# Patient Record
Sex: Female | Born: 1955 | Race: Black or African American | Hispanic: No | Marital: Single | Smoking: Current every day smoker
Health system: Southern US, Community
[De-identification: ages and names within clinical notes are randomized; demographics above are authoritative.]

## PROBLEM LIST (undated history)

## (undated) DIAGNOSIS — Q860 Fetal alcohol syndrome (dysmorphic): Secondary | ICD-10-CM

## (undated) DIAGNOSIS — K859 Acute pancreatitis without necrosis or infection, unspecified: Secondary | ICD-10-CM

## (undated) DIAGNOSIS — I10 Essential (primary) hypertension: Secondary | ICD-10-CM

## (undated) DIAGNOSIS — T7840XA Allergy, unspecified, initial encounter: Secondary | ICD-10-CM

## (undated) DIAGNOSIS — F319 Bipolar disorder, unspecified: Secondary | ICD-10-CM

## (undated) DIAGNOSIS — IMO0002 Reserved for concepts with insufficient information to code with codable children: Secondary | ICD-10-CM

## (undated) DIAGNOSIS — K219 Gastro-esophageal reflux disease without esophagitis: Secondary | ICD-10-CM

## (undated) DIAGNOSIS — F1910 Other psychoactive substance abuse, uncomplicated: Secondary | ICD-10-CM

## (undated) DIAGNOSIS — F419 Anxiety disorder, unspecified: Secondary | ICD-10-CM

## (undated) HISTORY — DX: Gastro-esophageal reflux disease without esophagitis: K21.9

## (undated) HISTORY — DX: Reserved for concepts with insufficient information to code with codable children: IMO0002

## (undated) HISTORY — DX: Other psychoactive substance abuse, uncomplicated: F19.10

## (undated) HISTORY — DX: Bipolar disorder, unspecified: F31.9

## (undated) HISTORY — DX: Allergy, unspecified, initial encounter: T78.40XA

## (undated) HISTORY — PX: NO PAST SURGERIES: SHX2092

## (undated) HISTORY — DX: Anxiety disorder, unspecified: F41.9

---
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
## 2003-02-25 ENCOUNTER — Encounter: Payer: Self-pay | Admitting: Emergency Medicine

## 2003-02-25 ENCOUNTER — Inpatient Hospital Stay (HOSPITAL_COMMUNITY): Admission: AD | Admit: 2003-02-25 | Discharge: 2003-02-25 | Payer: Self-pay | Admitting: *Deleted

## 2003-03-01 ENCOUNTER — Emergency Department (HOSPITAL_COMMUNITY): Admission: EM | Admit: 2003-03-01 | Discharge: 2003-03-01 | Payer: Self-pay | Admitting: Emergency Medicine

## 2005-06-20 ENCOUNTER — Emergency Department (HOSPITAL_COMMUNITY): Admission: EM | Admit: 2005-06-20 | Discharge: 2005-06-20 | Payer: Self-pay | Admitting: Emergency Medicine

## 2006-09-07 ENCOUNTER — Ambulatory Visit (HOSPITAL_COMMUNITY): Admission: RE | Admit: 2006-09-07 | Discharge: 2006-09-07 | Payer: Self-pay | Admitting: Internal Medicine

## 2006-09-07 ENCOUNTER — Ambulatory Visit: Payer: Self-pay | Admitting: Internal Medicine

## 2006-09-14 ENCOUNTER — Ambulatory Visit: Payer: Self-pay | Admitting: Internal Medicine

## 2006-12-21 ENCOUNTER — Ambulatory Visit: Payer: Self-pay | Admitting: Internal Medicine

## 2007-02-22 ENCOUNTER — Ambulatory Visit: Payer: Self-pay | Admitting: Internal Medicine

## 2007-08-02 DIAGNOSIS — G44209 Tension-type headache, unspecified, not intractable: Secondary | ICD-10-CM | POA: Insufficient documentation

## 2007-08-02 DIAGNOSIS — I10 Essential (primary) hypertension: Secondary | ICD-10-CM | POA: Insufficient documentation

## 2007-08-02 HISTORY — DX: Tension-type headache, unspecified, not intractable: G44.209

## 2007-12-11 ENCOUNTER — Ambulatory Visit: Payer: Self-pay | Admitting: Family Medicine

## 2007-12-13 ENCOUNTER — Ambulatory Visit: Payer: Self-pay | Admitting: Internal Medicine

## 2008-07-13 ENCOUNTER — Emergency Department (HOSPITAL_COMMUNITY): Admission: EM | Admit: 2008-07-13 | Discharge: 2008-07-13 | Payer: Self-pay | Admitting: Emergency Medicine

## 2008-08-19 ENCOUNTER — Telehealth (INDEPENDENT_AMBULATORY_CARE_PROVIDER_SITE_OTHER): Payer: Self-pay | Admitting: Internal Medicine

## 2008-08-19 ENCOUNTER — Encounter (INDEPENDENT_AMBULATORY_CARE_PROVIDER_SITE_OTHER): Payer: Self-pay | Admitting: Internal Medicine

## 2008-09-09 ENCOUNTER — Encounter (INDEPENDENT_AMBULATORY_CARE_PROVIDER_SITE_OTHER): Payer: Self-pay | Admitting: *Deleted

## 2009-08-02 ENCOUNTER — Emergency Department (HOSPITAL_COMMUNITY): Admission: EM | Admit: 2009-08-02 | Discharge: 2009-08-03 | Payer: Self-pay | Admitting: Emergency Medicine

## 2009-08-25 ENCOUNTER — Ambulatory Visit: Payer: Self-pay | Admitting: Internal Medicine

## 2009-08-25 DIAGNOSIS — K859 Acute pancreatitis without necrosis or infection, unspecified: Secondary | ICD-10-CM | POA: Insufficient documentation

## 2009-08-25 DIAGNOSIS — N390 Urinary tract infection, site not specified: Secondary | ICD-10-CM

## 2009-08-26 ENCOUNTER — Telehealth (INDEPENDENT_AMBULATORY_CARE_PROVIDER_SITE_OTHER): Payer: Self-pay | Admitting: Internal Medicine

## 2009-09-25 ENCOUNTER — Ambulatory Visit: Payer: Self-pay | Admitting: Internal Medicine

## 2009-10-26 ENCOUNTER — Ambulatory Visit: Payer: Self-pay | Admitting: Internal Medicine

## 2009-10-26 LAB — CONVERTED CEMR LAB
BUN: 13 mg/dL (ref 6–23)
CO2: 25 meq/L (ref 19–32)
Glucose, Bld: 70 mg/dL (ref 70–99)
Potassium: 4.2 meq/L (ref 3.5–5.3)
Sodium: 142 meq/L (ref 135–145)

## 2009-11-24 ENCOUNTER — Ambulatory Visit: Payer: Self-pay | Admitting: Internal Medicine

## 2009-11-24 DIAGNOSIS — M25519 Pain in unspecified shoulder: Secondary | ICD-10-CM

## 2009-11-24 LAB — CONVERTED CEMR LAB
ALT: 8 units/L (ref 0–35)
Albumin: 4.6 g/dL (ref 3.5–5.2)
Alkaline Phosphatase: 80 units/L (ref 39–117)
BUN: 10 mg/dL (ref 6–23)
CO2: 29 meq/L (ref 19–32)
Creatinine, Ser: 0.87 mg/dL (ref 0.40–1.20)
Glucose, Bld: 87 mg/dL (ref 70–99)
HCT: 43 % (ref 36.0–46.0)
Hemoglobin: 14.2 g/dL (ref 12.0–15.0)
Ketones, urine, test strip: NEGATIVE
LDL Cholesterol: 101 mg/dL — ABNORMAL HIGH (ref 0–99)
Lymphs Abs: 3 10*3/uL (ref 0.7–4.0)
Monocytes Absolute: 1 10*3/uL (ref 0.1–1.0)
Monocytes Relative: 8 % (ref 3–12)
Neutro Abs: 8.3 10*3/uL — ABNORMAL HIGH (ref 1.7–7.7)
Neutrophils Relative %: 67 % (ref 43–77)
Nitrite: NEGATIVE
Platelets: 180 10*3/uL (ref 150–400)
Potassium: 3.7 meq/L (ref 3.5–5.3)
Protein, U semiquant: 30
RBC: 4.67 M/uL (ref 3.87–5.11)
RDW: 14 % (ref 11.5–15.5)
Specific Gravity, Urine: <1.005
Total Bilirubin: 0.7 mg/dL (ref 0.3–1.2)
Total CHOL/HDL Ratio: 3.2
Total Protein: 7.6 g/dL (ref 6.0–8.3)
Urobilinogen, UA: 0.2
VLDL: 14 mg/dL (ref 0–40)
WBC Urine, dipstick: NEGATIVE
WBC: 12.4 10*3/uL — ABNORMAL HIGH (ref 4.0–10.5)
pH: 6.5

## 2009-11-30 ENCOUNTER — Ambulatory Visit (HOSPITAL_COMMUNITY): Admission: RE | Admit: 2009-11-30 | Discharge: 2009-11-30 | Payer: Self-pay | Admitting: Internal Medicine

## 2009-12-08 ENCOUNTER — Encounter (INDEPENDENT_AMBULATORY_CARE_PROVIDER_SITE_OTHER): Payer: Self-pay | Admitting: Internal Medicine

## 2009-12-23 ENCOUNTER — Encounter (INDEPENDENT_AMBULATORY_CARE_PROVIDER_SITE_OTHER): Payer: Self-pay | Admitting: Internal Medicine

## 2009-12-24 ENCOUNTER — Other Ambulatory Visit: Admission: RE | Admit: 2009-12-24 | Discharge: 2009-12-24 | Payer: Self-pay | Admitting: Internal Medicine

## 2009-12-24 ENCOUNTER — Ambulatory Visit: Payer: Self-pay | Admitting: Internal Medicine

## 2009-12-24 DIAGNOSIS — F329 Major depressive disorder, single episode, unspecified: Secondary | ICD-10-CM

## 2009-12-24 DIAGNOSIS — D72829 Elevated white blood cell count, unspecified: Secondary | ICD-10-CM | POA: Insufficient documentation

## 2009-12-24 DIAGNOSIS — R3129 Other microscopic hematuria: Secondary | ICD-10-CM

## 2009-12-24 DIAGNOSIS — H547 Unspecified visual loss: Secondary | ICD-10-CM

## 2009-12-24 DIAGNOSIS — IMO0002 Reserved for concepts with insufficient information to code with codable children: Secondary | ICD-10-CM

## 2009-12-24 LAB — CONVERTED CEMR LAB
Bilirubin Urine: NEGATIVE
Glucose, Urine, Semiquant: NEGATIVE
Nitrite: NEGATIVE
OCCULT 2: NEGATIVE
Protein, U semiquant: >=300
Rapid HIV Screen: NEGATIVE
Urobilinogen, UA: 0.2
pH: 5

## 2009-12-25 ENCOUNTER — Ambulatory Visit (HOSPITAL_COMMUNITY): Admission: RE | Admit: 2009-12-25 | Discharge: 2009-12-25 | Payer: Self-pay | Admitting: Internal Medicine

## 2010-01-09 LAB — CONVERTED CEMR LAB
Eosinophils Relative: 2 % (ref 0–5)
GC Probe Amp, Genital: NEGATIVE
MCV: 91.2 fL (ref 78.0–100.0)
Monocytes Absolute: 0.6 10*3/uL (ref 0.1–1.0)
Monocytes Relative: 6 % (ref 3–12)
Neutro Abs: 6.8 10*3/uL (ref 1.7–7.7)
Neutrophils Relative %: 63 % (ref 43–77)
Pap Smear: NEGATIVE
Platelets: 186 10*3/uL (ref 150–400)
RDW: 14.1 % (ref 11.5–15.5)
RPR Ser Ql: REACTIVE — AB
RPR Titer: 1:16 {titer} — AB
T pallidum Antibodies (TP-PA): 35.2 — ABNORMAL HIGH (ref <1.0)
WBC: 10.8 10*3/uL — ABNORMAL HIGH (ref 4.0–10.5)

## 2010-01-11 ENCOUNTER — Ambulatory Visit: Payer: Self-pay | Admitting: Internal Medicine

## 2010-01-31 ENCOUNTER — Telehealth (INDEPENDENT_AMBULATORY_CARE_PROVIDER_SITE_OTHER): Payer: Self-pay | Admitting: Internal Medicine

## 2010-02-02 ENCOUNTER — Encounter (INDEPENDENT_AMBULATORY_CARE_PROVIDER_SITE_OTHER): Payer: Self-pay | Admitting: Internal Medicine

## 2010-02-08 ENCOUNTER — Ambulatory Visit: Payer: Self-pay | Admitting: Internal Medicine

## 2010-02-14 DIAGNOSIS — A539 Syphilis, unspecified: Secondary | ICD-10-CM | POA: Insufficient documentation

## 2010-02-14 LAB — CONVERTED CEMR LAB
Bilirubin Urine: NEGATIVE
Glucose, Urine, Semiquant: NEGATIVE
Ketones, urine, test strip: NEGATIVE
Nitrite: NEGATIVE
Specific Gravity, Urine: >=1.03
Urobilinogen, UA: 0.2

## 2010-02-15 ENCOUNTER — Encounter (INDEPENDENT_AMBULATORY_CARE_PROVIDER_SITE_OTHER): Payer: Self-pay | Admitting: Internal Medicine

## 2010-02-18 ENCOUNTER — Encounter (INDEPENDENT_AMBULATORY_CARE_PROVIDER_SITE_OTHER): Payer: Self-pay | Admitting: Internal Medicine

## 2010-02-24 ENCOUNTER — Ambulatory Visit (HOSPITAL_COMMUNITY): Admission: RE | Admit: 2010-02-24 | Discharge: 2010-02-24 | Payer: Self-pay | Admitting: Internal Medicine

## 2010-03-03 ENCOUNTER — Ambulatory Visit: Payer: Self-pay | Admitting: Internal Medicine

## 2010-03-04 ENCOUNTER — Encounter (INDEPENDENT_AMBULATORY_CARE_PROVIDER_SITE_OTHER): Payer: Self-pay | Admitting: Internal Medicine

## 2010-03-04 DIAGNOSIS — D4120 Neoplasm of uncertain behavior of unspecified ureter: Secondary | ICD-10-CM

## 2010-03-04 DIAGNOSIS — D4100 Neoplasm of uncertain behavior of unspecified kidney: Secondary | ICD-10-CM

## 2010-03-05 ENCOUNTER — Encounter (INDEPENDENT_AMBULATORY_CARE_PROVIDER_SITE_OTHER): Payer: Self-pay | Admitting: Internal Medicine

## 2010-03-23 ENCOUNTER — Ambulatory Visit: Payer: Self-pay | Admitting: Internal Medicine

## 2010-03-24 ENCOUNTER — Ambulatory Visit (HOSPITAL_COMMUNITY): Admission: RE | Admit: 2010-03-24 | Discharge: 2010-03-24 | Payer: Self-pay | Admitting: Internal Medicine

## 2010-03-24 ENCOUNTER — Encounter (INDEPENDENT_AMBULATORY_CARE_PROVIDER_SITE_OTHER): Payer: Self-pay | Admitting: Internal Medicine

## 2010-03-24 LAB — CONVERTED CEMR LAB: Creatinine, Ser: 0.95 mg/dL (ref 0.40–1.20)

## 2010-04-03 ENCOUNTER — Telehealth (INDEPENDENT_AMBULATORY_CARE_PROVIDER_SITE_OTHER): Payer: Self-pay | Admitting: Internal Medicine

## 2010-04-03 ENCOUNTER — Encounter (INDEPENDENT_AMBULATORY_CARE_PROVIDER_SITE_OTHER): Payer: Self-pay | Admitting: Internal Medicine

## 2010-06-03 ENCOUNTER — Telehealth (INDEPENDENT_AMBULATORY_CARE_PROVIDER_SITE_OTHER): Payer: Self-pay | Admitting: *Deleted

## 2010-07-01 ENCOUNTER — Ambulatory Visit: Payer: Self-pay | Admitting: Internal Medicine

## 2010-07-01 DIAGNOSIS — R1013 Epigastric pain: Secondary | ICD-10-CM

## 2010-07-01 LAB — CONVERTED CEMR LAB: Amylase: 63 units/L (ref 0–105)

## 2010-12-09 NOTE — Assessment & Plan Note (Signed)
Summary: STOMACH HURTS///KT   Vital Signs:  Patient profile:   55 year old female Height:      64 inches Weight:      103.6 pounds BMI:     17.85 O2 Sat:      98 % on Room air Temp:     98.4 degrees F oral Pulse rate:   59 / minute Pulse rhythm:   regular Resp:     18 per minute BP sitting:   142 / 100  (right arm) Cuff size:   regular  Vitals Entered By: Michelle Nasuti (July 01, 2010 9:58 AM)  O2 Flow:  Room air CC: pt presents to clinic for follow up on upper abd. pain. Pt states she was seen in ER in [facility] last week, Transvag pelvic ultrasound was done. pt unsure of results or discharging dx ?hematuria. Given Rx for abx which pt did complete Pain Assessment Patient in pain? no       Does patient need assistance? Functional Status Self care Ambulation Normal    CC:  pt presents to clinic for follow up on upper abd. pain. Pt states she was seen in ER in [facility] last week and Transvag pelvic ultrasound was done. pt unsure of results or discharging dx ?hematuria. Given Rx for abx which pt did complete.  History of Present Illness: 1.  Epigastric Pain:  pt.  states she was having pain starting 2 weeks ago--constant, pinching pain.  Pt. states she was seen by Urology at [facility] soon after.  Sounds like she had cystoscopy and was told everything was fine.  Was ultimately given an antibiotic and soon after starting antibiotic (pt. states just 3 hours after first dose)  her pain went away.  Pt states she is not aware of being diagnosed as having a UTI.  Pt. had similar pain previously with elevated lipase.  Has not had any pain since I believe 06/25/10--sounds like this is when she had the procedure and given one dose of antibiotic.  Pt. states pain was similar to that suffered in 08/2009--had mild elevation in Lipase at the time, but abdominal ultrasound did not show pancreatic abnormality, though suboptimal study of pancreas.  Has since had an abdominal MRI that showed  no pancreatic abnormality.  NIece also states pt. had nausea and vomiting with the abdominal pain.  Pt. states has heartburn every 2 weeks or so--not frequent.  Does smoke rarely.  No alcohol.   Does use aspirin--not recent.  Not much in way of onions or tomatoes.  Little caffeine.  Eats a lot of chocolate at times.  2.  Microscopic hematuria:  has repeat CT scan with [facility] on 12/29/10--most likely to follow up on right renal lesion.  Reportedly Urologic work up was fine per pt. and family.  3.  Hypertension:  not taking meds regularly.  Niece trying to get pt. to take--has marked bottles for her.  Would be willing to set up a pill box.  Allergies: 1)  * Tomatoes 2)  * Chocolate  Physical Exam  General:  NAD Lungs:  Normal respiratory effort, chest expands symmetrically. Lungs are clear to auscultation, no crackles or wheezes. Heart:  Normal rate and regular rhythm. S1 and S2 normal without gallop, murmur, click, rub or other extra sounds.  Radial pulses normal and equal Abdomen:  soft, normal bowel sounds, no masses, no hepatomegaly, and no splenomegaly.  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 to take meds Her updated medication list for this problem includes:    Metoprolol Tartrate 25 Mg Tabs (Metoprolol tartrate) .Marland Kitchen... 1 tab by mouth two times a day    Hydrochlorothiazide 25 Mg Tabs (Hydrochlorothiazide) .Marland Kitchen... 1 by mouth once daily  Complete Medication List: 1)  Metoprolol Tartrate 25 Mg Tabs (Metoprolol tartrate) .Marland Kitchen.. 1 tab by mouth  two times a day 2)  Hydrochlorothiazide 25 Mg Tabs (Hydrochlorothiazide) .Marland Kitchen.. 1 by mouth once daily 3)  Famotidine 40 Mg Tabs (Famotidine) .Marland Kitchen.. 1 tab by mouth at bedtime Prescriptions: HYDROCHLOROTHIAZIDE 25 MG TABS (HYDROCHLOROTHIAZIDE) 1 by mouth once daily  #30 x 11   Entered and Authorized by:   Julieanne Manson MD   Signed by:   Julieanne Manson MD on 07/01/2010   Method used:   [facility]ron[facility] to        [facility] [address] [id number]* (retail)       [address]       [place], [place]  [address]       Ph: [phone] or [phone]       Fax: [phone]   RxID:   [id number] METOPROLOL TARTRATE 25 MG  TABS (METOPROLOL TARTRATE) 1 tab by mouth two times a day  #60 x 11   Entered and Authorized by:   Julieanne Manson MD   Signed by:   Julieanne Manson MD on 07/01/2010   Method used:   [facility]        [facility] [address] [id number][facility] (retail)       [address]       [place], [place]  [address]       Ph: [phone] or [phone]       Fax: [phone]   RxID:   [id number] FAMOTIDINE 40 MG TABS (FAMOTIDINE) 1 tab by mouth at bedtime  #30 x 11   Entered and Authorized by:   Julieanne Manson MD   Signed by:   Julieanne Manson MD on 07/01/2010   Method used:   [facility]ronically to        [facility] [address] [id number]* (retail)       [address]       [place], [place]  [address]       Ph: [phone] or [phone]       Fax: [phone]   RxID:   [id number]   Prevention & Chronic Care Immunizations   Influenza vaccine: Not documented    Tetanus booster: 09/07/2006: Historical    Pneumococcal vaccine: Not documented  Colorectal Screening   Hemoccult: Not documented    Colonoscopy: Not documented  Other Screening   Pap smear: NEGATIVE FOR INTRAEPITHELIAL LESIONS OR MALIGNANCY.  (12/24/2009)    Mammogram: ASSESSMENT: Negative - BI-RADS 1^MM DIGITAL SCREENING   (12/25/2009)   Smoking status: current  (12/24/2009)  Lipids   Total Cholesterol: 168  (11/24/2009)   LDL: 101  (11/24/2009)   LDL Direct: Not documented   HDL: 53  (11/24/2009)   Triglycerides: 68  (11/24/2009)  Hypertension   Last Blood Pressure: 142 / 100  (07/01/2010)   Serum creatinine: 0.95  (03/24/2010)   Serum potassium 4.1  (03/24/2010)  Self-Management Support :    Hypertension self-management support: Not documented

## 2010-12-09 NOTE — Progress Notes (Signed)
Summary: Surgical Depatment  Phone Note From Other Clinic   Summary of Call: Cristine, from [facility]  just called because they need the actual film (MRI)  for abdomal and pelvis to be mail to Dpt or Urology [address]  [facility] [address] [place] [address] attention to  [place] or Zella Ball.  If you have any further question plaease do not hesitat to call back to christine at the [phone].  Mulberry MD  Initial call taken by: Manon Hilding,  June 03, 2010 9:16 AM  Follow-up for Phone Call        Sheila/Tiffany--just came across this before seeing pt--was this taken care of?  If so, please document  Follow-up by: Julieanne Manson MD,  July 01, 2010 10:01 AM  Additional Follow-up for Phone Call Additional follow up Details #1::        I do not know, first time I am seeing, will check with [facility] Additional Follow-up by: Vesta Mixer CMA,  July 01, 2010 10:16 AM    Additional Follow-up for Phone Call Additional follow up Details #2::    WE DON'T HAVE ABLE FILM SHE WILL NEED TO CALL WERE IMAGE WAS DONE Follow-up by: Arta Bruce,  July 19, 2010 11:20 AM

## 2010-12-09 NOTE — Letter (Signed)
Summary: [facility]//NO RECORDS FOUND  [facility]//NO RECORDS FOUND   Imported By: Arta Bruce 10/04/2010 16:28:52  _____________________________________________________________________  External Attachment:    Type:   Image     Comment:   External Document

## 2010-12-09 NOTE — Letter (Signed)
Summary: TEST ORDER FORM//ULTRASOUND//APPT DATE & TIME  TEST ORDER FORM//ULTRASOUND//APPT DATE & TIME   Imported By: Arta Bruce 01/06/2010 12:02:52  _____________________________________________________________________  External Attachment:    Type:   Image     Comment:   External Document

## 2010-12-09 NOTE — Assessment & Plan Note (Signed)
Summary: CPP/////KT/hemmocult card results   Vital Signs:  Patient profile:   55 year old female Weight:      104 pounds Temp:     97.5 degrees F Pulse rate:   76 / minute Pulse rhythm:   regular Resp:     20 per minute BP sitting:   139 / 86  (left arm) Cuff size:   regular  Vitals Entered By: Vesta Mixer CMA (December 24, 2009 10:49 AM)  History of Present Illness: 55 yo female here for CPP:  No definite concerns.  Habits & Providers  Alcohol-Tobacco-Diet     Alcohol drinks/day: <1--rare     Tobacco Status: current     Cigarette Packs/Day: 1/3 ppd     Year Started: age 22  Exercise-Depression-Behavior     Drug Use: crack cocaine--last used ealier this week.  States using ever now and then.  Allergies (verified): 1)  * Tomatoes 2)  * Chocolate  Past History:  Past Medical History: FETAL ALCOHOL SYNDROME (ICD-760.71) MICROSCOPIC HEMATURIA (ICD-599.72) SHOULDER PAIN, RIGHT (ICD-719.41) HEALTH SCREENING (ICD-V70.0) ENCOUNTER FOR LONG-TERM USE OF OTHER MEDICATIONS (ICD-V58.69) PANCREATITIS (ICD-577.0) URINARY TRACT INFECTION (ICD-599.0) HEADACHE, TENSION (ICD-307.81) HYPERTENSION (ICD-401.9)  Past Surgical History: None  Family History: Mother, died 77:  Alcoholism, died of breast cancer Father, late 2s, does not know him well. 2 Sisters, 34:  Hypertension and 38:  healthy as far as known 2 Brothers, 16:  incarcerated--unknown health,  70:  hypertension, DM Daughter, 64:  Healthy  Social History: Lives alone.   Never married Niece is power of attorney--takes care of finances Gets SSI --disabled secondary to MRPacks/Day:  1/3 ppd Drug Use:  crack cocaine--last used ealier this week.  States using ever now and then.  Review of Systems General:  Energy okay. Eyes:  Complains of blurring; Has not tried reading glasses.  Has not been to an eye doctor. ENT:  Denies decreased hearing. CV:  Denies chest pain or discomfort and palpitations. Resp:  Denies  shortness of breath; Occasionally gets SOB going up steps. GI:  Denies abdominal pain, bloody stools, constipation, dark tarry stools, and diarrhea. GU:  Complains of nocturia; denies dysuria; 3 times nightly. MS:  Denies joint pain, joint redness, and joint swelling. Derm:  Denies lesion(s) and rash. Neuro:  Denies numbness, tingling, and weakness. Psych:  irritable and angry at times--Niece worried she might be bipolar.  She is not aware of that running in the family.Marland Kitchen  Physical Exam  General:  Well-developed,well-nourished,in no acute distress; alert,appropriate and cooperative throughout examination Head:  Normocephalic and atraumatic without obvious abnormalities. No apparent alopecia or balding. Eyes:  No corneal or conjunctival inflammation noted. EOMI. Perrla. Funduscopic exam benign, without hemorrhages, exudates or papilledema. Vision grossly normal. Ears:  External ear exam shows no significant lesions or deformities.  Otoscopic examination reveals clear canals, tympanic membranes are intact bilaterally without bulging, retraction, inflammation or discharge. Hearing is grossly normal bilaterally. Nose:  External nasal examination shows no deformity or inflammation. Nasal mucosa are pink and moist without lesions or exudates. Mouth:  Oral mucosa and oropharynx without lesions or exudates. poor dentition--teeth loose and missing.   Neck:  No deformities, masses, or tenderness noted. Breasts:  No mass, nodules, thickening, tenderness, bulging, retraction, inflamation, nipple discharge or skin changes noted.   Lungs:  Normal respiratory effort, chest expands symmetrically. Lungs are clear to auscultation, no crackles or wheezes. Heart:  Normal rate and regular rhythm. S1 and S2 normal without gallop, murmur, click, rub or other  extra sounds. Abdomen:  Bowel sounds positive,abdomen soft and non-tender without masses, organomegaly or hernias noted. Rectal:  No external abnormalities noted.  Normal sphincter tone. No rectal masses or tenderness.  Heme negative light brown stool. Genitalia:  Pelvic Exam:        External: normal female genitalia without lesions or masses        Vagina: normal without lesions or masses        Cervix: normal without lesions or masses        Adnexa: normal bimanual exam without masses or fullness        Uterus: normal by palpation        Pap smear: performed Msk:  No deformity or scoliosis noted of thoracic or lumbar spine.   Pulses:  R and L carotid,radial,femoral,dorsalis pedis and posterior tibial pulses are full and equal bilaterally Extremities:  No clubbing, cyanosis, edema, or deformity noted with normal full range of motion of all joints.   Neurologic:  No cranial nerve deficits noted. Station and gait are normal. Plantar reflexes are down-going bilaterally. DTRs are symmetrical throughout. Sensory, motor and coordinative functions appear intact. Skin:  Intact without suspicious lesions or rashes Cervical Nodes:  No lymphadenopathy noted Axillary Nodes:  No palpable lymphadenopathy Inguinal Nodes:  No significant adenopathy Psych:  Cognition and judgment appear fairly intact. Alert and cooperative with normal attention span and concentration. No apparent delusions, illusions, hallucinations   Impression & Recommendations:  Problem # 1:  ROUTINE GYNECOLOGICAL EXAMINATION (ICD-V72.31) Mammogram scheduled Orders: KOH/ WET Mount [phone]) Pap Smear, Thin Prep ( Collection of) [phone]) UA Dipstick w/o Micro (automated)  (81003) T- GC Chlamydia (13086) T-Pap Smear, Thin Prep (57846) T-HIV Antibody  (Reflex) ([id number]) T-Syphilis Test (RPR) (24401-02725)  Problem # 2:  VISUAL ACUITY, DECREASED (ICD-369.9)  Orders: Ophthalmology Referral (Ophthalmology)  Problem # 3:  DEPRESSION (ICD-311) With cocaine abuse at times as well as anger issues Orders: Psychology Referral (Psychology)  [facility]  Problem # 4:  MICROSCOPIC HEMATURIA  (ICD-599.72)  The following medications were removed from the medication list:    Nitrofurantoin Macrocrystal 100 Mg Caps (Nitrofurantoin macrocrystal) .Marland Kitchen... 1 by mouth two times a day x 7 days  Orders: T-Culture, Urine ([id number])  Problem # 5:  HYPERTENSION (ICD-401.9) Fair control. Her updated medication list for this problem includes:    Metoprolol Tartrate 25 Mg Tabs (Metoprolol tartrate) .Marland Kitchen... 1 tab by mouth two times a day    Hydrochlorothiazide 25 Mg Tabs (Hydrochlorothiazide) .Marland Kitchen... 1 by mouth once daily  Complete Medication List: 1)  Metoprolol Tartrate 25 Mg Tabs (Metoprolol tartrate) .Marland Kitchen.. 1 tab by mouth two times a day 2)  Hydrochlorothiazide 25 Mg Tabs (Hydrochlorothiazide) .Marland Kitchen.. 1 by mouth once daily  Other Orders: T-CBC w/Diff ([id number])  Patient Instructions: 1)  Follow up with Dr. Delrae Alfred in 6 months--hypertension 2)  Referral to [facility] for depressiona and Crack cocaine use   Preventive Care Screening  Prior Values:    Last Tetanus Booster:  Historical (09/07/2006)     Pap:  Long time ago--cannot recall if ever abnormal. Mammogram:  scheduled for tomorrow. SBE:  Daily in shower Guaiac Cards:  Never. Colonscopy:  never. Osteoprevention:  States have 6 servings of cheese and milk daily.  Walks about 1 mile 2 times weekly.   Laboratory Results   Urine Tests    Routine Urinalysis   Glucose: negative   (Normal Range: Negative) Bilirubin: negative   (Normal Range: Negative) Ketone: trace (5)   (Normal Range: Negative)  Spec. Gravity: >=1.030   (Normal Range: 1.003-1.035) Blood: moderate   (Normal Range: Negative) pH: 5.0   (Normal Range: 5.0-8.0) Protein: >=300   (Normal Range: Negative) Urobilinogen: 0.2   (Normal Range: 0-1) Nitrite: negative   (Normal Range: Negative) Leukocyte Esterace: negative   (Normal Range: Negative)    Date/Time Received: December 29, 2009 5:30 PM   [employer] Source: vaginal WBC/hpf: 1-5 Bacteria/hpf:  2+ Clue cells/hpf: few  Positive whiff Yeast/hpf: none Wet Mount KOH: Negative Trichomonas/hpf: none  Other Tests  Rapid HIV: negative  Stool - Occult Blood Hemmoccult #1: negative Date: 12/29/2009 Hemoccult #2: negative Date: 12/29/2009 Hemoccult #3: negative Date: 12/29/2009 Comments: Whiff very faint    Laboratory Results   Urine Tests    Routine Urinalysis   Glucose: negative   (Normal Range: Negative) Bilirubin: negative   (Normal Range: Negative) Ketone: trace (5)   (Normal Range: Negative) Spec. Gravity: >=1.030   (Normal Range: 1.003-1.035) Blood: moderate   (Normal Range: Negative) pH: 5.0   (Normal Range: 5.0-8.0) Protein: >=300   (Normal Range: Negative) Urobilinogen: 0.2   (Normal Range: 0-1) Nitrite: negative   (Normal Range: Negative) Leukocyte Esterace: negative   (Normal Range: Negative)      Wet Mount/KOH  Positive whiff  Other Tests  Rapid HIV: negative Comments: Whiff very faint

## 2010-12-09 NOTE — Letter (Signed)
Summary: med/solutions/approved  med/solutions/approved   Imported By: Arta Bruce 04/21/2010 11:25:47  _____________________________________________________________________  External Attachment:    Type:   Image     Comment:   External Document

## 2010-12-09 NOTE — Letter (Signed)
Summary: *HSN Results Follow up  [facility]  [address] [place], [place] [address]   Phone: [phone]  Fax: [phone]      12/08/2009   [facility] [address] [place], [place]  [address]   Dear  [name],                            ____S.Drinkard,FNP   ____D. Gore,FNP       ____B. McPherson,MD   ____V. Rankins,MD    __X__E. Danyla Wattley,MD    ____N. Daphine Deutscher, FNP  ____D. Reche Dixon, MD    ____K. Philipp Deputy, MD    ____Other     This letter is to inform you that your recent test(s):  _______Pap Smear    _______Lab Test     ___X____X-ray    ____X___ is within acceptable limits  _______ requires a medication change  _______ requires a follow-up lab visit  _______ requires a follow-up visit with your provider   Comments:  Ultrasound was okay of your pancreas       _________________________________________________________ If you have any questions, please contact our office                     Sincerely,  Roberta Manson MD [facility]

## 2010-12-09 NOTE — Letter (Signed)
Summary: MED/SOLUTIONS//APPROVED  MED/SOLUTIONS//APPROVED   Imported By: Arta Bruce 02/08/2010 16:47:18  _____________________________________________________________________  External Attachment:    Type:   Image     Comment:   External Document

## 2010-12-09 NOTE — Assessment & Plan Note (Signed)
Summary: 3 MONTH FU////KT   Vital Signs:  Patient profile:   55 year old female Weight:      107.1 pounds Temp:     97.1 degrees F Pulse rate:   76 / minute Pulse rhythm:   regular Resp:     18 per minute BP sitting:   106 / 78  (left arm) Cuff size:   regular  Vitals Entered By: Vesta Mixer CMA (November 24, 2009 10:35 AM) CC: 3 month f/u, her right arm aches sometimes and wants to know if she should take some vitamins to help her gain weight.  She has been taking her metoprolol 2 qd instead of 1 bid  Is Patient Diabetic? No Pain Assessment Patient in pain? no       Does patient need assistance? Ambulation Normal   CC:  3 month f/u and her right arm aches sometimes and wants to know if she should take some vitamins to help her gain weight.  She has been taking her metoprolol 2 qd instead of 1 bid .  History of Present Illness: 1.  Hypertension:  Tolerating meds fine--no light headedness.  No fatigue.  Good energy.  Recent BMET was normal.  2.  Elevated Lipase in ED with epigastric pain--see previous note.  This info gleaned from ED report after pt. here last.  Pain has not recurred.  Pt. states this was the first episode of the pain.  Sharp pain in epigastrium--worse with eating--vomited.  No radiation, but worse when lay back.  Symptoms lasted for 1 week.  No change in BMs--color or consistency.  Pt. drinks alcohol only 3-4 times yearly--only with holidays.  Pt. states she was an "alcoholic baby"  -- mother was an alcoholic--including during pregnancy with pt--so she does not drink much.  Denies drinking at all before GI symptoms started.  3.  Right arm pain:  started last week.  Hurts mostly when awakens in morning--pt. sleeps on that side.  If does not sleep on that shoulder, does not hurt.  Cannot recall an injury.  May have overused, but pt. not clear as to how she may have.  Good grip with right hand.  No weakness, numbness or tingling in that arm.    Allergies: No Known  Drug Allergies  Physical Exam  General:  NAD, thin female Mouth:  poor dentition and teeth missing.   Lungs:  Normal respiratory effort, chest expands symmetrically. Lungs are clear to auscultation, no crackles or wheezes. Heart:  Normal rate and regular rhythm. S1 and S2 normal without gallop, murmur, click, rub or other extra sounds.  Radial pulses normal and equal Abdomen:  Bowel sounds positive,abdomen soft and non-tender without masses, organomegaly or hernias noted.   Impression & Recommendations:  Problem # 1:  HYPERTENSION (ICD-401.9) Controlled Her updated medication list for this problem includes:    Metoprolol Tartrate 25 Mg Tabs (Metoprolol tartrate) .Marland Kitchen... 1 tab by mouth two times a day    Hydrochlorothiazide 25 Mg Tabs (Hydrochlorothiazide) .Marland Kitchen... 1 by mouth once daily  Orders: T-CBC w/Diff ([id number]) UA Dipstick w/o Micro (manual) (09811)  Problem # 2:  PANCREATITIS (ICD-577.0) Need to check for etiology. No signs or symptoms of continued problem Orders: T-CBC w/Diff ([id number]) T-Comprehensive Metabolic Panel [phone]) T-Lipid Profile ([id number]) Ultrasound (Ultrasound)  Problem # 3:  SHOULDER PAIN, RIGHT (ICD-719.41) Appears to be related to CC/AC joint--probably tendinitis. To avoid sleeping on the shoulder and see if resolves--if not, to call.  Complete Medication List: 1)  Metoprolol  Tartrate 25 Mg Tabs (Metoprolol tartrate) .Marland Kitchen.. 1 tab by mouth two times a day 2)  Metoclopramide Hcl 10 Mg Tabs (Metoclopramide hcl) .Marland Kitchen.. 1 by mouth q 6 hours as needed for nausea 3)  Nitrofurantoin Macrocrystal 100 Mg Caps (Nitrofurantoin macrocrystal) .Marland Kitchen.. 1 by mouth two times a day x 7 days 4)  Hydrochlorothiazide 25 Mg Tabs (Hydrochlorothiazide) .Marland Kitchen.. 1 by mouth once daily  Patient Instructions: 1)  CPP with Dr. Delrae Alfred next available. 2)  Call if shoulder pain does not go away--try not to sleep on it for 2 weeks.  Laboratory Results   Urine  Tests  Date/Time Received: November 24, 2009 11:15 AM  Date/Time Reported: November 24, 2009 11:15 AM   Routine Urinalysis   Color: lt. yellow Glucose: negative   (Normal Range: Negative) Bilirubin: negative   (Normal Range: Negative) Ketone: negative   (Normal Range: Negative) Spec. Gravity: <1.005   (Normal Range: 1.003-1.035) Blood: small   (Normal Range: Negative) pH: 6.5   (Normal Range: 5.0-8.0) Protein: 30   (Normal Range: Negative) Urobilinogen: 0.2   (Normal Range: 0-1) Nitrite: negative   (Normal Range: Negative) Leukocyte Esterace: negative   (Normal Range: Negative)

## 2010-12-09 NOTE — Letter (Signed)
Summary: REQUESTING RECORDS FROM [facility]  HD  REQUESTING RECORDS FROM [facility]  HD   Imported By: Arta Bruce 02/16/2010 15:42:33  _____________________________________________________________________  External Attachment:    Type:   Image     Comment:   External Document

## 2010-12-09 NOTE — Letter (Signed)
Summary: *Referral Letter  [facility]  [address] [place], [place] [address]   Phone: [phone]  Fax: [phone]    04/03/2010  Thank you in advance for agreeing to see my patient:  Roberta Miller [address] [place], [place]  [address]  Phone: [phone]  Reason for Referral: Microscopic hematuria on multiple samples over time.  Renal ultrasound showed a possible new lesion in right kidney (as compared to no abnormality in 11/2009). Follow up MRI did not support a right kidney lesion.  Urine cytology negative for malignancy. Ms. Wachtel is asymptomatic.  Procedures Requested: Urologic evaluation and recommendations.  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) 9)  MICROSCOPIC HEMATURIA (ICD-599.72) 10)  SHOULDER PAIN, RIGHT (ICD-719.41) 11)  HEALTH SCREENING (ICD-V70.0) 12)  ENCOUNTER FOR LONG-TERM USE OF OTHER MEDICATIONS (ICD-V58.69) 13)  PANCREATITIS (ICD-577.0) 14)  URINARY TRACT INFECTION (ICD-599.0) 15)  HEADACHE, TENSION (ICD-307.81) 16)  HYPERTENSION (ICD-401.9)   Current Medications: 1)  METOPROLOL TARTRATE 25 MG  TABS (METOPROLOL TARTRATE) 1 tab by mouth two times a day 2)  HYDROCHLOROTHIAZIDE 25 MG TABS (HYDROCHLOROTHIAZIDE) 1 by mouth once daily   Past Medical History: 1)  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  Sincerely,  Julieanne Manson MD

## 2010-12-09 NOTE — Progress Notes (Signed)
  Phone Note Outgoing Call   Summary of Call: Please have pt. come in for repeat UA--make sure she knows how to get a clean catch--make sure no period going on at the time--this is for microscopic hematuria. Initial call taken by: Julieanne Manson MD,  January 31, 2010 3:49 PM  Follow-up for Phone Call        Pt aware and will come in on Monday morning for UA and she no longer has a  period. Follow-up by: Vesta Mixer CMA,  February 03, 2010 12:44 PM

## 2010-12-09 NOTE — Letter (Signed)
Summary: TEST ORDER FORM//MRI//APPT DATE & TIME  TEST ORDER FORM//MRI//APPT DATE & TIME   Imported By: Arta Bruce 03/23/2010 14:35:12  _____________________________________________________________________  External Attachment:    Type:   Image     Comment:   External Document

## 2010-12-09 NOTE — Progress Notes (Signed)
Summary: urology referral and discussion with pt.  Phone Note Outgoing Call   Summary of Call: Called pt. and let her know results of urine cytology and MRI.  discussed I would like her to see a urologist at [facility].   She states we need to check with her niece, Meriam Sprague as to whether she can drive her over there:  phone: [phone]. Debra:  can you arrange for [facility] Urology referral after checking with Meriam Sprague as to whether she can get her there?  thanks.  Will need letter, order, UAs (do not get them out of flow sheet as those are incomplete UAs--need to get out of OV each time.  Ultrasound x2, MRI and urine cytology Initial call taken by: Julieanne Manson MD,  Apr 03, 2010 5:01 PM  Follow-up for Phone Call        Called and lt message for niece to contact regarding urology referral to [facility] Follow-up by: Candi Leash,  Apr 06, 2010 10:35 AM  Additional Follow-up for Phone Call Additional follow up Details #1::        BEVERLY STOPPED BY AND SAYS THAT HER DAUGHTER (CRYSTAL STAPLES) CAN TAKE Mirah NEXT WEEK ON TUES, WED, OR THURSDAY. BUT BEVERLY SAYS THAT IF ITS IN THE MORNIG TIME, SHE WILL BE ABLE TO GO/W HER DAUGHTER TO TAKE HER. Additional Follow-up by: Leodis Rains,  Apr 06, 2010 2:50 PM    Additional Follow-up for Phone Call Additional follow up Details #2::    Thanks Follow-up by: Julieanne Manson MD,  Apr 06, 2010 6:31 PM

## 2010-12-09 NOTE — Progress Notes (Signed)
Summary: Office Visit//DEPRESSION SCREENING  Office Visit//DEPRESSION SCREENING   Imported By: Arta Bruce 02/18/2010 12:21:28  _____________________________________________________________________  External Attachment:    Type:   Image     Comment:   External Document

## 2011-01-24 LAB — BASIC METABOLIC PANEL
BUN: 7 mg/dL (ref 6–23)
CO2: 29 mEq/L (ref 19–32)
Calcium: 9.6 mg/dL (ref 8.4–10.5)
Chloride: 104 mEq/L (ref 96–112)
Creatinine, Ser: 0.97 mg/dL (ref 0.4–1.2)
GFR calc non Af Amer: >60 mL/min (ref >60)
Glucose, Bld: 90 mg/dL (ref 70–99)
Potassium: 3.4 mEq/L — ABNORMAL LOW (ref 3.5–5.1)

## 2011-02-11 LAB — COMPREHENSIVE METABOLIC PANEL
Albumin: 4.3 g/dL (ref 3.5–5.2)
BUN: 13 mg/dL (ref 6–23)
CO2: 28 mEq/L (ref 19–32)
Calcium: 10 mg/dL (ref 8.4–10.5)
Creatinine, Ser: 0.84 mg/dL (ref 0.4–1.2)
GFR calc Af Amer: >60 mL/min (ref >60)
GFR calc non Af Amer: >60 mL/min (ref >60)
Glucose, Bld: 113 mg/dL — ABNORMAL HIGH (ref 70–99)

## 2011-02-11 LAB — CBC
Hemoglobin: 14.6 g/dL (ref 12.0–15.0)
MCHC: 33.2 g/dL (ref 30.0–36.0)
MCV: 91.9 fL (ref 78.0–100.0)
RBC: 4.76 MIL/uL (ref 3.87–5.11)
WBC: 13.1 10*3/uL — ABNORMAL HIGH (ref 4.0–10.5)

## 2011-02-11 LAB — URINALYSIS, ROUTINE W REFLEX MICROSCOPIC
Nitrite: POSITIVE — AB
Protein, ur: 100 mg/dL — AB

## 2011-02-11 LAB — URINE MICROSCOPIC-ADD ON

## 2011-02-11 LAB — URINE CULTURE

## 2011-02-11 LAB — DIFFERENTIAL
Basophils Relative: 0 % (ref 0–1)
Lymphs Abs: 2.4 10*3/uL (ref 0.7–4.0)
Monocytes Absolute: 0.5 10*3/uL (ref 0.1–1.0)

## 2011-06-26 IMAGING — US US ABDOMEN COMPLETE
1 series · 13 of 25 positions shown · non-contrast
Comparison: No prior ultrasound.

CLINICAL DATA: Epigastric abdominal pain.  Elevated lipase.
Pancreatitis.

COMPLETE ABDOMINAL ULTRASOUND 11/30/2009:

[Series 1: us abdomen complete · 0.22mm/px · 13 of 65 slices shown]
[im 1/65]
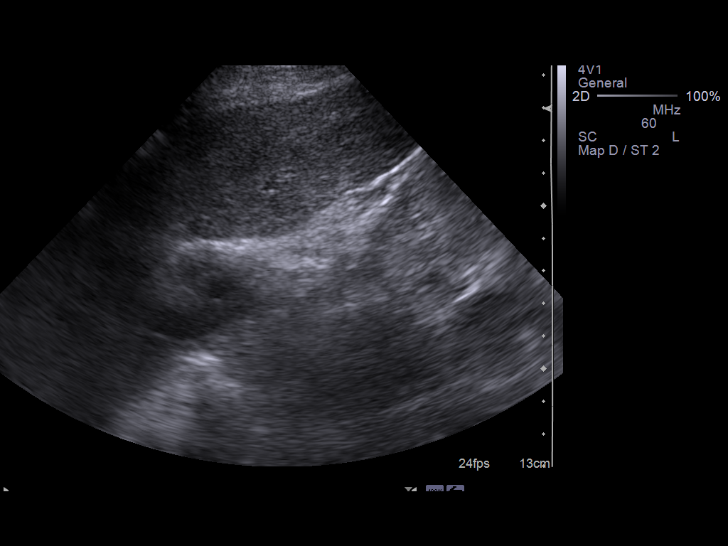
[im 6/65]
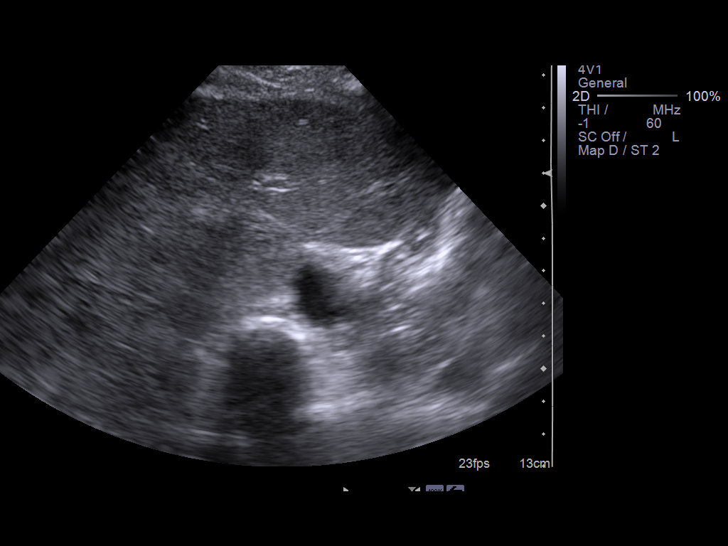
[im 11/65]
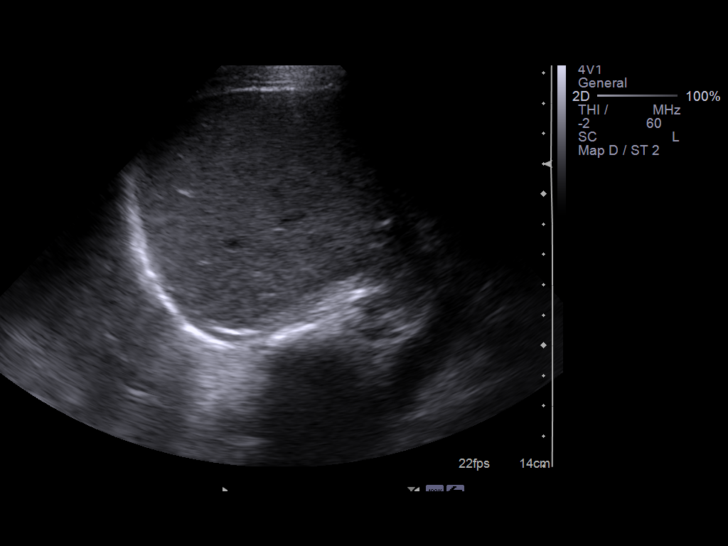
[im 17/65]
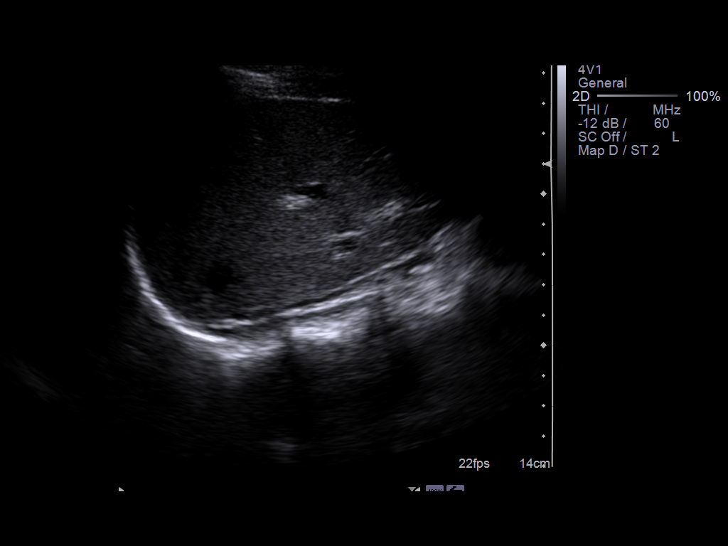
[im 22/65]
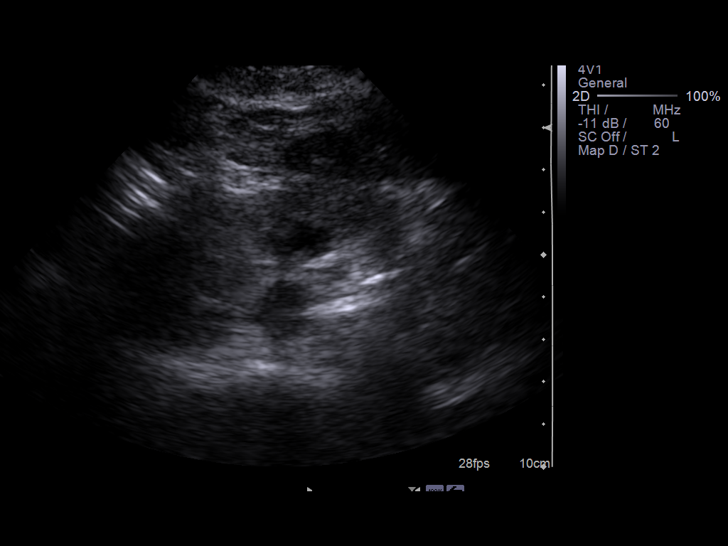
[im 27/65]
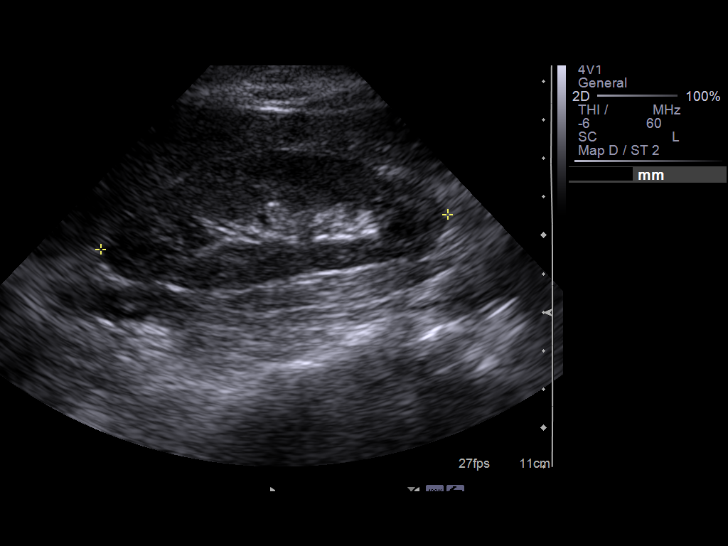
[im 33/65]
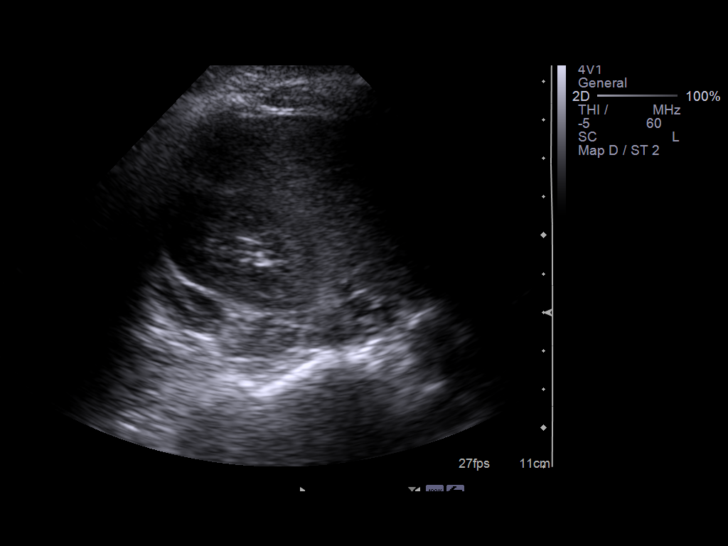
[im 38/65]
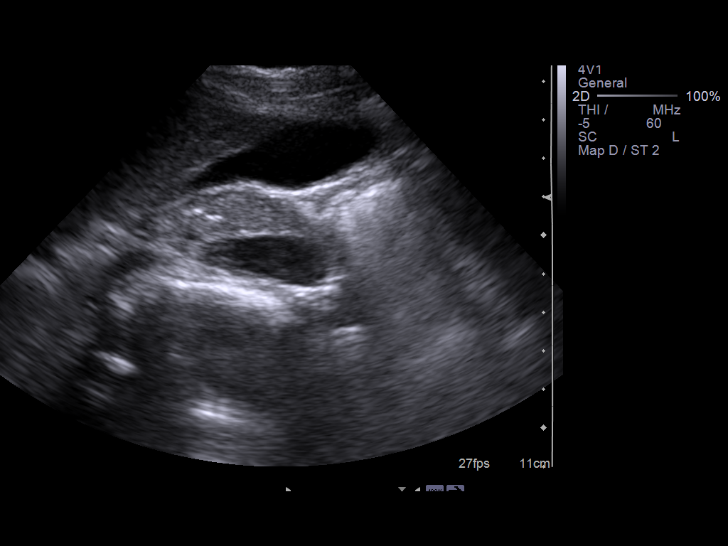
[im 43/65]
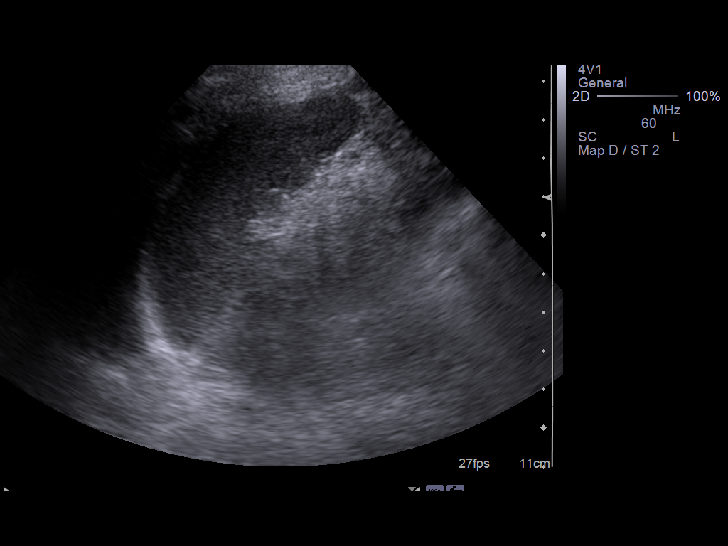
[im 49/65]
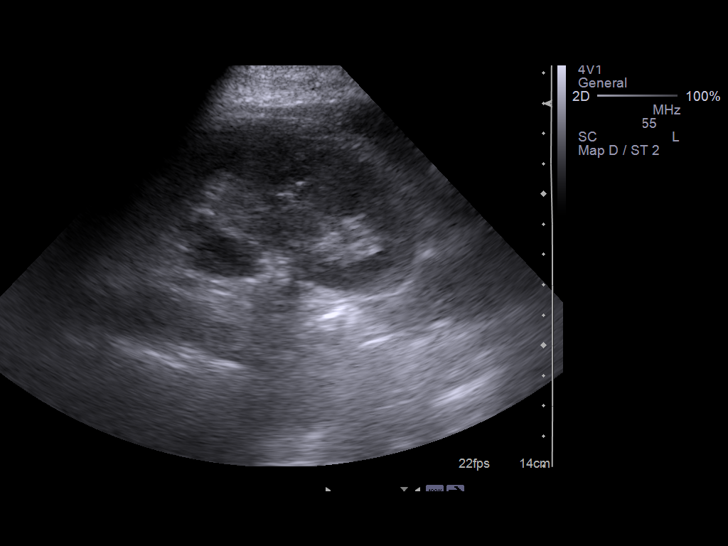
[im 54/65]
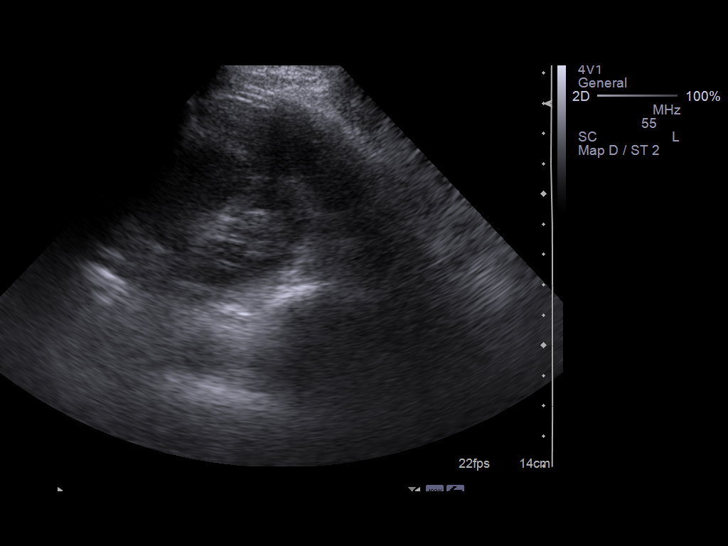
[im 59/65]
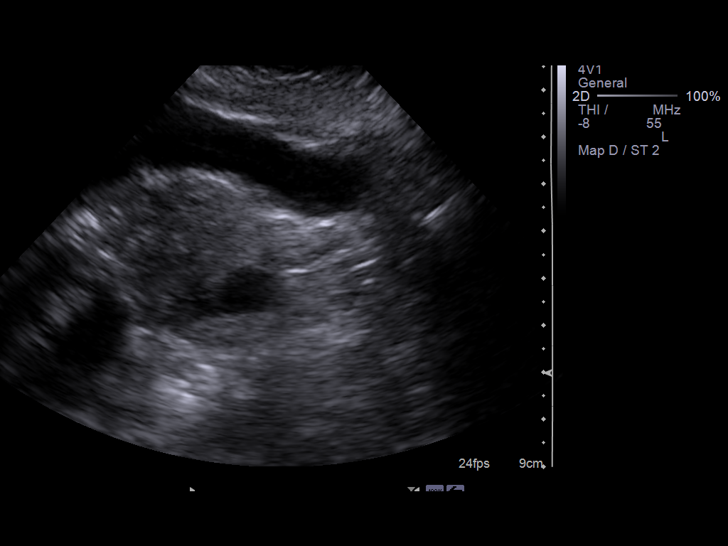
[im 65/65]
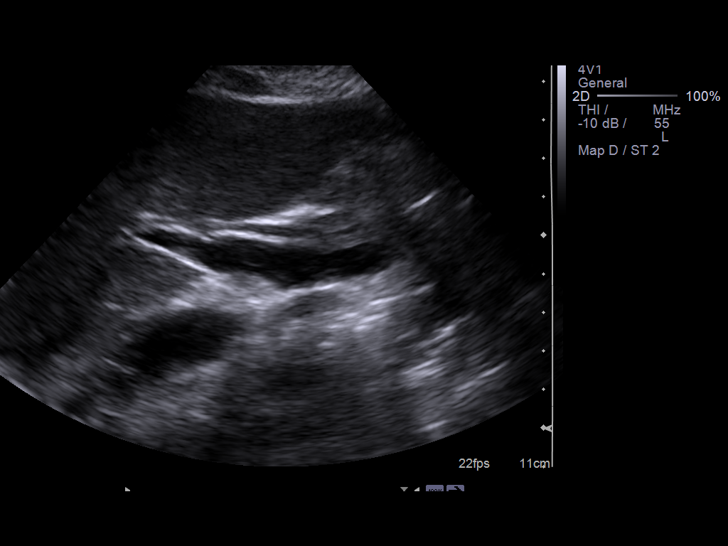

[13 of 25 positions shown; findings below may reference images not displayed]

None.  Report of CT abdomen
pelvis 03/02/2003 which describe dilation of the pancreatic duct.
FINDINGS: Gallbladder:  No shadowing gallstones or echogenic sludge.  No
gallbladder wall thickening or pericholecystic fluid.  Negative
sonographic Murphy's sign according to the ultrasound technologist.

Common bile duct:  Normal in caliber with maximum diameter
approximating 3 mm.

Liver:  Normal size and echotexture without focal parenchymal
abnormality.  Patent portal vein with hepatopetal flow.

IVC:  Patent.

Pancreas:  Although the pancreas is difficult to visualize in its
entirety, no focal pancreatic abnormality is identified. No
pancreatic ductal dilation as described on the prior CT report.

Spleen:  Normal size and echotexture without focal parenchymal
abnormality.

Right Kidney:  No hydronephrosis.  Well-preserved cortex.  Normal
size and parenchymal echotexture without focal abnormalities.
Approximately 9.1 cm length.

Left Kidney:  No hydronephrosis.  Well-preserved cortex.  Normal
size and parenchymal echotexture without focal abnormalities.
Approximately 9.2 cm length.

Abdominal aorta:  Normal in caliber throughout its visualized
course in the abdomen.
IMPRESSION: Normal abdominal ultrasound.

If there remains strong clinical concern for pancreatitis, CT of
the abdomen with contrast would be suggested in further evaluation
as this is much more sensitive in the detection of acute
pancreatitis.

## 2011-11-09 ENCOUNTER — Emergency Department (HOSPITAL_COMMUNITY): Payer: Medicaid Other

## 2011-11-09 ENCOUNTER — Encounter: Payer: Self-pay | Admitting: Emergency Medicine

## 2011-11-09 ENCOUNTER — Inpatient Hospital Stay (HOSPITAL_COMMUNITY)
Admission: EM | Admit: 2011-11-09 | Discharge: 2011-11-11 | DRG: 439 | Disposition: A | Discharge status: Home / Self Care | Payer: Medicaid Other | Attending: Internal Medicine | Admitting: Internal Medicine

## 2011-11-09 DIAGNOSIS — N200 Calculus of kidney: Secondary | ICD-10-CM

## 2011-11-09 DIAGNOSIS — R112 Nausea with vomiting, unspecified: Secondary | ICD-10-CM | POA: Diagnosis present

## 2011-11-09 DIAGNOSIS — K859 Acute pancreatitis without necrosis or infection, unspecified: Principal | ICD-10-CM | POA: Diagnosis present

## 2011-11-09 DIAGNOSIS — I10 Essential (primary) hypertension: Secondary | ICD-10-CM | POA: Diagnosis present

## 2011-11-09 DIAGNOSIS — R1012 Left upper quadrant pain: Secondary | ICD-10-CM | POA: Diagnosis present

## 2011-11-09 DIAGNOSIS — K861 Other chronic pancreatitis: Secondary | ICD-10-CM | POA: Diagnosis present

## 2011-11-09 DIAGNOSIS — D72829 Elevated white blood cell count, unspecified: Secondary | ICD-10-CM | POA: Diagnosis present

## 2011-11-09 DIAGNOSIS — N390 Urinary tract infection, site not specified: Secondary | ICD-10-CM | POA: Diagnosis present

## 2011-11-09 DIAGNOSIS — F17200 Nicotine dependence, unspecified, uncomplicated: Secondary | ICD-10-CM | POA: Diagnosis present

## 2011-11-09 HISTORY — DX: Essential (primary) hypertension: I10

## 2011-11-09 HISTORY — DX: Calculus of kidney: N20.0

## 2011-11-09 HISTORY — DX: Acute pancreatitis without necrosis or infection, unspecified: K85.90

## 2011-11-09 LAB — COMPREHENSIVE METABOLIC PANEL
AST: 26 U/L (ref 0–37)
BUN: 22 mg/dL (ref 6–23)
CO2: 28 mEq/L (ref 19–32)
Calcium: 10.2 mg/dL (ref 8.4–10.5)
Creatinine, Ser: 0.73 mg/dL (ref 0.50–1.10)
GFR calc Af Amer: >90 mL/min (ref >90)
Glucose, Bld: 105 mg/dL — ABNORMAL HIGH (ref 70–99)
Potassium: 4.9 mEq/L (ref 3.5–5.1)
Total Protein: 8.4 g/dL — ABNORMAL HIGH (ref 6.0–8.3)

## 2011-11-09 LAB — HEPATIC FUNCTION PANEL
ALT: 11 U/L (ref 0–35)
AST: 16 U/L (ref 0–37)
Albumin: 4.4 g/dL (ref 3.5–5.2)
Alkaline Phosphatase: 97 U/L (ref 39–117)
Total Bilirubin: 0.5 mg/dL (ref 0.3–1.2)
Total Protein: 7.6 g/dL (ref 6.0–8.3)

## 2011-11-09 LAB — URINALYSIS, ROUTINE W REFLEX MICROSCOPIC
Nitrite: NEGATIVE
Specific Gravity, Urine: 1.024 (ref 1.005–1.030)
Urobilinogen, UA: 1 mg/dL (ref 0.0–1.0)

## 2011-11-09 LAB — DIFFERENTIAL
Eosinophils Absolute: 0 10*3/uL (ref 0.0–0.7)
Eosinophils Relative: 0 % (ref 0–5)
Lymphs Abs: 2.2 10*3/uL (ref 0.7–4.0)
Monocytes Absolute: 0.6 10*3/uL (ref 0.1–1.0)
Monocytes Relative: 4 % (ref 3–12)
Neutrophils Relative %: 82 % — ABNORMAL HIGH (ref 43–77)

## 2011-11-09 LAB — CBC
HCT: 42.1 % (ref 36.0–46.0)
HCT: 40.1 % (ref 36.0–46.0)
Hemoglobin: 13.4 g/dL (ref 12.0–15.0)
MCH: 29.7 pg (ref 26.0–34.0)
MCHC: 33.4 g/dL (ref 30.0–36.0)
MCV: 88.9 fL (ref 78.0–100.0)
Platelets: 214 10*3/uL (ref 150–400)

## 2011-11-09 LAB — URINE MICROSCOPIC-ADD ON

## 2011-11-09 MED ORDER — FOLIC ACID 1 MG PO TABS
1.0000 mg | ORAL_TABLET | Freq: Every day | ORAL | Status: DC
  Administered 2011-11-09 – 2011-11-11 (×3): 1 mg via ORAL
  Filled 2011-11-09 (×4): qty 1

## 2011-11-09 MED ORDER — METOCLOPRAMIDE HCL 5 MG/ML IJ SOLN
10.0000 mg | Freq: Once | INTRAMUSCULAR | Status: AC
  Administered 2011-11-09: 10 mg via INTRAVENOUS
  Filled 2011-11-09: qty 2

## 2011-11-09 MED ORDER — ONDANSETRON HCL 4 MG/2ML IJ SOLN
4.0000 mg | Freq: Once | INTRAMUSCULAR | Status: DC
  Filled 2011-11-09: qty 2

## 2011-11-09 MED ORDER — ONDANSETRON HCL 4 MG/2ML IJ SOLN
4.0000 mg | Freq: Three times a day (TID) | INTRAMUSCULAR | Status: DC | PRN
  Administered 2011-11-09: 4 mg via INTRAVENOUS

## 2011-11-09 MED ORDER — PANTOPRAZOLE SODIUM 40 MG PO TBEC
40.0000 mg | DELAYED_RELEASE_TABLET | Freq: Every day | ORAL | Status: DC
  Administered 2011-11-09 – 2011-11-11 (×3): 40 mg via ORAL
  Filled 2011-11-09 (×4): qty 1

## 2011-11-09 MED ORDER — ZOLPIDEM TARTRATE 5 MG PO TABS: 5.0000 mg | ORAL_TABLET | Freq: Every evening | ORAL | Status: DC | PRN

## 2011-11-09 MED ORDER — DOCUSATE SODIUM 100 MG PO CAPS
100.0000 mg | ORAL_CAPSULE | Freq: Two times a day (BID) | ORAL | Status: DC
  Administered 2011-11-09 – 2011-11-11 (×5): 100 mg via ORAL
  Filled 2011-11-09 (×8): qty 1

## 2011-11-09 MED ORDER — ONDANSETRON HCL 4 MG/2ML IJ SOLN: 4.0000 mg | Freq: Four times a day (QID) | INTRAMUSCULAR | Status: DC | PRN

## 2011-11-09 MED ORDER — MORPHINE SULFATE 2 MG/ML IJ SOLN: 2.0000 mg | INTRAMUSCULAR | Status: DC | PRN

## 2011-11-09 MED ORDER — SODIUM CHLORIDE 0.9 % IV SOLN
Freq: Once | INTRAVENOUS | Status: AC
  Administered 2011-11-09: 08:00:00 via INTRAVENOUS

## 2011-11-09 MED ORDER — ACETAMINOPHEN 650 MG RE SUPP: 650.0000 mg | Freq: Four times a day (QID) | RECTAL | Status: DC | PRN

## 2011-11-09 MED ORDER — SODIUM CHLORIDE 0.9 % IV SOLN
INTRAVENOUS | Status: AC
  Administered 2011-11-09: 11:00:00 via INTRAVENOUS

## 2011-11-09 MED ORDER — HYDROMORPHONE HCL PF 1 MG/ML IJ SOLN
1.0000 mg | Freq: Once | INTRAMUSCULAR | Status: AC
  Administered 2011-11-09: 1 mg via INTRAVENOUS
  Filled 2011-11-09: qty 1

## 2011-11-09 MED ORDER — MORPHINE SULFATE 4 MG/ML IJ SOLN
4.0000 mg | Freq: Once | INTRAMUSCULAR | Status: AC
  Administered 2011-11-09: 4 mg via INTRAVENOUS
  Filled 2011-11-09: qty 1

## 2011-11-09 MED ORDER — KCL IN DEXTROSE-NACL 20-5-0.9 MEQ/L-%-% IV SOLN
INTRAVENOUS | Status: AC
  Administered 2011-11-09: 16:00:00 via INTRAVENOUS
  Administered 2011-11-10: 125 mL/h via INTRAVENOUS
  Administered 2011-11-10 – 2011-11-11 (×3): via INTRAVENOUS
  Filled 2011-11-09 (×9): qty 1000

## 2011-11-09 MED ORDER — LEVOFLOXACIN IN D5W 750 MG/150ML IV SOLN
750.0000 mg | INTRAVENOUS | Status: DC
  Administered 2011-11-09 – 2011-11-11 (×3): 750 mg via INTRAVENOUS
  Filled 2011-11-09 (×3): qty 150

## 2011-11-09 MED ORDER — HYDROMORPHONE HCL PF 1 MG/ML IJ SOLN: 1.0000 mg | INTRAMUSCULAR | Status: DC | PRN

## 2011-11-09 MED ORDER — AMLODIPINE BESYLATE 5 MG PO TABS
5.0000 mg | ORAL_TABLET | Freq: Every day | ORAL | Status: DC
  Administered 2011-11-09 – 2011-11-11 (×3): 5 mg via ORAL
  Filled 2011-11-09 (×4): qty 1

## 2011-11-09 MED ORDER — NICOTINE 14 MG/24HR TD PT24
14.0000 mg | MEDICATED_PATCH | Freq: Every day | TRANSDERMAL | Status: DC
  Filled 2011-11-09 (×3): qty 1

## 2011-11-09 MED ORDER — ENOXAPARIN SODIUM 40 MG/0.4ML ~~LOC~~ SOLN
40.0000 mg | SUBCUTANEOUS | Status: DC
  Administered 2011-11-09: 40 mg via SUBCUTANEOUS
  Filled 2011-11-09 (×4): qty 0.4

## 2011-11-09 MED ORDER — ONDANSETRON HCL 4 MG PO TABS: 4.0000 mg | ORAL_TABLET | Freq: Four times a day (QID) | ORAL | Status: DC | PRN

## 2011-11-09 MED ORDER — ACETAMINOPHEN 325 MG PO TABS: 650.0000 mg | ORAL_TABLET | Freq: Four times a day (QID) | ORAL | Status: DC | PRN

## 2011-11-09 MED ORDER — ONDANSETRON HCL 4 MG/2ML IJ SOLN
4.0000 mg | Freq: Once | INTRAMUSCULAR | Status: AC
  Administered 2011-11-09: 4 mg via INTRAVENOUS
  Filled 2011-11-09: qty 2

## 2011-11-09 MED ORDER — VITAMIN B-1 100 MG PO TABS
100.0000 mg | ORAL_TABLET | Freq: Every day | ORAL | Status: DC
  Administered 2011-11-10 – 2011-11-11 (×2): 100 mg via ORAL
  Filled 2011-11-09 (×3): qty 1

## 2011-11-09 NOTE — ED Provider Notes (Signed)
History     CSN: [id number]  Arrival date & time 11/09/11  0702   First MD Initiated Contact with Patient 11/09/11 [phone]      Chief Complaint  Patient presents with  . Abdominal Pain    (Consider location/radiation/quality/duration/timing/severity/associated sxs/prior treatment) HPI Comments: Patient somewhat a poor historian.  Says that she has gallstones and was to have them removed, however she did not have this done.  Patient is a 56 y.o. female presenting with abdominal pain. The history is provided by the patient.  Abdominal Pain The primary symptoms of the illness include abdominal pain, fatigue, nausea and vomiting. The primary symptoms of the illness do not include fever, shortness of breath or diarrhea. The current episode started yesterday. The problem has been rapidly worsening.  The patient has not had a change in bowel habit. Additional symptoms associated with the illness include chills.    Past Medical History  Diagnosis Date  . Hypertension     History reviewed. No pertinent past surgical history.  No family history on file.  History  Substance Use Topics  . Smoking status: Current Everyday Smoker -- 1.0 packs/day    Types: Cigarettes  . Smokeless tobacco: Not on file  . Alcohol Use:     OB History    Grav Para Term Preterm Abortions TAB SAB Ect Mult Living                  Review of Systems  Constitutional: Positive for chills and fatigue. Negative for fever.  Respiratory: Negative for shortness of breath.   Gastrointestinal: Positive for nausea, vomiting and abdominal pain. Negative for diarrhea.  All other systems reviewed and are negative.    Allergies  Review of patient's allergies indicates no known allergies.  Home Medications  No current outpatient prescriptions on file.  BP 163/103  Pulse 89  Temp(Src) 97 F (36.1 C) (Oral)  Resp 16  Wt 100 lb (45.36 kg)  SpO2 100%  Physical Exam  Nursing note and vitals  reviewed. Constitutional: She is oriented to person, place, and time.       Thin, cachectic.  HENT:  Head: Normocephalic and atraumatic.  Neck: Normal range of motion. Neck supple.  Cardiovascular: Normal rate and regular rhythm.   No murmur heard. Pulmonary/Chest: Effort normal and breath sounds normal. No respiratory distress.  Abdominal: Soft. There is no tenderness.       She is ttp in the llq,  There is no rebound or guarding.  Musculoskeletal: Normal range of motion. She exhibits no edema.  Neurological: She is alert and oriented to person, place, and time.  Skin: Skin is warm and dry.    ED Course  Procedures (including critical care time)   Labs Reviewed  CBC  DIFFERENTIAL  COMPREHENSIVE METABOLIC PANEL  LIPASE, BLOOD  URINALYSIS, ROUTINE W REFLEX MICROSCOPIC   No results found.   No diagnosis found.    MDM  Patient not improving with meds and fluids.  The lipase is elevated and ct okay.  Looks like pancreatitis as lipase is 160.  Will consult medicine for admission.        Geoffery Lyons, MD 11/09/11 1104

## 2011-11-09 NOTE — H&P (Signed)
PCP:   MULBERRY,ELIZABETH, MD   Chief Complaint:  Nausea with vomiting for 3 days.  HPI: Ms Roberta Miller presented to the ED with intractable nausea and vomiting of feeds associated with some chills, LUQ pain radiating to the back. No diarrhea or dysuria. No hematemesis. No sick close contacts or recent travel. Last drank alcohol new year's eve. Denies cough or SOB. On admission her lipase was noted to be 160. 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 The ct showed none obstructing nephrolithiasis. Her wbc was 40981 with neutrophilia. She was given analgesics and antiemetics and referred to hospitalist service for further management. She says the pain is better.  Review of Systems:  Negative except as highlighted above.  Past Medical History: Past Medical History  Diagnosis Date  . Hypertension    Past Surgical History  Procedure Date  . No past surgeries     Medications: Prior to Admission medications   Not on File    Allergies:  No Known Allergies  Social History:  reports that she has been smoking Cigarettes.  She has been smoking about 1 pack per day. She has never used smokeless tobacco. She reports that she drinks about .5 ounces of alcohol per week. She reports that she does not use illicit drugs.   Family History: History reviewed. No pertinent family history.  Physical Exam: Filed Vitals:   11/09/11 0704 11/09/11 1117 11/09/11 1138  BP: 163/103 149/99 151/92  Pulse: 89 75   Temp: 97 F (36.1 C) 97.9 F (36.6 C) 97.9 F (36.6 C)  TempSrc: Oral Oral Oral  Resp: 16  18  Weight: 45.36 kg (100 lb)    SpO2: 100% 97% 98%   Skinny, not in distress. HEENT- dry mucus membranes. No jvd. No carotid bruits. RS- lungs clear. CVS- S1S2. RRR. No murmurs. Abdomen- scaphoid, some tenderness right costophrenic angle. No rebound or guarding. +BS. CNS- grossly intact. Extremities- no pedal edema.   Labs on  Admission:   [facility] 11/09/11 0800  NA 136  K 4.9  CL 96  CO2 28  GLUCOSE 105*  BUN 22  CREATININE 0.73  CALCIUM 10.2  MG --  PHOS --    Basename 11/09/11 0800  AST 26  ALT 13  ALKPHOS 96  BILITOT 0.5  PROT 8.4*  ALBUMIN 4.5    Basename 11/09/11 0800  LIPASE 158*  AMYLASE --    Basename 11/09/11 0800  WBC 15.6*  NEUTROABS 12.9*  HGB 14.3  HCT 42.1  MCV 88.3  PLT 214   No results found for this basename: CKTOTAL:3,CKMB:3,CKMBINDEX:3,TROPONINI:3 in the last 72 hours No results found for this basename: TSH,T4TOTAL,FREET3,T3FREE,THYROIDAB in the last 72 hours No results found for this basename: VITAMINB12:2,FOLATE:2,FERRITIN:2,TIBC:2,IRON:2,RETICCTPCT:2 in the last 72 hours  Radiological Exams on Admission: Ct Abdomen Pelvis Wo Contrast  11/09/2011  *RADIOLOGY REPORT*  Clinical Data: Mid abdominal pain.  Nausea.  CT ABDOMEN AND PELVIS WITHOUT CONTRAST 11/09/2011:  Technique:  Multidetector CT imaging of the abdomen and pelvis was performed following the standard protocol without intravenous contrast.  Comparison: MRI abdomen (kidneys) 03/24/2010 and urinary tract ultrasound 02/24/2010 [facility].  Findings: Tiny (1-2 mm) non-obstructing calculi in the upper pole calyces of the left kidney.  No urinary tract calculi elsewhere on either side.  No evidence of hydronephrosis on either side.  Within the limits of the low dose unenhanced technique, no focal parenchymal abnormality involving either kidney.  Normal low dose unenhanced appearance of  the liver, spleen, pancreas, and adrenal glands.  Gallbladder unremarkable by CT.  No biliary ductal dilation.  Mild bilateral iliac atherosclerosis.  No significant lymphadenopathy.  Uterus unremarkable by CT.  Bilobed right ovarian cyst versus two adjacent cysts, measuring in total approximately 3.8 x 4.7 x 4.7 cm.  Small left ovarian cysts.  No free pelvic fluid to confirm cyst rupture.  Numerous pelvic phleboliths.  Urinary  bladder decompressed and unremarkable.  Visualized lung bases clear.  Bone window images unremarkable.  IMPRESSION:  1.  Tiny (1-2 mm) non-obstructing calculi in the upper pole calyces of the left kidney.  No urinary tract calculi elsewhere on either side. 2.  Bilobed right ovarian cyst versus two adjacent cysts, approximating 5 cm in total.  No free pelvic fluid to confirm cyst rupture.  This is almost certainly benign, but follow up ultrasound is recommended in 1 year according to the Society of Radiologists in Ultrasound 2010 Consensus Conference Statement (D Lenis Noon et al. Management of Asymptomatic Ovarian and Other Adnexal Cysts Imaged at [place]:  Society of Radiologists in Ultrasound Consensus Conference Statement 2010.   Radiology 256 (Sept 2010):  [phone].).  Original Report Authenticated By: Arnell Sieving, M.D.    Assessment Intractable nausea and vomiting in lady with prior history of pancreatitis, apparently alcoholic. She has elevated lipase although CT abdomen/pelvis does not suggest acute pancreatitis. She has some nephrolithiasis with suggestion of uti. Either of these etiologies could be responsible for current presentation. Plan .Nausea & vomiting- better. Rehydrate, antiemetic/ppi. .Leukocytosis- ? Infectious etiology. Monitor on quinolone. .Pancreatitis- apparently acute on chronic. Check amylase. Supportive care. Folate/thiamine. Check magnesium. .Nephrolithiasis- small. Likely to pass with fluids. Rehydrate. Marland KitchenUTI (lower urinary tract infection)- on levaquin. Check UC. Marland KitchenHTN (hypertension)- not on meds. Start norvasc. Condition guarded. I anticipate her length of stay to be 1-2 based on clinical progress.    Roberta Miller [phone]. 11/09/2011, 12:20 PM

## 2011-11-09 NOTE — ED Notes (Signed)
CBC and Creatinine duplicate orders-collected with first blood draw. m

## 2011-11-09 NOTE — ED Notes (Signed)
Pt alert, nad, c/o n/v, abd /flank pain, onset a few days ago, resp even unlabored, skin pwd. Ambulates to room, pt believes pain r/t kidney stone

## 2011-11-09 NOTE — ED Notes (Signed)
No blood cultures ordered per admitting physician, verified,

## 2011-11-09 NOTE — ED Notes (Signed)
ZOX:[id number]<AV> Expected date:11/09/11<BR> Expected time: 6:43 AM<BR> Means of arrival:Ambulance<BR> Comments:<BR> Flu like sx, back pain, hx of kidney stones

## 2011-11-10 LAB — CBC
HCT: 34.2 % — ABNORMAL LOW (ref 36.0–46.0)
Hemoglobin: 11.4 g/dL — ABNORMAL LOW (ref 12.0–15.0)
MCHC: 33.3 g/dL (ref 30.0–36.0)
RDW: 15 % (ref 11.5–15.5)
WBC: 9.7 10*3/uL (ref 4.0–10.5)

## 2011-11-10 LAB — URINE CULTURE
Culture  Setup Time: 201301030110
Culture: NO GROWTH

## 2011-11-10 LAB — COMPREHENSIVE METABOLIC PANEL
Alkaline Phosphatase: 74 U/L (ref 39–117)
BUN: 10 mg/dL (ref 6–23)
CO2: 25 mEq/L (ref 19–32)
Calcium: 9.3 mg/dL (ref 8.4–10.5)
GFR calc Af Amer: >90 mL/min (ref >90)
GFR calc non Af Amer: 78 mL/min — ABNORMAL LOW (ref >90)
Glucose, Bld: 92 mg/dL (ref 70–99)
Total Protein: 5.7 g/dL — ABNORMAL LOW (ref 6.0–8.3)

## 2011-11-10 LAB — APTT: aPTT: 30 seconds (ref 24–37)

## 2011-11-10 LAB — PROTIME-INR
INR: 1.17 (ref 0.00–1.49)
Prothrombin Time: 15.1 seconds (ref 11.6–15.2)

## 2011-11-10 NOTE — Progress Notes (Signed)
Feels better. Hungry.no pain.   1. Acute pancreatitis     Past Medical History  Diagnosis Date  . Hypertension    Current Facility-Administered Medications  Medication Dose Route Frequency Provider Last Rate Last Dose  . 0.9 %  sodium chloride infusion   Intravenous STAT Geoffery Lyons, MD 125 mL/hr at 11/09/11 1111    . acetaminophen (TYLENOL) tablet 650 mg  650 mg Oral Q6H PRN Kimba Lottes       Or  . acetaminophen (TYLENOL) suppository 650 mg  650 mg Rectal Q6H PRN Keyairra Kolinski      . amLODipine (NORVASC) tablet 5 mg  5 mg Oral Daily Sheryle Vice   5 mg at 11/10/11 0931  . dextrose 5 % and 0.9 % NaCl with KCl 20 mEq/L infusion   Intravenous Continuous Gawain Crombie 125 mL/hr at 11/10/11 1134 125 mL/hr at 11/10/11 1134  . docusate sodium (COLACE) capsule 100 mg  100 mg Oral BID Akeria Hedstrom   100 mg at 11/10/11 0931  . enoxaparin (LOVENOX) injection 40 mg  40 mg Subcutaneous Q24H Osvaldo Lamping   40 mg at 11/09/11 1359  . folic acid (FOLVITE) tablet 1 mg  1 mg Oral Daily Aalyssa Elderkin   1 mg at 11/10/11 0931  . Levofloxacin (LEVAQUIN) IVPB 750 mg  750 mg Intravenous Q24H Yennifer Segovia   750 mg at 11/10/11 1203  . morphine 2 MG/ML injection 2 mg  2 mg Intravenous Q3H PRN Myleigh Amara      . nicotine (NICODERM CQ - dosed in mg/24 hours) patch 14 mg  14 mg Transdermal Daily Laurieann Friddle      . ondansetron (ZOFRAN) tablet 4 mg  4 mg Oral Q6H PRN Airyonna Franklyn       Or  . ondansetron (ZOFRAN) injection 4 mg  4 mg Intravenous Q6H PRN Ziquan Fidel      . pantoprazole (PROTONIX) EC tablet 40 mg  40 mg Oral Q1200 Ijeoma Loor   40 mg at 11/10/11 1203  . thiamine (VITAMIN B-1) tablet 100 mg  100 mg Oral Daily Dawsen Krieger   100 mg at 11/10/11 0931  . zolpidem (AMBIEN) tablet 5 mg  5 mg Oral QHS PRN Roger Fasnacht       No Known Allergies Active Problems:  Nausea & vomiting  Leukocytosis  Pancreatitis  Nephrolithiasis  UTI (lower urinary tract infection)  HTN  (hypertension)   Vital signs in last 24 hours: Temp:  [98.2 F (36.8 C)-98.6 F (37 C)] 98.6 F (37 C) (01/03 1420) Pulse Rate:  [60-70] 70  (01/03 1420) Resp:  [18] 18  (01/03 1420) BP: (120-125)/(82-89) 123/85 mmHg (01/03 1420) SpO2:  [98 %-99 %] 98 % (01/03 1420) Weight change:  Last BM Date: 11/09/11  Intake/Output from previous day: 01/02 0701 - 01/03 0700 In: 2500 [I.V.:2500] Out: -  Intake/Output this shift: Total I/O In: 2875 [P.O.:1500; I.V.:1375] Out: -   Lab Results:  Basename 11/10/11 0415 11/09/11 1235  WBC 9.7 15.5*  HGB 11.4* 13.4  HCT 34.2* 40.1  PLT 159 213   BMET  Basename 11/10/11 0415 11/09/11 1235 11/09/11 0800  NA 140 -- 136  K 4.2 -- 4.9  CL 110 -- 96  CO2 25 -- 28  GLUCOSE 92 -- 105*  BUN 10 -- 22  CREATININE 0.84 0.73 --  CALCIUM 9.3 -- 10.2    Studies/Results: Ct Abdomen Pelvis Wo Contrast  11/09/2011  *RADIOLOGY REPORT*  Clinical Data: Mid abdominal pain.  Nausea.  CT ABDOMEN AND PELVIS WITHOUT CONTRAST 11/09/2011:  Technique:  Multidetector CT imaging of the abdomen and pelvis was performed following the standard protocol without intravenous contrast.  Comparison: MRI abdomen (kidneys) 03/24/2010 and urinary tract ultrasound 02/24/2010 [facility].  Findings: Tiny (1-2 mm) non-obstructing calculi in the upper pole calyces of the left kidney.  No urinary tract calculi elsewhere on either side.  No evidence of hydronephrosis on either side.  Within the limits of the low dose unenhanced technique, no focal parenchymal abnormality involving either kidney.  Normal low dose unenhanced appearance of the liver, spleen, pancreas, and adrenal glands.  Gallbladder unremarkable by CT.  No biliary ductal dilation.  Mild bilateral iliac atherosclerosis.  No significant lymphadenopathy.  Uterus unremarkable by CT.  Bilobed right ovarian cyst versus two adjacent cysts, measuring in total approximately 3.8 x 4.7 x 4.7 cm.  Small left ovarian cysts.  No  free pelvic fluid to confirm cyst rupture.  Numerous pelvic phleboliths.  Urinary bladder decompressed and unremarkable.  Visualized lung bases clear.  Bone window images unremarkable.  IMPRESSION:  1.  Tiny (1-2 mm) non-obstructing calculi in the upper pole calyces of the left kidney.  No urinary tract calculi elsewhere on either side. 2.  Bilobed right ovarian cyst versus two adjacent cysts, approximating 5 cm in total.  No free pelvic fluid to confirm cyst rupture.  This is almost certainly benign, but follow up ultrasound is recommended in 1 year according to the Society of Radiologists in Ultrasound 2010 Consensus Conference Statement (D Lenis Noon et al. Management of Asymptomatic Ovarian and Other Adnexal Cysts Imaged at [place]:  Society of Radiologists in Ultrasound Consensus Conference Statement 2010.   Radiology 256 (Sept 2010):  [phone].).  Original Report Authenticated By: Arnell Sieving, M.D.   Dg Chest 2 View  11/09/2011  *RADIOLOGY REPORT*  Clinical Data: Weakness and shortness of breath.  CHEST - 2 VIEW  Comparison: 08/02/09  Findings: Mild cardiomegaly and tortuosity of the thoracic aorta is stable.  Both lungs are clear.  No evidence of pleural effusion. No mass or lymphadenopathy identified.  IMPRESSION: Stable mild cardiomegaly.  No active lung disease.  Original Report Authenticated By: Danae Orleans, M.D.    Medications: I have reviewed the patient's current medications.   Physical exam GENERAL- alert HEAD- normal atraumatic, no neck masses, normal thyroid, no jvd RESPIRATORY- appears well, vitals normal, no respiratory distress, acyanotic, normal RR, ear and throat exam is normal, neck free of mass or lymphadenopathy, chest clear, no wheezing, crepitations, rhonchi, normal symmetric air entry CVS- regular rate and rhythm, S1, S2 normal, no murmur, click, rub or gallop ABDOMEN- abdomen is soft without significant tenderness, masses, organomegaly or guarding NEURO- Grossly  normal EXTREMITIES- extremities normal, atraumatic, no cyanosis or edema  Plan .Pancreatitis apparently acute on chronic. hungry will feed. If doesn't tolerate, consider mrcp. Cause seems to be alcoholic. .Nephrolithiasis- small. Likely to pass with fluids. Rehydrate. Marland KitchenUTI (lower urinary tract infection)- on levaquin. follow UC. Marland KitchenHTN (hypertension)- better on norvasc.  Condition guarded.     Tayja Manzer 11/10/2011 6:59 PM Pager: [phone].

## 2011-11-11 ENCOUNTER — Encounter (HOSPITAL_COMMUNITY): Payer: Self-pay | Admitting: Internal Medicine

## 2011-11-11 LAB — CBC
Hemoglobin: 11.5 g/dL — ABNORMAL LOW (ref 12.0–15.0)
MCH: 29.9 pg (ref 26.0–34.0)
MCHC: 33 g/dL (ref 30.0–36.0)
Platelets: 155 10*3/uL (ref 150–400)

## 2011-11-11 LAB — COMPREHENSIVE METABOLIC PANEL
ALT: 8 U/L (ref 0–35)
AST: 13 U/L (ref 0–37)
Albumin: 2.8 g/dL — ABNORMAL LOW (ref 3.5–5.2)
Alkaline Phosphatase: 73 U/L (ref 39–117)
Calcium: 9.2 mg/dL (ref 8.4–10.5)
Glucose, Bld: 91 mg/dL (ref 70–99)
Potassium: 4.3 mEq/L (ref 3.5–5.1)
Sodium: 141 mEq/L (ref 135–145)
Total Protein: 5.6 g/dL — ABNORMAL LOW (ref 6.0–8.3)

## 2011-11-11 MED ORDER — AMLODIPINE BESYLATE 5 MG PO TABS: 5.0000 mg | ORAL_TABLET | Freq: Every day | ORAL | Status: DC

## 2011-11-11 MED ORDER — FOLIC ACID 1 MG PO TABS: 1.0000 mg | ORAL_TABLET | Freq: Every day | ORAL | Status: AC

## 2011-11-11 MED ORDER — THIAMINE HCL 100 MG PO TABS: 100.0000 mg | ORAL_TABLET | Freq: Every day | ORAL | Status: AC

## 2011-11-11 MED ORDER — PANTOPRAZOLE SODIUM 40 MG PO TBEC: 40.0000 mg | DELAYED_RELEASE_TABLET | Freq: Every day | ORAL | Status: DC

## 2011-11-11 MED ORDER — LEVOFLOXACIN 750 MG PO TABS
750.0000 mg | ORAL_TABLET | ORAL | Status: DC
  Filled 2011-11-11 (×2): qty 1

## 2011-11-11 MED ORDER — LEVOFLOXACIN 750 MG PO TABS: 750.0000 mg | ORAL_TABLET | ORAL | Status: AC

## 2011-11-11 NOTE — Discharge Summary (Signed)
Physician Discharge Summary  Patient ID: Roberta Miller MRN: [id number] DOB/AGE: 04/19/1956 56 y.o.  Admit date: 11/09/2011 Discharge date: 11/11/2011  Primary Care Physician:  Julieanne Manson, MD   Discharge Diagnoses:    Present on Admission:  .Nausea & vomiting .Leukocytosis .Pancreatitis .Nephrolithiasis .UTI (lower urinary tract infection) .HTN (hypertension)  Current Discharge Medication List    START taking these medications   Details  amLODipine (NORVASC) 5 MG tablet Take 1 tablet (5 mg total) by mouth daily. Qty: 30 tablet, Refills: 0    folic acid (FOLVITE) 1 MG tablet Take 1 tablet (1 mg total) by mouth daily. Qty: 30 tablet, Refills: 0    levofloxacin (LEVAQUIN) 750 MG tablet Take 1 tablet (750 mg total) by mouth daily. Qty: 4 tablet, Refills: 0    pantoprazole (PROTONIX) 40 MG tablet Take 1 tablet (40 mg total) by mouth daily at 12 noon. Qty: 30 tablet, Refills: 0    thiamine 100 MG tablet Take 1 tablet (100 mg total) by mouth daily. Qty: 30 tablet, Refills: 0      CONTINUE these medications which have NOT CHANGED   Details  hydrochlorothiazide (HYDRODIURIL) 25 MG tablet Take 25 mg by mouth daily.        STOP taking these medications     metoprolol tartrate (LOPRESSOR) 25 MG tablet          Disposition and Follow-up: Pt medically stable and ready for discharge to home. Will follow up with PCP 10 days  Consults:  none   Physical exam: GENERAL- alert  HEAD- normal atraumatic, no neck masses, normal thyroid, no jvd  RESPIRATORY- appears well, vitals normal, no respiratory distress, acyanotic, normal RR, ear and throat exam is normal, neck free of mass or lymphadenopathy, chest clear, no wheezing, crepitations, rhonchi, normal symmetric air entry  CVS- regular rate and rhythm, S1, S2 normal, no murmur, click, rub or gallop  ABDOMEN- abdomen is soft without significant tenderness, masses, organomegaly or guarding  NEURO- Grossly normal    EXTREMITIES- extremities normal, atraumatic, no cyanosis or edema   Significant Diagnostic Studies:  Ct Abdomen Pelvis Wo Contrast  11/09/2011  *RADIOLOGY REPORT*  Clinical Data: Mid abdominal pain.  Nausea.  CT ABDOMEN AND PELVIS WITHOUT CONTRAST 11/09/2011:  Technique:  Multidetector CT imaging of the abdomen and pelvis was performed following the standard protocol without intravenous contrast.  Comparison: MRI abdomen (kidneys) 03/24/2010 and urinary tract ultrasound 02/24/2010 [facility].  Findings: Tiny (1-2 mm) non-obstructing calculi in the upper pole calyces of the left kidney.  No urinary tract calculi elsewhere on either side.  No evidence of hydronephrosis on either side.  Within the limits of the low dose unenhanced technique, no focal parenchymal abnormality involving either kidney.  Normal low dose unenhanced appearance of the liver, spleen, pancreas, and adrenal glands.  Gallbladder unremarkable by CT.  No biliary ductal dilation.  Mild bilateral iliac atherosclerosis.  No significant lymphadenopathy.  Uterus unremarkable by CT.  Bilobed right ovarian cyst versus two adjacent cysts, measuring in total approximately 3.8 x 4.7 x 4.7 cm.  Small left ovarian cysts.  No free pelvic fluid to confirm cyst rupture.  Numerous pelvic phleboliths.  Urinary bladder decompressed and unremarkable.  Visualized lung bases clear.  Bone window images unremarkable.  IMPRESSION:  1.  Tiny (1-2 mm) non-obstructing calculi in the upper pole calyces of the left kidney.  No urinary tract calculi elsewhere on either side. 2.  Bilobed right ovarian cyst versus two adjacent cysts, approximating 5 cm  in total.  No free pelvic fluid to confirm cyst rupture.  This is almost certainly benign, but follow up ultrasound is recommended in 1 year according to the Society of Radiologists in Ultrasound 2010 Consensus Conference Statement (D Lenis Noon et al. Management of Asymptomatic Ovarian and Other Adnexal Cysts Imaged at  [place]:  Society of Radiologists in Ultrasound Consensus Conference Statement 2010.   Radiology 256 (Sept 2010):  943-954.).  Original Report Authenticated By: Arnell Sieving, M.D.   Dg Chest 2 View  11/09/2011  *RADIOLOGY REPORT*  Clinical Data: Weakness and shortness of breath.  CHEST - 2 VIEW  Comparison: 08/02/09  Findings: Mild cardiomegaly and tortuosity of the thoracic aorta is stable.  Both lungs are clear.  No evidence of pleural effusion. No mass or lymphadenopathy identified.  IMPRESSION: Stable mild cardiomegaly.  No active lung disease.  Original Report Authenticated By: Danae Orleans, M.D.    Labs Reviewed  CBC - Abnormal; Notable for the following:    WBC 15.6 (*)    All other components within normal limits  DIFFERENTIAL - Abnormal; Notable for the following:    Neutrophils Relative 82 (*)    Neutro Abs 12.9 (*)    All other components within normal limits  COMPREHENSIVE METABOLIC PANEL - Abnormal; Notable for the following:    Glucose, Bld 105 (*)    Total Protein 8.4 (*)    All other components within normal limits  LIPASE, BLOOD - Abnormal; Notable for the following:    Lipase 158 (*)    All other components within normal limits  URINALYSIS, ROUTINE W REFLEX MICROSCOPIC - Abnormal; Notable for the following:    APPearance TURBID (*)    Hgb urine dipstick MODERATE (*)    Ketones, ur 15 (*)    Protein, ur 100 (*)    All other components within normal limits  URINE MICROSCOPIC-ADD ON - Abnormal; Notable for the following:    Squamous Epithelial / LPF FEW (*)    Bacteria, UA FEW (*)    All other components within normal limits  CBC - Abnormal; Notable for the following:    WBC 15.5 (*)    All other components within normal limits  AMYLASE - Abnormal; Notable for the following:    Amylase 332 (*)    All other components within normal limits  COMPREHENSIVE METABOLIC PANEL - Abnormal; Notable for the following:    Total Protein 5.7 (*)    Albumin 3.0 (*)    GFR calc  non Af Amer 78 (*)    All other components within normal limits  CBC - Abnormal; Notable for the following:    RBC 3.80 (*)    Hemoglobin 11.4 (*)    HCT 34.2 (*)    All other components within normal limits  CBC - Abnormal; Notable for the following:    RBC 3.84 (*)    Hemoglobin 11.5 (*)    HCT 34.8 (*)    All other components within normal limits  COMPREHENSIVE METABOLIC PANEL - Abnormal; Notable for the following:    Total Protein 5.6 (*)    Albumin 2.8 (*)    GFR calc non Af Amer 74 (*)    GFR calc Af Amer 85 (*)    All other components within normal limits  CREATININE, SERUM  HEPATIC FUNCTION PANEL  MAGNESIUM  PHOSPHORUS  TSH  URINE CULTURE  HIV ANTIBODY (ROUTINE TESTING)  PROTIME-INR  APTT  AMYLASE  LIPASE, BLOOD  Ct Abdomen Pelvis Wo Contrast  11/09/2011  *RADIOLOGY REPORT*  Clinical Data: Mid abdominal pain.  Nausea.  CT ABDOMEN AND PELVIS WITHOUT CONTRAST 11/09/2011:  Technique:  Multidetector CT imaging of the abdomen and pelvis was performed following the standard protocol without intravenous contrast.  Comparison: MRI abdomen (kidneys) 03/24/2010 and urinary tract ultrasound 02/24/2010 [facility].  Findings: Tiny (1-2 mm) non-obstructing calculi in the upper pole calyces of the left kidney.  No urinary tract calculi elsewhere on either side.  No evidence of hydronephrosis on either side.  Within the limits of the low dose unenhanced technique, no focal parenchymal abnormality involving either kidney.  Normal low dose unenhanced appearance of the liver, spleen, pancreas, and adrenal glands.  Gallbladder unremarkable by CT.  No biliary ductal dilation.  Mild bilateral iliac atherosclerosis.  No significant lymphadenopathy.  Uterus unremarkable by CT.  Bilobed right ovarian cyst versus two adjacent cysts, measuring in total approximately 3.8 x 4.7 x 4.7 cm.  Small left ovarian cysts.  No free pelvic fluid to confirm cyst rupture.  Numerous pelvic phleboliths.   Urinary bladder decompressed and unremarkable.  Visualized lung bases clear.  Bone window images unremarkable.  IMPRESSION:  1.  Tiny (1-2 mm) non-obstructing calculi in the upper pole calyces of the left kidney.  No urinary tract calculi elsewhere on either side. 2.  Bilobed right ovarian cyst versus two adjacent cysts, approximating 5 cm in total.  No free pelvic fluid to confirm cyst rupture.  This is almost certainly benign, but follow up ultrasound is recommended in 1 year according to the Society of Radiologists in Ultrasound 2010 Consensus Conference Statement (D Lenis Noon et al. Management of Asymptomatic Ovarian and Other Adnexal Cysts Imaged at [place]:  Society of Radiologists in Ultrasound Consensus Conference Statement 2010.   Radiology 256 (Sept 2010):  [phone].).  Original Report Authenticated By: Arnell Sieving, M.D.   Dg Chest 2 View  11/09/2011  *RADIOLOGY REPORT*  Clinical Data: Weakness and shortness of breath.  CHEST - 2 VIEW  Comparison: 08/02/09  Findings: Mild cardiomegaly and tortuosity of the thoracic aorta is stable.  Both lungs are clear.  No evidence of pleural effusion. No mass or lymphadenopathy identified.  IMPRESSION: Stable mild cardiomegaly.  No active lung disease.  Original Report Authenticated By: Danae Orleans, M.D.       Brief H and P: For complete details please refer to admission H and P, but in brief   Ms Yarborough presented to the ED with intractable nausea and vomiting of feeds associated with some chills, LUQ pain radiating to the back. No diarrhea or dysuria. No hematemesis. No sick close contacts or recent travel. Last drank alcohol new year's eve. Denies cough or SOB. On admission her lipase was noted to be 160. 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 The ct showed none obstructing nephrolithiasis. Her wbc was 16109 with neutrophilia. She was given analgesics and antiemetics and referred to  hospitalist service for further management. She says the pain is better.    Hospital Course:  No resolved problems to display.  Active Hospital Problems  Diagnoses Date Noted   . Nausea & vomiting 11/09/2011   . Leukocytosis 11/09/2011   . Pancreatitis 11/09/2011   . Nephrolithiasis 11/09/2011   . UTI (lower urinary tract infection) 11/09/2011   . HTN (hypertension) 11/09/2011     Resolved Hospital Problems  Diagnoses Date Noted Date Resolved    Active Problems:  .  Pancreatitis apparently acute on chronic. Pt admitted to medical floor. Provided with antiemetics, pain med and antibiotic. Supported with IV fluids. Diagnostics as above. Diet advanced on day 2 and at time of discharge pt tolerating regular diet. No pain/no nausea. Cause seems to be alcoholic.  .Nephrolithiasis- small. Likely to pass with fluids. Pt hydrated with IV fluids during hospitalization. No pain. Monitor on OP basis.  Marland KitchenUTI (lower urinary tract infection)- Urine culture with no growth to date. Levaquin IV for 3 days. Will discharge with 4 more days levaquin. Marland KitchenHTN (hypertension)- better on norvasc. Pt on BB and hctz pre admission. Only on amlodipine during hospitalization.  BP with fair control will continue hctz and and amlodipine and discontine BB for now. Will need OP follow up. Concern over compliance    Time spent on Discharge: 40 minutes  Signed: Gwenyth Bender 11/11/2011, 4:02 PM I have sen and examined Ms Holz at bedside and discussed discharge plan with Clydie Braun as above. She should follow with [facility].

## 2012-03-02 ENCOUNTER — Emergency Department (HOSPITAL_COMMUNITY): Payer: Medicaid Other

## 2012-03-02 ENCOUNTER — Emergency Department (HOSPITAL_COMMUNITY)
Admission: EM | Admit: 2012-03-02 | Discharge: 2012-03-03 | Disposition: A | Discharge status: Home / Self Care | Payer: Medicaid Other | Attending: Emergency Medicine | Admitting: Emergency Medicine

## 2012-03-02 ENCOUNTER — Encounter (HOSPITAL_COMMUNITY): Payer: Self-pay | Admitting: *Deleted

## 2012-03-02 DIAGNOSIS — S83519A Sprain of anterior cruciate ligament of unspecified knee, initial encounter: Secondary | ICD-10-CM

## 2012-03-02 DIAGNOSIS — S82143A Displaced bicondylar fracture of unspecified tibia, initial encounter for closed fracture: Secondary | ICD-10-CM

## 2012-03-02 DIAGNOSIS — I10 Essential (primary) hypertension: Secondary | ICD-10-CM | POA: Insufficient documentation

## 2012-03-02 DIAGNOSIS — F17200 Nicotine dependence, unspecified, uncomplicated: Secondary | ICD-10-CM | POA: Insufficient documentation

## 2012-03-02 DIAGNOSIS — S83509A Sprain of unspecified cruciate ligament of unspecified knee, initial encounter: Secondary | ICD-10-CM | POA: Insufficient documentation

## 2012-03-02 DIAGNOSIS — M25569 Pain in unspecified knee: Secondary | ICD-10-CM | POA: Insufficient documentation

## 2012-03-02 DIAGNOSIS — W010XXA Fall on same level from slipping, tripping and stumbling without subsequent striking against object, initial encounter: Secondary | ICD-10-CM | POA: Insufficient documentation

## 2012-03-02 DIAGNOSIS — S82109A Unspecified fracture of upper end of unspecified tibia, initial encounter for closed fracture: Secondary | ICD-10-CM | POA: Insufficient documentation

## 2012-03-02 DIAGNOSIS — M7989 Other specified soft tissue disorders: Secondary | ICD-10-CM | POA: Insufficient documentation

## 2012-03-02 MED ORDER — OXYCODONE-ACETAMINOPHEN 5-325 MG PO TABS: 1.0000 | ORAL_TABLET | ORAL | Status: AC | PRN

## 2012-03-02 MED ORDER — OXYCODONE-ACETAMINOPHEN 5-325 MG PO TABS
1.0000 | ORAL_TABLET | Freq: Once | ORAL | Status: AC
  Administered 2012-03-02: 1 via ORAL
  Filled 2012-03-02: qty 1

## 2012-03-02 MED ORDER — IBUPROFEN 800 MG PO TABS
800.0000 mg | ORAL_TABLET | Freq: Once | ORAL | Status: AC
  Administered 2012-03-02: 800 mg via ORAL
  Filled 2012-03-02: qty 1

## 2012-03-02 NOTE — ED Notes (Signed)
Pt here via EMS.  States injured knee while chasing children tonight. Screaming in pain, would not let EMS touch or manipulate knee.  BP 120/80, HR 82

## 2012-03-02 NOTE — ED Provider Notes (Signed)
History     CSN: [id number]  Arrival date & time 03/02/12  2049   First MD Initiated Contact with Patient 03/02/12 2328      Chief Complaint  Patient presents with  . Knee Pain    HPI  History provided by the patient. Patient is a 56 year old female with history of hypertension who presents with complaints of left knee pain after fall earlier this evening. Patient reports that she was chasing around her grandson in the yard and stumbled and fell onto her left knee. Since that time patient has had increasing pain and swelling to the knee. Patient reports difficulty ambulating and has been able to move on knee. Patient did not take anything for the symptoms. SHe denies any associated numbness or tingling to the foot. Symptoms are described as severe. She denies any other aggravating or alleviating factors.    Past Medical History  Diagnosis Date  . Hypertension   . UTI (lower urinary tract infection)   . Pancreatitis     Past Surgical History  Procedure Date  . No past surgeries     No family history on file.  History  Substance Use Topics  . Smoking status: Current Everyday Smoker -- 1.0 packs/day    Types: Cigarettes  . Smokeless tobacco: Never Used  . Alcohol Use: 0.5 oz/week    1 drink(s) per week     DRINKS SOCIALLY    OB History    Grav Para Term Preterm Abortions TAB SAB Ect Mult Living                  Review of Systems  HENT: Negative for neck pain.   Cardiovascular: Negative for chest pain.  Gastrointestinal: Negative for abdominal pain.  Musculoskeletal: Positive for joint swelling.  Neurological: Negative for weakness, numbness and headaches.    Allergies  Review of patient's allergies indicates no known allergies.  Home Medications   Current Outpatient Rx  Name Route Sig Dispense Refill  . AMLODIPINE BESYLATE 5 MG PO TABS Oral Take 1 tablet (5 mg total) by mouth daily. 30 tablet 0  . FOLIC ACID 1 MG PO TABS Oral Take 1 tablet (1 mg total) by  mouth daily. 30 tablet 0  . HYDROCHLOROTHIAZIDE 25 MG PO TABS Oral Take 25 mg by mouth daily.      Marland Kitchen PANTOPRAZOLE SODIUM 40 MG PO TBEC Oral Take 1 tablet (40 mg total) by mouth daily at 12 noon. 30 tablet 0  . THIAMINE HCL 100 MG PO TABS Oral Take 1 tablet (100 mg total) by mouth daily. 30 tablet 0    BP 151/114  Pulse 79  Temp(Src) 98.2 F (36.8 C) (Oral)  Resp 18  SpO2 98%  Physical Exam  Nursing note and vitals reviewed. Constitutional: She is oriented to person, place, and time. She appears well-developed and well-nourished. No distress.  HENT:  Head: Normocephalic and atraumatic.  Neck: Normal range of motion. Neck supple.  Cardiovascular: Normal rate and regular rhythm.   Pulmonary/Chest: Effort normal and breath sounds normal. No respiratory distress. She has no wheezes.  Musculoskeletal: She exhibits edema and tenderness.       Exam a left knee difficult given pain and patient cooperative. There is gross swelling to the knee and tenderness to palpation. Reduced range of motion secondary to pain. No gross deformity. Normal distal sensations and dorsal pedal pulse.  Neurological: She is alert and oriented to person, place, and time.  Skin: Skin is warm and dry.  No rash noted.  Psychiatric: She has a normal mood and affect. Her behavior is normal.    ED Course  Procedures    Dg Knee Complete 4 Views Left  03/02/2012  *RADIOLOGY REPORT*  Clinical Data: Fall  LEFT KNEE - COMPLETE 4+ VIEW  Comparison: None.  Findings: There is a fracture of the intercondylar notch with minimal displacement.  Moderate joint effusion.  Femur and patella are intact.  Mild osteopenia.  Minimal calcification in the medial collateral ligament.  IMPRESSION: Acute fracture involving the intercondylar notch of the tibial plateau.  This can be associated with ACL ligament avulsion.  Original Report Authenticated By: Donavan Burnet, M.D.     1. ACL tear   2. Tibial plateau fracture       MDM    11:30 PM patient seen and evaluated. Patient no acute distress.        Phill Mutter [place], [place] 03/03/12 [phone]

## 2012-03-02 NOTE — Discharge Instructions (Signed)
Your x-rays today showed a small fracture in your left knee concerning for an anterior cruciate ligament tear. You have been provided with a knee brace and crutches to use until you followup with orthopedic specialist. Use rest, ice, compression and elevation to reduce pain and swelling in your knee. Please followup with orthopedic specialist next week. Please also contact your primary care provider.   Tibial Fracture, Adult You have a fracture (break in bone) of your tibia. This is the large "shin" bone in your lower leg. These fractures are easily diagnosed with x-rays. TREATMENT  You have a simple fracture which usually will heal without disability. It can be treated with simple immobilization. This means the bone can be held with a cast or splint in a favorable position until your caregiver feels it is stable (healed well enough). Then you can begin range of motion exercises to keep your knee and ankle limber (moving well). HOME CARE INSTRUCTIONS   Apply ice to the injury for 15 to 20 minutes, 3 to 4 times per day while awake, for 2 days. Put the ice in a plastic bag and place a thin towel between the bag of ice and your cast.   If you have a plaster or fiberglass cast:   Do not try to scratch the skin under the cast using sharp or pointed objects.   Check the skin around the cast every day. You may put lotion on any red or sore areas.   Keep your cast dry and clean.   If you have a plaster splint:   Wear the splint as directed.   You may loosen the elastic around the splint if your toes become numb, tingle, or turn cold or blue.   Do not put pressure on any part of your cast or splint until it is fully hardened.   Your cast or splint can be protected during bathing with a plastic bag. Do not lower the cast or splint into water.   Use crutches as directed.   Only take over-the-counter or prescription medicines for pain, discomfort, or fever as directed by your caregiver.   See  your caregiver as directed. It is very important to keep all follow-up referrals and appointments in order to avoid any long-term problems with your leg and ankle including chronic pain, inability to move the ankle normally, failure of the fracture to heal and permanent disability.  SEEK IMMEDIATE MEDICAL CARE IF:   Pain is becoming worse rather than better, or if pain is uncontrolled with medications.   You have increased swelling, pain, or redness in the foot.   You begin to lose feeling in your foot or toes.   You develop a cold or blue foot or toes on the injured side.   You develop severe pain in your injured leg, especially if it is increased with movement of your toes.  Document Released: 07/19/2001 Document Revised: 10/13/2011 Document Reviewed: 02/10/2009 [facility] Patient Information 2012 [place], [place].   Recovery After Arthroscopy (with Knee Ligament Injury) You may have an internal derangement of the knee. This means something is wrong inside the knee. Your caregiver can make a more accurate diagnosis (learning what is wrong) by performing an arthroscopic procedure. Your knee has two layers of cartilage. Articular cartilage covers the bone ends. It lets your knee bend and move smoothly. Two menisci (thick pads of cartilage that form a rim inside the joint) help absorb shock. They stabilize your knee. Ligaments hold the bones together and support  your knee joint. Muscles move the joint, help support your knee, and take stress off the joint itself. Arthroscopy is a surgical technique. It allows your orthopedic surgeon to diagnose and treat your knee injury with accuracy. The surgeon looks into your knee through a small scope. The scope is like a small, pencil-sized telescope. Arthroscopy is less invasive than open-knee surgery. You can expect a more rapid recovery. Following your caregiver's instructions will help you recover rapidly and completely. Use crutches, rest, elevate, ice, and  do knee exercises as instructed. The length of recovery depends on various factors. These factors include type of injury, age, physical condition, medical conditions, and your compliance with your after surgery exercises. RECOVERY How long you will be away from your normal activities after arthroscopy will depend on the type and severity of knee problem you have. Rebuilding your muscles after arthroscopy helps ensure a full recovery. PROCEDURE The repair process used and recovery time after a ligamentous injury depends on the amount of damage you have. It also depends on whether or not treatment will require reconstructive knee surgery. With a ligament sprain, your recovery may take 6 to 8 weeks. A torn ligament needing reconstructive surgery may take 6 to 12 months to heal fully. During arthroscopy, your surgeon may find a partial or complete tear in a cruciate ligament. It is usually in the anterior cruciate. Because of their locations, collateral ligaments can not be viewed arthroscopically. If your caregiver suspects an injury to a collateral ligament, open surgery may be needed. In most cases several small incisions (cut by the surgeon) are made. Your torn cruciate is reconstructed by grafting other tissue into the injured area. If a collateral ligament is also injured, your surgeon may staple or suture the tear through a slightly larger incision on the side of the knee. AFTER THE PROCEDURE After surgery, you will be taken to the recovery area where a nurse will monitor your progress. When you are awake, stable, and taking fluids without complications, you will be allowed to go home. HOME CARE INSTRUCTIONS Ligaments take a long time to heal. For the first several weeks, you may be instructed to limit the amount of weight you put on your leg. A removable knee immobilizer or hinged splint may be used to support your knee. You can use crutches to help you get up and around. Recovery exercises can help you  regain strength and range of motion in your knee. Increased muscle strength helps support the knee. They allow faster return to normal activities. It may take a year before you are able to return to vigorous sport activity. In the future, you may need to wear a protective brace for some stop-and-go sports.  Once home, applying an ice pack for 15 to 20 minutes, 4 times per day to your operative site may help with discomfort. Ice may also keep the swelling down.   Only take over-the-counter or prescription medicines for pain, discomfort, or fever as directed by your caregiver.   You may resume normal diet and activities as directed.  SEEK MEDICAL CARE IF:   There is increased bleeding (more than a small spot) from the incision (wound) site.   You notice redness, swelling, or increasing pain in the incision site.   Pus is coming from the wound.   You notice a foul smell coming from the wound or dressing.   You develop increasing stiffness or pain in the knee.  SEEK IMMEDIATE MEDICAL CARE IF:  You have a  fever.   You develop a rash, difficulty breathing, or any allergic problems.  Document Released: 10/21/2000 Document Revised: 10/13/2011 Document Reviewed: 02/12/2009 [facility] Patient Information 2012 [place], [place].    RESOURCE GUIDE  Dental Problems  Patients with Medicaid: [facility] [phone] [address]                                           [phone] [address] Phone:  [phone]                                                  Phone:  [phone]  If unable to pay or uninsured, contact:  [facility] or [facility]. to become qualified for the adult dental clinic.  Chronic Pain Problems Contact [facility]  [phone] Patients need to be referred by their primary care doctor.  Insufficient Money for Medicine Contact United Way:  call "211" or [facility] 716-826-7679.  No Primary  Care Doctor Call [facility]  [phone] Other agencies that provide inexpensive medical care    [facility]  [phone]    [facility] Internal Medicine  [phone]    [facility]  [phone]    [facility]  [phone]    Planned Parenthood  3344715771    [facility]  [phone]  Psychological Services [facility] Behavioral Health  [phone] [facility] Services  [phone] [facility] Mental Health   [phone] (emergency services [phone])  Substance Abuse Resources [facility]  [phone] [facility] [phone] [facility] [place] [phone] Floydene Flock [phone] Residential & Outpatient Substance Abuse Program  913-192-3077  Abuse/Neglect [facility] Child Abuse Hotline 334 333 7601 [facility] Child Abuse Hotline [phone]) [phone] (After Hours)  Emergency Shelter [facility] (404)341-4324  Maternity Homes Room at the [place] [facility] [place] 437-855-9429 [facility] [phone]  MRSA Hotline #:   573-495-5644    [facility] Resources  [facility]     [employer]                          [facility] Dept. [address] [place]                       [address]      [address]  [place]                                                Cristobal Goldmann Phone:  [phone]  Phone:  [phone]                 Phone:  [phone]  [place] [facility] Phone:  [phone]  [place] Child Abuse Hotline (336) 342-1394 [phone] (After Hours)   
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
## 2012-03-02 NOTE — ED Notes (Signed)
Ortho paged. 

## 2012-03-02 NOTE — ED Notes (Signed)
Ice removed.

## 2012-03-03 NOTE — ED Provider Notes (Signed)
Medical screening examination/treatment/procedure(s) were performed by non-physician practitioner and as supervising physician I was immediately available for consultation/collaboration.  Jasmine Awe, MD 03/03/12 [phone]

## 2012-03-03 NOTE — ED Notes (Signed)
Pt states that she was chasing after her grandson and fell directly on her knee, twisting it.  Pt unable to place any pressure on foot, or move knee d/t pain.  L knee swelling.

## 2012-07-18 ENCOUNTER — Ambulatory Visit (INDEPENDENT_AMBULATORY_CARE_PROVIDER_SITE_OTHER): Payer: Medicaid Other | Admitting: Psychiatry

## 2012-07-18 ENCOUNTER — Encounter (HOSPITAL_COMMUNITY): Payer: Self-pay | Admitting: Psychiatry

## 2012-07-18 DIAGNOSIS — F4324 Adjustment disorder with disturbance of conduct: Secondary | ICD-10-CM
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
## 2012-08-15 ENCOUNTER — Ambulatory Visit (HOSPITAL_COMMUNITY): Payer: Self-pay | Admitting: Psychiatry

## 2012-09-21 ENCOUNTER — Ambulatory Visit (INDEPENDENT_AMBULATORY_CARE_PROVIDER_SITE_OTHER): Payer: Medicaid Other | Admitting: Psychiatry

## 2012-09-21 DIAGNOSIS — F39 Unspecified mood [affective] disorder: Secondary | ICD-10-CM
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
## 2012-11-28 ENCOUNTER — Ambulatory Visit (HOSPITAL_COMMUNITY): Payer: Self-pay | Admitting: Psychiatry

## 2012-12-19 ENCOUNTER — Ambulatory Visit: Payer: Self-pay | Admitting: Internal Medicine

## 2012-12-27 ENCOUNTER — Ambulatory Visit: Payer: Self-pay | Admitting: Internal Medicine

## 2013-01-09 ENCOUNTER — Encounter: Payer: Self-pay | Admitting: Internal Medicine

## 2013-01-09 ENCOUNTER — Ambulatory Visit (INDEPENDENT_AMBULATORY_CARE_PROVIDER_SITE_OTHER): Payer: Medicaid Other | Admitting: Internal Medicine

## 2013-01-09 VITALS — BP 142/99 | HR 78 | Temp 96.8°F | Ht 64.0 in | Wt 117.9 lb

## 2013-01-09 DIAGNOSIS — Z0000 Encounter for general adult medical examination without abnormal findings: Secondary | ICD-10-CM

## 2013-01-09 DIAGNOSIS — M79609 Pain in unspecified limb: Secondary | ICD-10-CM

## 2013-01-09 DIAGNOSIS — M79622 Pain in left upper arm: Secondary | ICD-10-CM

## 2013-01-09 DIAGNOSIS — Z23 Encounter for immunization: Secondary | ICD-10-CM

## 2013-01-09 DIAGNOSIS — F17200 Nicotine dependence, unspecified, uncomplicated: Secondary | ICD-10-CM | POA: Insufficient documentation

## 2013-01-09 DIAGNOSIS — F4324 Adjustment disorder with disturbance of conduct: Secondary | ICD-10-CM

## 2013-01-09 DIAGNOSIS — R3129 Other microscopic hematuria: Secondary | ICD-10-CM

## 2013-01-09 DIAGNOSIS — I10 Essential (primary) hypertension: Secondary | ICD-10-CM

## 2013-01-09 DIAGNOSIS — IMO0002 Reserved for concepts with insufficient information to code with codable children: Secondary | ICD-10-CM

## 2013-01-09 DIAGNOSIS — D4101 Neoplasm of uncertain behavior of right kidney: Secondary | ICD-10-CM

## 2013-01-09 DIAGNOSIS — N83201 Unspecified ovarian cyst, right side: Secondary | ICD-10-CM

## 2013-01-09 DIAGNOSIS — A539 Syphilis, unspecified: Secondary | ICD-10-CM

## 2013-01-09 DIAGNOSIS — F1910 Other psychoactive substance abuse, uncomplicated: Secondary | ICD-10-CM

## 2013-01-09 LAB — COMPREHENSIVE METABOLIC PANEL
AST: 17 U/L (ref 0–37)
Albumin: 4.8 g/dL (ref 3.5–5.2)
Alkaline Phosphatase: 109 U/L (ref 39–117)
BUN: 13 mg/dL (ref 6–23)
Potassium: 3.9 mEq/L (ref 3.5–5.3)
Sodium: 141 mEq/L (ref 135–145)
Total Bilirubin: 0.2 mg/dL — ABNORMAL LOW (ref 0.3–1.2)

## 2013-01-09 LAB — CBC WITH DIFFERENTIAL/PLATELET
Basophils Absolute: 0 10*3/uL (ref 0.0–0.1)
Basophils Relative: 0 % (ref 0–1)
MCHC: 34.8 g/dL (ref 30.0–36.0)
Monocytes Absolute: 0.5 10*3/uL (ref 0.1–1.0)
Neutro Abs: 6.3 10*3/uL (ref 1.7–7.7)
Neutrophils Relative %: 56 % (ref 43–77)
RDW: 14.2 % (ref 11.5–15.5)

## 2013-01-09 MED ORDER — HYDROCHLOROTHIAZIDE 25 MG PO TABS: 25.0000 mg | ORAL_TABLET | Freq: Every day | ORAL | Status: DC

## 2013-01-09 MED ORDER — BLOOD PRESSURE MONITOR KIT: PACK | Status: DC

## 2013-01-09 MED ORDER — AMLODIPINE BESYLATE 5 MG PO TABS: 5.0000 mg | ORAL_TABLET | Freq: Every day | ORAL | Status: DC

## 2013-01-09 NOTE — Patient Instructions (Addendum)
Please continue to take your medications as prescribed and check your blood pressure at home and record your values and bring back to clinic  You will get your flu shot today  We have sent you for mammogram today and will need to look records for your colonoscopy  Try icing your arm for 15 minutes at a time every night for a couple of nights, if it is not better, call the clinic and we may need to do imaging [phone]  Please stop smoking and consider calling [id number]  Please decrease alcohol intake as we discussed today

## 2013-01-10 ENCOUNTER — Encounter: Payer: Self-pay | Admitting: Internal Medicine
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
## 2013-01-10 NOTE — Assessment & Plan Note (Signed)
BP Readings from Last 3 Encounters:  01/09/13 142/99  03/02/12 151/114  11/11/11 150/92   Lab Results  Component Value Date   NA 141 01/09/2013   K 3.9 01/09/2013   CREATININE 0.90 01/09/2013   Assessment:  Blood pressure control: mildly elevated  Progress toward BP goal:  unable to assess  Plan:  Medications:  Continue current medications--Norvasc 5mg  and HCTZ 25mg   Educational resources provided:    Self management tools provided:    Other plans: monitor BP at home and continue current regimen

## 2013-01-10 NOTE — Assessment & Plan Note (Addendum)
Follows with Dr. Donell Beers at behavioral health.  Last seen November 2013.  Recommended to return to [facility] for follow up.    -on Tegretol for mood disorder and claims it has been working well.  Continue. -f/u behavioral health

## 2013-01-10 NOTE — Progress Notes (Signed)
Subjective:   Patient ID: Roberta Miller female   DOB: 18-Jul-1956 57 y.o.   MRN: [id number]  HPI: Roberta Miller is a 57 y.o. African American female with PMH of HTN, chronic alcohol and tobacco abuse, adjustment/conduct disorder presenting to clinic today for new patient visit to establish care. Her niece who helps care for her is present in the room as well.  Roberta Miller used to be a patient at [facility].    Today, she complains of left arm pain/discomfort for past 2-3 weeks, acute onset, no known cause, no reported trauma, no radiation, and has not tried any alleviating factors.    Otherwise, she has no major complaints.  Denies any N/V/D, fever, chills, chest pain, shortness of breath, abdominal pain or any urinary complaints at this time.  She smokes approximately 1/2 ppd and drinks 40oz beer every other day.  We have discussed cessation in detail today but she does not seem ready but is willing to cut down.    In regards to health maintenance: she claims she has definitely had a colonoscopy before but we need records.  She will get flu vaccine today and will be referred for mammogram.    Past Medical History  Diagnosis Date  . Hypertension   . Pancreatitis   . Substance abuse     alcohol and tobacco   Current Outpatient Prescriptions  Medication Sig Dispense Refill  . amLODipine (NORVASC) 5 MG tablet Take 1 tablet (5 mg total) by mouth daily.  30 tablet  2  . carbamazepine (TEGRETOL) 200 MG tablet Take 200 mg by mouth 2 (two) times daily.      . hydrochlorothiazide (HYDRODIURIL) 25 MG tablet Take 1 tablet (25 mg total) by mouth daily.  30 tablet  2  . pantoprazole (PROTONIX) 40 MG tablet Take 1 tablet (40 mg total) by mouth daily at 12 noon.  30 tablet  0  . Blood Pressure Monitor KIT To check blood pressure daily  1 each  0   No current facility-administered medications for this visit.   History reviewed. No pertinent family history. History   Social History  . Marital  Status: Single    Spouse Name: N/A    Number of Children: N/A  . Years of Education: N/A   Social History Main Topics  . Smoking status: Current Every Day Smoker -- 1.00 packs/day    Types: Cigarettes  . Smokeless tobacco: Never Used  . Alcohol Use: 0.5 oz/week    1 drink(s) per week     Comment: Drinks beer. 40oz every other day  . Drug Use: No     Comment: 1/2ppd  . Sexually Active: No   Other Topics Concern  . None   Social History Narrative  . None   Review of Systems: Constitutional: Denies fever, chills, diaphoresis, appetite change and fatigue.  HEENT: Denies photophobia, eye pain, redness, hearing loss, ear pain, congestion, sore throat, rhinorrhea, sneezing, mouth sores, trouble swallowing, neck pain, neck stiffness and tinnitus.   Respiratory: Denies SOB, DOE, cough, chest tightness,  and wheezing.   Cardiovascular: Denies chest pain, palpitations and leg swelling.  Gastrointestinal: Denies nausea, vomiting, abdominal pain, diarrhea, constipation, blood in stool and abdominal distention.  Genitourinary: Denies dysuria, urgency, frequency, hematuria, flank pain and difficulty urinating.  Musculoskeletal: L arm pain.  Denies back pain, joint swelling, arthralgias and gait problem.  Skin: Denies pallor, rash and wound.  Neurological: Denies dizziness, seizures, syncope, weakness, light-headedness, numbness and headaches.  Hematological:  Denies adenopathy. Easy bruising, personal or family bleeding history  Psychiatric/Behavioral: Denies suicidal ideation, mood changes, confusion, nervousness, sleep disturbance and agitation. ? Mild MR  Objective:  Physical Exam: Filed Vitals:   01/09/13 1540  BP: 142/99  Pulse: 78  Temp: 96.8 F (36 C)  TempSrc: Oral  Height: 5\' 4"  (1.626 m)  Weight: 117 lb 14.4 oz (53.479 kg)  SpO2: 99%   Constitutional: Vital signs reviewed.  Patient is a well-developed and well-nourished female in no acute distress and cooperative with exam.  Alert and oriented x3.  Head: Normocephalic and atraumatic Ear: TM normal bilaterally Mouth: no erythema or exudates, MMM.  Poor dentition.   Eyes: PERRL, EOMI, conjunctivae normal, No scleral icterus.  Neck: Supple, Trachea midline normal ROM Cardiovascular: RRR, S1 normal, S2 normal, no MRG, pulses symmetric and intact bilaterally Pulmonary/Chest: CTAB, no wheezes, rales, or rhonchi Abdominal: Soft. Non-tender, non-distended, bowel sounds are normal, no masses, organomegaly, or guarding present.  GU: no CVA tenderness Musculoskeletal: No joint deformities, erythema, or stiffness, ROM full.  Left lateral surface of inner arm area of bicep tender to deep palpation, point tenderness, no palpable mass.  No pain radiation worse with lateral flexion.   Darkened visible scratch mark.   Hematology: no cervical, inginal, or axillary adenopathy.  Neurological: A&O x3, Strength is normal and symmetric bilaterally, cranial nerve II-XII are grossly intact, no focal motor deficit, sensory intact to light touch bilaterally. ?mild MR.  Skin: Warm, dry and intact. No rash, cyanosis, or clubbing.  Psychiatric: Normal mood and affect. speech and behavior is normal. Judgment and thought content normal. Cognition and memory are normal.   Assessment & Plan:  Discussed with Dr. Kem Kays  HTN: continue norvasc and HCTZ L Upper arm pain: ?strain.  Conservative tx for now, if fails may need imaging Health maintenance: flu vaccine today and referred for mammogram and claims to have had a colonoscopy before

## 2013-01-10 NOTE — Assessment & Plan Note (Signed)
Counseled extensively on alcohol and tobacco cessation.  Gave recommendations on cutting down cigarettes daily and offered medications and assistance as needed.  Also discussed 1800QUITNOW.  -cessation STRONGLY advised -1800 quit now -continue to follow

## 2013-01-14 ENCOUNTER — Encounter: Payer: Self-pay | Admitting: Internal Medicine

## 2013-01-14 ENCOUNTER — Telehealth: Payer: Self-pay | Admitting: Internal Medicine

## 2013-01-14 ENCOUNTER — Other Ambulatory Visit: Payer: Self-pay | Admitting: Internal Medicine

## 2013-01-14 DIAGNOSIS — N83201 Unspecified ovarian cyst, right side: Secondary | ICD-10-CM

## 2013-01-14 DIAGNOSIS — N281 Cyst of kidney, acquired: Secondary | ICD-10-CM | POA: Insufficient documentation

## 2013-01-14 DIAGNOSIS — IMO0002 Reserved for concepts with insufficient information to code with codable children: Secondary | ICD-10-CM | POA: Insufficient documentation

## 2013-01-14 NOTE — Telephone Encounter (Signed)
Talked to [facility], who stated she will be taking care of the pt now. Requested she be called first; telephone and address changed per her request also.

## 2013-01-14 NOTE — Telephone Encounter (Signed)
Mammogram appt given to [facility] and informed us has to be pre-certed.

## 2013-01-14 NOTE — Telephone Encounter (Signed)
I called [name] to review her results of prior right ovarian cyst and the recommendation to get a follow up ultrasound in one year that was overdue.  She is an agreement to getting the ultrasound but wanted me to call her sister, Roberta Miller to go over the information.    I then called [name] and spoke to her, updating her on the need for follow up ultrasound.  She is also in agreement and would like scheduling to happen with her so that she can arrange for [name] to get the ultrasound.  She also updated the phone number of [name]--Roberta Miller who also helps take care of her.    I have placed order for ultrasound and will have RN call [name] and also let Roberta Miller know as well in regards to the appointment.    -Discussed with Dr. Kem Kays

## 2013-01-14 NOTE — Progress Notes (Signed)
i spoke to both the patient and her sister.  She will need to be scheduled for ultrasound by speaking to her sister for best time.

## 2013-01-14 NOTE — Progress Notes (Signed)
Great, thanks

## 2013-01-14 NOTE — Progress Notes (Signed)
Hi Samaya, do you mind calling the patient to let her know and document you spoke to her? Thanks.

## 2013-01-23 NOTE — Addendum Note (Signed)
Addended by: Baltazar Apo on: 01/23/2013 11:36 AM   Modules accepted: Orders

## 2013-01-28 ENCOUNTER — Ambulatory Visit (HOSPITAL_COMMUNITY)
Admission: RE | Admit: 2013-01-28 | Discharge: 2013-01-28 | Disposition: A | Discharge status: Home / Self Care | Payer: Medicaid Other | Source: Ambulatory Visit | Attending: Internal Medicine | Admitting: Internal Medicine

## 2013-01-28 ENCOUNTER — Other Ambulatory Visit: Payer: Self-pay | Admitting: Internal Medicine

## 2013-01-28 DIAGNOSIS — N83201 Unspecified ovarian cyst, right side: Secondary | ICD-10-CM

## 2013-01-28 DIAGNOSIS — N83209 Unspecified ovarian cyst, unspecified side: Secondary | ICD-10-CM | POA: Insufficient documentation

## 2013-02-04 ENCOUNTER — Ambulatory Visit
Admission: RE | Admit: 2013-02-04 | Discharge: 2013-02-04 | Disposition: A | Discharge status: Home / Self Care | Payer: Medicaid Other | Source: Ambulatory Visit | Attending: Internal Medicine | Admitting: Internal Medicine

## 2013-02-04 DIAGNOSIS — Z0000 Encounter for general adult medical examination without abnormal findings: Secondary | ICD-10-CM

## 2013-02-13 ENCOUNTER — Encounter: Payer: Self-pay | Admitting: Internal Medicine

## 2013-02-13 DIAGNOSIS — R269 Unspecified abnormalities of gait and mobility: Secondary | ICD-10-CM | POA: Insufficient documentation

## 2013-03-26 ENCOUNTER — Telehealth (HOSPITAL_COMMUNITY): Payer: Self-pay

## 2013-03-27 NOTE — Telephone Encounter (Signed)
Ok with me as long as the requester has patient permission. Send last office note

## 2013-05-15 ENCOUNTER — Other Ambulatory Visit: Payer: Self-pay | Admitting: Internal Medicine

## 2013-05-15 ENCOUNTER — Telehealth: Payer: Self-pay | Admitting: Internal Medicine

## 2013-05-15 ENCOUNTER — Other Ambulatory Visit: Payer: Self-pay | Admitting: *Deleted

## 2013-05-15 MED ORDER — HYDROCHLOROTHIAZIDE 25 MG PO TABS: 25.0000 mg | ORAL_TABLET | Freq: Every day | ORAL | Status: DC

## 2013-05-15 NOTE — Telephone Encounter (Signed)
Message copied by Baltazar Apo on Wed May 15, 2013  5:31 PM ------      Message from: Hassan Buckler      Created: Wed May 15, 2013  4:29 PM       Shaunika Italiano called - requesting result from Pelvis U/S done 3/19 on Ms Upadhyay. Work# [id number]; cell# [phone]. Thanks ------

## 2013-05-15 NOTE — Telephone Encounter (Signed)
Message copied by Baltazar Apo on Wed May 15, 2013  5:26 PM ------      Message from: Hassan Buckler      Created: Wed May 15, 2013  4:29 PM       Vilma Will called - requesting result from Pelvis U/S done 3/19 on Ms Carmicheal. Work# [id number]; cell# [phone]. Thanks ------

## 2013-05-15 NOTE — Telephone Encounter (Signed)
Discussed results of ultrasound on 3/19 with caregiver Maleaha Hughett explaining that the study was not well tolerated by patient at that time and renal cyst that was needed to be visualized was not seen due to poor quality of study.  Ms. Harvell will need to return to clinic for BP check and we will also need to discuss best further work up of the renal cyst and hx of hematuria.  Since she doesn't tolerate the procedures well on her own, she may benefit from renal referral.  I will discuss this further with Dr. Kem Kays who saw this patient with me in March.   I have sent a message to the front desk to have Ms. Nienaber scheduled for an appointment this month.

## 2013-05-15 NOTE — Telephone Encounter (Signed)
Was ms. Holck supposed to follow up after her march visit? If so, can we please schedule her to be seen in [facility].  Thank you

## 2013-05-16 NOTE — Telephone Encounter (Signed)
The renal lesions appears insignificant on MRI. I would repeat her UA to see if she has persistent hematuria, if so, she will need referral to urology and repeat renal U/S. As far as the ovarian cysts, since she does not tolerate TV U/S, I would go ahead and refer her to Gyn for further evaluation.

## 2013-05-21 ENCOUNTER — Encounter: Payer: Self-pay | Admitting: Internal Medicine

## 2013-05-21 ENCOUNTER — Ambulatory Visit (INDEPENDENT_AMBULATORY_CARE_PROVIDER_SITE_OTHER): Payer: Medicaid Other | Admitting: Internal Medicine

## 2013-05-21 VITALS — BP 131/88 | HR 68 | Temp 97.0°F | Ht 64.5 in | Wt 117.8 lb

## 2013-05-21 DIAGNOSIS — R3129 Other microscopic hematuria: Secondary | ICD-10-CM

## 2013-05-21 DIAGNOSIS — I10 Essential (primary) hypertension: Secondary | ICD-10-CM

## 2013-05-21 MED ORDER — PANTOPRAZOLE SODIUM 40 MG PO TBEC: 40.0000 mg | DELAYED_RELEASE_TABLET | Freq: Every day | ORAL | Status: DC

## 2013-05-21 NOTE — Patient Instructions (Addendum)
We have referred you to an OB Gyn for follow-up of ovarian cyst.  We will call you with the results of the urine test.

## 2013-05-21 NOTE — Progress Notes (Signed)
Subjective:   Patient ID: Roberta Miller female   DOB: 1956/08/16 57 y.o.   MRN: [id number]  HPI: Roberta Miller is a 57 y.o. female with a past medical history detailed below who comes to the clinic today to re-establish care. The patient has no acute complaints. Please see assessment and plan regarding specific chronic medical problems.    Past Medical History  Diagnosis Date  . Hypertension   . Pancreatitis   . Substance abuse     alcohol and tobacco   Current Outpatient Prescriptions  Medication Sig Dispense Refill  . carbamazepine (TEGRETOL) 200 MG tablet Take 200 mg by mouth 2 (two) times daily.      . hydrochlorothiazide (HYDRODIURIL) 25 MG tablet Take 1 tablet (25 mg total) by mouth daily.  90 tablet  3  . amLODipine (NORVASC) 5 MG tablet Take 1 tablet (5 mg total) by mouth daily.  30 tablet  2  . Blood Pressure Monitor KIT To check blood pressure daily  1 each  0  . pantoprazole (PROTONIX) 40 MG tablet Take 1 tablet (40 mg total) by mouth daily at 12 noon.  30 tablet  0   No current facility-administered medications for this visit.   Family History  Problem Relation Age of Onset  . Cancer Mother     died of breast cancer  . Alcoholism Mother   . Hypertension Sister   . Hypertension Brother   . Diabetes Brother    History   Social History  . Marital Status: Single    Spouse Name: N/A    Number of Children: N/A  . Years of Education: N/A   Social History Main Topics  . Smoking status: Current Every Day Smoker -- 0.50 packs/day    Types: Cigarettes  . Smokeless tobacco: Never Used     Comment: 1/2ppd  . Alcohol Use: 0.5 oz/week    1 drink(s) per week     Comment: Drinks beer. 40oz every other day  . Drug Use: No     Comment: hx of cocaine use  . Sexually Active: No   Other Topics Concern  . None   Social History Narrative   Lives alone   Niece power of attorney   Gets SSI   Single         Review of Systems:  Review of Systems    Constitutional: Negative for fever and chills.  Eyes: Negative for blurred vision and double vision.  Respiratory: Negative for cough, shortness of breath and wheezing.   Cardiovascular: Negative for chest pain and palpitations.  Gastrointestinal: Positive for heartburn. Negative for nausea, vomiting, abdominal pain, diarrhea, constipation, blood in stool and melena.  Genitourinary: Negative for dysuria, urgency and frequency.  Skin: Negative for rash.  Neurological: Negative for headaches.     Objective:  Physical Exam: Filed Vitals:   05/21/13 1536  BP: 131/88  Pulse: 68  Temp: 97 F (36.1 C)  TempSrc: Oral  Height: 5' 4.5" (1.638 m)  Weight: 117 lb 12.8 oz (53.434 kg)  SpO2: 99%   Physical Exam  Constitutional: She is oriented to person, place, and time. She appears well-developed and well-nourished.  HENT:  Head: Normocephalic and atraumatic.  Eyes: EOM are normal. Pupils are equal, round, and reactive to light.  Cardiovascular: Normal rate, regular rhythm, normal heart sounds and intact distal pulses.  Exam reveals no gallop and no friction rub.   No murmur heard. Pulmonary/Chest: Effort normal and breath sounds normal. No respiratory distress. She  has no wheezes. She has no rales.  Abdominal: Soft. Bowel sounds are normal. She exhibits no distension and no mass. There is no rebound and no guarding.  Diffuse mild tenderness  Neurological: She is alert and oriented to person, place, and time.  Skin: Skin is warm.  Psychiatric:  Mood and affect flat. Not interactive.    Assessment & Plan:

## 2013-05-21 NOTE — Assessment & Plan Note (Signed)
BP Readings from Last 3 Encounters:  05/21/13 131/88  01/09/13 142/99  03/02/12 151/114    Lab Results  Component Value Date   NA 141 01/09/2013   K 3.9 01/09/2013   CREATININE 0.90 01/09/2013    Assessment: Blood pressure control:  <140/90 Progress toward BP goal:   At goal Comments: For the last year, the patient has not been taking amlodipine. The patient has been taking HCTZ daily. The patients BP has been well controlled on only HCTZ.  Plan: Medications:  Today we discontinued amlodipine and continued HCTZ. Educational resources provided: brochure;video Self management tools provided: home blood pressure logbook

## 2013-05-21 NOTE — Assessment & Plan Note (Signed)
Assessment:  The patient has hematuria in the presence of renal lesion and ovarian cyst.  The patient was unable to tolerate transvaginal ultrasound, and therefore, her ovaries were unable to be imaged at her last round of imaging. Of note, the patients family has an extensive history of breast and ovarian cancer (BCx3 and OCx1).    Plan:  Today we ordered a UA and plan to follow up as indicated. We also referred the patient to OBGyn for management of ovarian cyst. The patient and her family member is in agreement with this plan.

## 2013-05-22 LAB — URINALYSIS, ROUTINE W REFLEX MICROSCOPIC
Bilirubin Urine: NEGATIVE
Glucose, UA: NEGATIVE mg/dL
Ketones, ur: NEGATIVE mg/dL
Specific Gravity, Urine: 1.013 (ref 1.005–1.030)
Urobilinogen, UA: 0.2 mg/dL (ref 0.0–1.0)

## 2013-05-22 LAB — URINALYSIS, MICROSCOPIC ONLY
Bacteria, UA: NONE SEEN
Casts: NONE SEEN
Crystals: NONE SEEN

## 2013-05-23 NOTE — Progress Notes (Signed)
I saw and evaluated the patient.  I personally confirmed the key portions of the history and exam documented by Dr. Komanski and I reviewed pertinent patient test results.  The assessment, diagnosis, and plan were formulated together and I agree with the documentation in the resident's note.  

## 2013-06-25 ENCOUNTER — Encounter: Payer: Self-pay | Admitting: Obstetrics & Gynecology

## 2013-08-09 ENCOUNTER — Encounter: Payer: Self-pay | Admitting: Obstetrics & Gynecology

## 2013-09-11 ENCOUNTER — Encounter: Payer: Self-pay | Admitting: Obstetrics & Gynecology

## 2013-09-11 ENCOUNTER — Ambulatory Visit (INDEPENDENT_AMBULATORY_CARE_PROVIDER_SITE_OTHER): Payer: Medicaid Other | Admitting: Obstetrics & Gynecology

## 2013-09-11 ENCOUNTER — Other Ambulatory Visit (HOSPITAL_COMMUNITY)
Admission: RE | Admit: 2013-09-11 | Discharge: 2013-09-11 | Disposition: A | Discharge status: Home / Self Care | Payer: Medicaid Other | Source: Ambulatory Visit | Attending: Obstetrics & Gynecology | Admitting: Obstetrics & Gynecology

## 2013-09-11 VITALS — BP 133/96 | HR 64 | Temp 97.8°F | Ht 66.0 in | Wt 114.2 lb

## 2013-09-11 DIAGNOSIS — Z01419 Encounter for gynecological examination (general) (routine) without abnormal findings: Secondary | ICD-10-CM | POA: Insufficient documentation

## 2013-09-11 DIAGNOSIS — Z1151 Encounter for screening for human papillomavirus (HPV): Secondary | ICD-10-CM | POA: Insufficient documentation

## 2013-09-11 NOTE — Progress Notes (Signed)
Subjective:     Patient ID: Roberta Miller, female   DOB: [birth year], 57 y.o.   MRN: [id number]  HPI Pt came in for eval of ovarina cyst but, her last sono failed to show a cyst.  She came in with a care taker. After review found that pt was due for a PAP.  A pap was done.  Pt without complaints.    Review of Systems     Objective:   Physical Exam BP 133/96  Pulse 64  Temp(Src) 97.8 F (36.6 C) (Oral)  Ht 5\' 6"  (1.676 m)  Wt 114 lb 3.2 oz (51.801 kg)  BMI 18.44 kg/m2 Abd: soft, NT, ND GU: EGBUS: no lesions Vagina: no blood in vault Cervix: no lesion; no mucopurulent d/c Uterus: small, mobile Adnexa: no masses; non tender        01/28/2013 Clinical Data: Right ovarian cyst follow-up. The patient is 57  years old. According to the sonographer, the patient requested that  the transvaginal imaging be stopped soon after the vaginal probe  was placed, due to discomfort. Therefore, this exam is charged is  a transabdominal study only.  TRANSABDOMINAL ULTRASOUND OF PELVIS  Technique: Transabdominal ultrasound examination of the pelvis was  performed including evaluation of the uterus, ovaries, adnexal  regions, and pelvic cul-de-sac.  Comparison: CT abdomen pelvis without contrast 11/15/2011  Findings:  Transvaginal imaging was initiated, but quickly terminated due to  patient discomfort and patient request. Soon after this, the  patient stated that she needed to void the bladder, which made be  transabdominal imaging somewhat technically difficult.  Uterus: Not well visualized with transabdominal imaging. Measures  approximate 5.7 x 2.6 x 5.3 cm. No focal uterine mass is  identified.  Endometrium: Measures approximately 7 mm thickness, measured  transabdominally.  Right ovary: Not visualized. No right adnexal mass is seen.  Left ovary: Not visualized. No left adnexal mass is seen.  Other Findings: No free fluid  IMPRESSION:  1. Limited transabdominal pelvic ultrasound as  described above.  The ovaries are not visualized. No adnexal mass is seen.  2. Uterus within normal limits.   Assessment:    Ovarian cyst- resolved. Last sono normal. Pt with no sx  Annual GYN exam- normal exam     Plan:     F/u PAP F/u 1 year for annual or sooner prm

## 2013-09-11 NOTE — Patient Instructions (Signed)

## 2013-09-16 ENCOUNTER — Other Ambulatory Visit: Payer: Self-pay | Admitting: Obstetrics & Gynecology

## 2013-09-16 DIAGNOSIS — A599 Trichomoniasis, unspecified: Secondary | ICD-10-CM

## 2013-09-16 MED ORDER — METRONIDAZOLE 500 MG PO TABS: ORAL_TABLET | ORAL | Status: DC

## 2013-09-17 ENCOUNTER — Telehealth: Payer: Self-pay

## 2013-09-17 NOTE — Telephone Encounter (Signed)
Called pt. And left a message stating we have some information for her if she could call the clinic during office hours.

## 2013-09-17 NOTE — Telephone Encounter (Signed)
Message copied by Louanna Raw on Tue Sep 17, 2013  4:06 PM ------      Message from: Willodean Rosenthal      Created: Mon Sep 16, 2013  5:24 PM       Please call pt.  Her pap showed Trich. She needs to pick up tx.            clh-S ------

## 2013-09-24 NOTE — Telephone Encounter (Signed)
Called pt.-- no answer. Left message stating we are trying to get a hold of her with some information if she could call back during office hours.

## 2013-09-25 NOTE — Telephone Encounter (Signed)
Letter sent to patient via certified mail to inform her of infection and to pick up prescription.

## 2013-09-25 NOTE — Telephone Encounter (Signed)
Called pt. Niece Rosalin picked up phone and stated she was not with her aunt today but that she and her aunt have been trying to get a hold of the office. There is no release of information or HIPPA form signed stating i can give information to Rosalin and therefore informed Rosalin that I could not give her information. Rosalin verbalized understanding. Rosalin states that pt. Would not be able to understand-- has some learning disabilities. Rosalin stated she will try to call Friday Morning during which time she will be with patient. I stated I would attempt to reach them Friday morning as well so that pt. Can receive information. Rosalin verbalized understanding and gratitude and had no further questions.

## 2013-09-30 ENCOUNTER — Encounter: Payer: Self-pay | Admitting: *Deleted

## 2013-10-02 ENCOUNTER — Encounter: Payer: Self-pay | Admitting: *Deleted

## 2014-03-28 ENCOUNTER — Telehealth (HOSPITAL_COMMUNITY): Payer: Self-pay

## 2014-06-06 ENCOUNTER — Ambulatory Visit (INDEPENDENT_AMBULATORY_CARE_PROVIDER_SITE_OTHER): Payer: Medicaid Other | Admitting: Psychiatry

## 2014-06-06 ENCOUNTER — Encounter (INDEPENDENT_AMBULATORY_CARE_PROVIDER_SITE_OTHER): Payer: Self-pay

## 2014-06-06 DIAGNOSIS — F4323 Adjustment disorder with mixed anxiety and depressed mood: Secondary | ICD-10-CM

## 2014-06-06 DIAGNOSIS — F3162 Bipolar disorder, current episode mixed, moderate: Secondary | ICD-10-CM

## 2014-06-06 MED ORDER — CARBAMAZEPINE 200 MG PO TABS: 200.0000 mg | ORAL_TABLET | Freq: Two times a day (BID) | ORAL | Status: DC

## 2014-06-06 NOTE — Progress Notes (Signed)
Psychiatric Assessment Adult  Patient Identification:  Roberta Miller Date of Evaluation:  06/06/2014 Chief Complaint: Irritable History of Chief Complaint:  No chief complaint on file.  this patient is a patient who is well known to me. 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 The patient clearly is cognitively impaired. Fortunately she however seems to be able to live on her own with assistance. When I met her she recently had been removed from her living environment and was forced to live with a daughter who does not want her there. Today the patient is seen with her sister Roberta Miller and her niece Roberta Miller. The patient continues to live with her daughter, Roberta Miller he does not want to come to the session. The patient admits that since November being off of Tegretol she is worse. She feels more explosive and tense. She's not really been violent. The patient is not dangerous to herself or others. The patient is somewhat manipulative and dramatic. She's never made a suicide attempt. The patient denies being depressed on a daily basis. She is sleeping and eating well. She's got good energy. She enjoys soap operas and music. This patient denies the use of alcohol or drugs. There is no evidence of mania in this individual. The patient does not have a specific anxiety disorder at this time. It is not clear why the patient did not come back. The patient was seen 2 times and on the second visit was showing a response to Tegretol. This patient has never been a psychiatric hospital before. At this time she clearly needs to be back on Tegretol.  HPI Review of Systems Physical Exam  Depressive Symptoms: psychomotor agitation,  (Hypo) Manic Symptoms:   Elevated Mood:  No Irritable Mood: yes Grandiosity:  No Distractibility:  No Labiality of Mood:  Yes Delusions:  No Hallucinations:  No Impulsivity:  Yes Sexually Inappropriate Behavior:  No Financial Extravagance:   No Flight of Ideas:  No  Anxiety Symptoms: Excessive Worry:  No Panic Symptoms:  No Agoraphobia:  No Obsessive Compulsive: No  Symptoms:  Specific Phobias:   Social Anxiety:    Psychotic Symptoms:  Hallucinations: No None Delusions:  No Paranoia:  No   Ideas of Reference:  No  PTSD Symptoms: Ever had a traumatic exposure:  No Had a traumatic exposure in the last month:  No Re-experiencing: No None Hypervigilance:  No Hyperarousal: No None Avoidance: No None  Traumatic Brain Injury: Yes   Past Psychiatric History: Diagnosis: MR  Hospitalizations:   Outpatient Care:   Substance Abuse Care:   Self-Mutilation:   Suicidal Attempts:   Violent Behaviors:    Past Medical History:   Past Medical History  Diagnosis Date  . Hypertension   . Pancreatitis   . Substance abuse     alcohol and tobacco   History of Loss of Consciousness:   Seizure History:   Cardiac History:   Allergies:   Allergies  Allergen Reactions  . Other Rash    Tomatoes and Chocolate    Current Medications:  Current Outpatient Prescriptions  Medication Sig Dispense Refill  . Blood Pressure Monitor KIT To check blood pressure daily  1 each  0  . carbamazepine (TEGRETOL) 200 MG tablet Take 1 tablet (200 mg total) by mouth 2 (two) times daily.  60 tablet  3  . hydrochlorothiazide (HYDRODIURIL) 25 MG tablet Take 1 tablet (25 mg total) by mouth daily.  90 tablet  3  .  metroNIDAZOLE (FLAGYL) 500 MG tablet Take two tablets by mouth twice a day, for one day.  Or you can take all four tablets at once if you can tolerate it.  4 tablet  0  . pantoprazole (PROTONIX) 40 MG tablet Take 1 tablet (40 mg total) by mouth daily at 12 noon.  30 tablet  12   No current facility-administered medications for this visit.    Previous Psychotropic Medications:  Medication Dose                          Substance Abuse History in the last 12 months: Substance Age of 1st Use Last Use Amount Specific Type                                                                                               Medical Consequences of Substance Abuse:   Legal Consequences of Substance Abuse:   Family Consequences of Substance Abuse:   Blackouts:   DT's:   Withdrawal Symptoms:   None  Social History: [place]  Family Members:  Marital Status:   Children:   Sons:   Daughters: 1 Relationships:  Education:   Educational Problems/Performance:  Religious Beliefs/Practices:  History of Abuse:  Ship broker History:   Legal History:  Hobbies/Interests:   Family History:   Family History  Problem Relation Age of Onset  . Cancer Mother     died of breast cancer  . Alcoholism Mother   . Breast cancer Mother   . Hypertension Sister   . Breast cancer Sister   . Hypertension Brother   . Diabetes Brother   . Cancer - Ovarian Sister     Mental Status Examination/Evaluation: Objective:  Appearance: Casual  Eye Contact::  Fair  Speech:  Clear and Coherent  Volume:  Normal  Mood:  angry  Affect:  Non-Congruent  Thought Process:  Coherent  Orientation:  Full (Time, Place, and Person)  Thought Content:  WDL  Suicidal Thoughts:  No  Homicidal Thoughts:  No  Judgement:  Impaired  Insight:  Lacking  Psychomotor Activity:  Increased  Akathisia:  No  Handed:  Right  AIMS (if indicated):    Assets:  Desire for Improvement    Laboratory/X-Ray Psychological Evaluation(s)        Assessment:  Axis I: Conduct Disorder  AXIS I Conduct Disorder  AXIS II Mental retardation, severity unknown  AXIS III Past Medical History  Diagnosis Date  . Hypertension   . Pancreatitis   . Substance abuse     alcohol and tobacco     AXIS IV economic problems and housing problems  AXIS V 51-60 moderate symptoms   Treatment Plan/Recommendations:  Plan of Care: At this time this patient she'll start back on Tegretol 200 mg twice a  day and in 3 weeks we'll get a Tegretol blood level and a comprehensive metabolic panel. The patient return to see me in 2 months from this time. At that time we will discuss more with her family the idea of seeing a therapist  here who could do some family/individual therapy.   Laboratory:  Chemistry Profile  Psychotherapy:   Medications:   Routine PRN Medications:  No  Consultations:   Safety Concerns:    Other:      Haskel Schroeder, MD 7/31/201510:38 AM

## 2014-07-01 LAB — COMPREHENSIVE METABOLIC PANEL
ALK PHOS: 84 U/L (ref 39–117)
ALT: 11 U/L (ref 0–35)
AST: 16 U/L (ref 0–37)
Albumin: 4.3 g/dL (ref 3.5–5.2)
BILIRUBIN TOTAL: 0.2 mg/dL (ref 0.2–1.2)
BUN: 21 mg/dL (ref 6–23)
CO2: 26 mEq/L (ref 19–32)
CREATININE: 0.95 mg/dL (ref 0.50–1.10)
Calcium: 9.5 mg/dL (ref 8.4–10.5)
Chloride: 107 mEq/L (ref 96–112)
Glucose, Bld: 86 mg/dL (ref 70–99)
Potassium: 4 mEq/L (ref 3.5–5.3)
Sodium: 141 mEq/L (ref 135–145)
Total Protein: 6.9 g/dL (ref 6.0–8.3)

## 2014-07-02 LAB — CARBAMAZEPINE LEVEL, TOTAL: CARBAMAZEPINE LVL: 1.3 ug/mL — AB (ref 4.0–12.0)

## 2014-08-15 ENCOUNTER — Ambulatory Visit (INDEPENDENT_AMBULATORY_CARE_PROVIDER_SITE_OTHER): Payer: Medicaid Other | Admitting: Psychiatry

## 2014-08-15 VITALS — BP 143/88 | HR 65 | Wt 112.2 lb

## 2014-08-15 DIAGNOSIS — F3160 Bipolar disorder, current episode mixed, unspecified: Secondary | ICD-10-CM

## 2014-08-15 DIAGNOSIS — F3162 Bipolar disorder, current episode mixed, moderate: Secondary | ICD-10-CM

## 2014-08-15 MED ORDER — CARBAMAZEPINE 200 MG PO TABS: 200.0000 mg | ORAL_TABLET | Freq: Two times a day (BID) | ORAL | Status: DC

## 2014-08-15 NOTE — Progress Notes (Signed)
[facility] MD Progress Note  08/15/2014 12:16 PM Roberta Miller  MRN:  [id number] Subjective:  Cheerful At this time the patient is doing fairly well. She is taking her Tegretol as prescribed and in fact according to her family member who is with her today Roberta Miller and according to reports from the patient's daughter who lives with her the patient is less impulsive. She is less explosive. The patient seems more even keeled. The patient is friendly and engageable. The patient enjoys music and TV. She is sleeping and eating well. She her self acknowledges that she snaps less. Her Tegretol level came back actually very low at 1.3. The patient actually is requesting for an increase in the Tegretol because she says that she is better but not the best. Her family agrees. Overall they can see a difference in her but there is room for improvement. General the patient's mood is good. She shows no evidence of psychosis. She spends a lot of time by her self. Apparently she is getting along better with her daughter Roberta Miller. Diagnosis:   DSM5: Schizophrenia Disorders:   Obsessive-Compulsive Disorders:   Trauma-Stressor Disorders:   Substance/Addictive Disorders:   Depressive Disorders:   Total Time spent with patient:   Axis I: Bipolar, mixed  ADL's:  Intact  Sleep: Good  Appetite:  Good  Suicidal Ideation:  no Homicidal Ideation:  noneAEB (as evidenced by):  Psychiatric Specialty Exam: Physical Exam  ROS  Blood pressure 143/88, pulse 65, weight 112 lb 3.2 oz (50.894 kg).Body mass index is 18.12 kg/(m^2).  General Appearance: Casual  Eye Contact::  Fair  Speech:  Clear and Coherent  Volume:  Normal  Mood:  Euthymic  Affect:  Appropriate  Thought Process:  Coherent  Orientation:  Full (Time, Place, and Person)  Thought Content:  WDL  Suicidal Thoughts:  No  Homicidal Thoughts:  No  Memory:  NA  Judgement:  Good  Insight:  Fair  Psychomotor Activity:  Normal  Concentration:  Good  Recall:  Good   Fund of Knowledge:Fair  Language: Poor  Akathisia:  NA  Handed:  Right  AIMS (if indicated):     Assets:  Desire for Improvement  Sleep:      Musculoskeletal: Strength & Muscle Tone:  Gait & Station:  Patient leans:   Current Medications: Current Outpatient Prescriptions  Medication Sig Dispense Refill  . Blood Pressure Monitor KIT To check blood pressure daily  1 each  0  . carbamazepine (TEGRETOL) 200 MG tablet Take 1 tablet (200 mg total) by mouth 2 (two) times daily. 2 bid  120 tablet  5  . hydrochlorothiazide (HYDRODIURIL) 25 MG tablet Take 1 tablet (25 mg total) by mouth daily.  90 tablet  3  . metroNIDAZOLE (FLAGYL) 500 MG tablet Take two tablets by mouth twice a day, for one day.  Or you can take all four tablets at once if you can tolerate it.  4 tablet  0  . pantoprazole (PROTONIX) 40 MG tablet Take 1 tablet (40 mg total) by mouth daily at 12 noon.  30 tablet  12   No current facility-administered medications for this visit.    Lab Results: No results found for this or any previous visit (from the past 48 hour(s)).  Physical Findings: AIMS:  , ,  ,  ,    CIWA:    COWS:     Treatment Plan Summary: At this time we'll go ahead and increase this patient's Tegretol to taking 200 mg taking 2   in the morning and 2 at night. 3 weeks later the patient will get a Tegretol blood level to return to see me in 2-3 months. The family was told to call is that there is any problems. At this time the patient seems improved by taking Tegretol which controlled her impulsivity and irritability. This is clearly due to organic mental disorder.  Plan:  Medical Decision Making Problem Points:  Established problem, stable/improving (1) Data Points:  Review of medication regiment & side effects (2)  I certify that inpatient services furnished can reasonably be expected to improve the patient's condition.   Keyvin Rison, [place] 08/15/2014, 12:16 PM

## 2014-09-08 ENCOUNTER — Encounter: Payer: Self-pay | Admitting: Obstetrics & Gynecology

## 2014-09-22 ENCOUNTER — Encounter: Payer: Self-pay | Admitting: Pulmonary Disease

## 2014-11-19 ENCOUNTER — Ambulatory Visit (INDEPENDENT_AMBULATORY_CARE_PROVIDER_SITE_OTHER): Payer: Medicaid Other | Admitting: Psychiatry

## 2014-11-19 ENCOUNTER — Other Ambulatory Visit: Payer: Self-pay | Admitting: Internal Medicine

## 2014-11-19 VITALS — BP 153/107 | HR 75 | Ht 63.5 in | Wt 108.6 lb

## 2014-11-19 DIAGNOSIS — F3160 Bipolar disorder, current episode mixed, unspecified: Secondary | ICD-10-CM

## 2014-11-19 DIAGNOSIS — F3162 Bipolar disorder, current episode mixed, moderate: Secondary | ICD-10-CM

## 2014-11-19 MED ORDER — CARBAMAZEPINE 200 MG PO TABS: 200.0000 mg | ORAL_TABLET | Freq: Two times a day (BID) | ORAL | Status: DC

## 2014-11-19 NOTE — Progress Notes (Signed)
[facility] MD Progress Note  11/19/2014 2:39 PM Roberta Miller  MRN:  [id number] Subjective:  Doing well Today the patient is seen with [name] who is her niece who cares for her. The patient recently moved from her daughter's home into her own home for the last one month. For the last month she's been doing very well. She doesn't feel lonely. When she was living with her daughter she typically would be irritable and angry but she says today she feels so much better. Her anger is nearly completely gone. She enjoys being home alone she takes care of all the things she needs to care for. She watches TV. She's not explosive. According to her family members with her today the patient is significantly improved. They apologize for not getting her Tegretol level which she'll do in the next week. The patient has no signs of Tegretol problems. She is walking well has no double vision and is eating well. She sleeps well and has good energy. Tegretol seems to be having a calming effect and she acknowledges this. If anything she says she feels a little bit sleepy. Overall though the patient is doing much better. The family and the patient both agree with this. Diagnosis:   DSM5:Schizophrenia Disorders:   Obsessive-Compulsive Disorders:   Trauma-Stressor Disorders:   Substance/Addictive Disorders:   Depressive Disorders:   Total Time spent with patient:   Axis I: Bipolar, mixed  ADL's:  Intact  Sleep: Good  Appetite:  Good  Suicidal Ideation:  no Homicidal Ideation:  none AEB (as evidenced by):  Psychiatric Specialty Exam: Physical Exam  ROS  Blood pressure 153/107, pulse 75, height 5' 3.5" (1.613 m), weight 108 lb 9.6 oz (49.261 kg).Body mass index is 18.93 kg/(m^2).  General Appearance: Casual  Eye Contact::  Good  Speech:  Clear and Coherent  Volume:  Normal  Mood:  Dysphoric  Affect:  Congruent  Thought Process:  Coherent  Orientation:  Full (Time, Place, and Person)  Thought Content:  WDL   Suicidal Thoughts:  No  Homicidal Thoughts:  No  Memory:  NA  Judgement:  Fair  Insight:  Fair  Psychomotor Activity:  Normal  Concentration:  Good  Recall:  [place] of Knowledge:Good  Language: Good  Akathisia:  No  Handed:  Right  AIMS (if indicated):     Assets:  Desire for Improvement  Sleep:      Musculoskeletal: Strength & Muscle Tone:  Gait & Station:  Patient leans:   Current Medications: Current Outpatient Prescriptions  Medication Sig Dispense Refill  . Blood Pressure Monitor KIT To check blood pressure daily 1 each 0  . carbamazepine (TEGRETOL) 200 MG tablet Take 1 tablet (200 mg total) by mouth 2 (two) times daily. 2 bid 120 tablet 5  . hydrochlorothiazide (HYDRODIURIL) 25 MG tablet Take 1 tablet (25 mg total) by mouth daily. 90 tablet 3  . metroNIDAZOLE (FLAGYL) 500 MG tablet Take two tablets by mouth twice a day, for one day.  Or you can take all four tablets at once if you can tolerate it. 4 tablet 0  . pantoprazole (PROTONIX) 40 MG tablet Take 1 tablet (40 mg total) by mouth daily at 12 noon. 30 tablet 12   No current facility-administered medications for this visit.    Lab Results: No results found for this or any previous visit (from the past 48 hour(s)).  Physical Findings: AIMS:  , ,  ,  ,    CIWA:    COWS:  Treatment Plan Summary: At this time the patient will continue taking Tegretol 200 mg and will take 2 in the morning and 2 at night. The next week the patient had a Tegretol blood level. Patient is doing very well. He has no significant physical problems at this time. This patient will be seen again in 3 months. She is medically very stable. She denies use of alcohol or drugs.  Plan:  Medical Decision Making Problem Points:  Established problem, stable/improving (1) Data Points:  Review of medication regiment & side effects (2)  I certify that inpatient services furnished can reasonably be expected to improve the patient's condition.    Haskel Schroeder 11/19/2014, 2:39 PM

## 2014-11-28 ENCOUNTER — Ambulatory Visit: Payer: Self-pay | Admitting: Internal Medicine

## 2015-02-24 ENCOUNTER — Other Ambulatory Visit (HOSPITAL_COMMUNITY): Payer: Self-pay | Admitting: Psychiatry

## 2015-02-24 LAB — COMPREHENSIVE METABOLIC PANEL
ALT: 10 U/L (ref 0–35)
AST: 16 U/L (ref 0–37)
Albumin: 4.1 g/dL (ref 3.5–5.2)
Alkaline Phosphatase: 81 U/L (ref 39–117)
BILIRUBIN TOTAL: 0.2 mg/dL (ref 0.2–1.2)
BUN: 15 mg/dL (ref 6–23)
CALCIUM: 9.1 mg/dL (ref 8.4–10.5)
CO2: 26 mEq/L (ref 19–32)
CREATININE: 0.96 mg/dL (ref 0.50–1.10)
Chloride: 105 mEq/L (ref 96–112)
Glucose, Bld: 75 mg/dL (ref 70–99)
Potassium: 4 mEq/L (ref 3.5–5.3)
Sodium: 140 mEq/L (ref 135–145)
Total Protein: 6.7 g/dL (ref 6.0–8.3)

## 2015-02-25 ENCOUNTER — Ambulatory Visit (HOSPITAL_COMMUNITY): Payer: Self-pay | Admitting: Psychiatry

## 2015-02-25 LAB — CARBAMAZEPINE LEVEL, TOTAL: CARBAMAZEPINE LVL: 6.4 ug/mL (ref 4.0–12.0)

## 2015-03-25 ENCOUNTER — Encounter: Payer: Self-pay | Admitting: Psychiatry

## 2015-03-31 ENCOUNTER — Other Ambulatory Visit: Payer: Self-pay

## 2015-03-31 DIAGNOSIS — Z1231 Encounter for screening mammogram for malignant neoplasm of breast: Secondary | ICD-10-CM

## 2015-04-02 ENCOUNTER — Encounter (HOSPITAL_COMMUNITY): Payer: Self-pay | Admitting: Psychiatry

## 2015-04-02 ENCOUNTER — Ambulatory Visit (INDEPENDENT_AMBULATORY_CARE_PROVIDER_SITE_OTHER): Payer: Medicaid Other | Admitting: Psychiatry

## 2015-04-02 DIAGNOSIS — F4324 Adjustment disorder with disturbance of conduct: Secondary | ICD-10-CM

## 2015-04-02 DIAGNOSIS — F4323 Adjustment disorder with mixed anxiety and depressed mood: Secondary | ICD-10-CM

## 2015-04-02 MED ORDER — CARBAMAZEPINE 200 MG PO TABS: 200.0000 mg | ORAL_TABLET | Freq: Two times a day (BID) | ORAL | Status: DC

## 2015-04-02 NOTE — Progress Notes (Signed)
[facility] MD Progress Note  04/02/2015 4:28 PM Roberta Miller  MRN:  [id number] Subjective: Doing great Today the patient is seen with her niece [name] again. Once again the patient seems to be doing great. She's not explosive she's not irritable she's doing well living alone and she likes it. She is not lonely. She takes Tegretol 200 mg 2 twice a day and produced a blood level a Tegretol blood level of 6.4. Her liver enzymes are fine. The patient is actually doing very well. She denies being depressed. She is not anxious. She sleeping and eating well. She's got good energy. He is able to enjoy the television stable to take walks and has good relationships with her family. She has one or 2 friends one assumes a female friend. The patient drinks alcohol very rarely perhaps once a week will have 2 or 3 beers. The patient smokes a half a packet cigarettes a day. The patient shows no evidence of psychosis. She [facility] a [facility] basis. Her family is very involved with her. Once again the patient and the family are pleased the Tegretol seems to be reducing her impulsivity and irritability. Is doing this without over sedating her. Emotionally she is very stable. Principal Problem: Adjustment disorder with disturbance of conduct Diagnosis:   Patient Active Problem List   Diagnosis Date Noted  . Abnormality of gait [R26.9] 02/13/2013  . Right ovarian cyst--needs follow up u/s [N83.20] 01/14/2013  . Fracture of left knee region [S82.892A] 01/14/2013  . Polysubstance abuse: alcohol and tobacco and cocaine [F19.10] 01/09/2013  . Left upper arm pain [M79.622] 01/09/2013  . 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 Pancreatitis [K85.9] 11/09/2011  . Nephrolithiasis [N20.0] 11/09/2011  . HTN (hypertension) [I10] 11/09/2011  . UNSPECIFIED SYPHILIS [A53.9] 02/14/2010  . Microscopic hematuria [R31.2] 12/24/2009  . FETAL ALCOHOL SYNDROME [Q86.0]  12/24/2009   Total Time spent with patient: 30 minutes   Past Medical History:  Past Medical History  Diagnosis Date  . Hypertension   . Pancreatitis   . Substance abuse     alcohol and tobacco    Past Surgical History  Procedure Laterality Date  . No past surgeries     Family History:  Family History  Problem Relation Age of Onset  . Cancer Mother     died of breast cancer  . Alcoholism Mother   . Breast cancer Mother   . Hypertension Sister   . Breast cancer Sister   . Hypertension Brother   . Diabetes Brother   . Cancer - Ovarian Sister    Social History:  History  Alcohol Use  . 0.5 oz/week  . 1 drink(s) per week    Comment: Drinks beer. 40oz every other day     History  Drug Use No    Comment: hx of cocaine use    History   Social History  . Marital Status: Single    Spouse Name: N/A  . Number of Children: N/A  . Years of Education: N/A   Social History Main Topics  . Smoking status: Current Every Day Smoker -- 0.50 packs/day    Types: Cigarettes  . Smokeless tobacco: Never Used     Comment: 1/2ppd  . Alcohol Use: 0.5 oz/week    1 drink(s) per week     Comment: Drinks beer. 40oz every other day  . Drug Use: No     Comment: hx of cocaine use  .  Sexual Activity: No   Other Topics Concern  . Not on file   Social History Narrative   Lives alone   Niece power of attorney   Gets SSI   Single         Additional History:    Sleep: Good  Appetite:  Good   Assessment:   Musculoskeletal: Strength & Muscle Tone:  Gait & Station:  Patient leans:    Psychiatric Specialty Exam: Physical Exam  ROS  There were no vitals taken for this visit.There is no weight on file to calculate BMI.  General Appearance: Casual  Eye Contact::  Good  Speech:  Clear and Coherent  Volume:  Normal  Mood:  NA  Affect:  Appropriate  Thought Process:  Coherent  Orientation:  Full (Time, Place, and Person)  Thought Content:  WDL  Suicidal Thoughts:  No   Homicidal Thoughts:  No  Memory:  NA  Judgement:  Good  Insight:  Good  Psychomotor Activity:  Normal  Concentration:  Good  Recall:  Good  Fund of Knowledge:Good  Language: Good  Akathisia:  No  Handed:  Right  AIMS (if indicated):     Assets:  Communication Skills  ADL's:  Intact  Cognition: WNL  Sleep:        Current Medications: Current Outpatient Prescriptions  Medication Sig Dispense Refill  . Blood Pressure Monitor KIT To check blood pressure daily 1 each 0  . carbamazepine (TEGRETOL) 200 MG tablet Take 1 tablet (200 mg total) by mouth 2 (two) times daily. 2 bid 120 tablet 5  . hydrochlorothiazide (HYDRODIURIL) 25 MG tablet TAKE 1 TABLET (25 MG TOTAL) BY MOUTH DAILY. 90 tablet 3  . metroNIDAZOLE (FLAGYL) 500 MG tablet Take two tablets by mouth twice a day, for one day.  Or you can take all four tablets at once if you can tolerate it. 4 tablet 0  . pantoprazole (PROTONIX) 40 MG tablet Take 1 tablet (40 mg total) by mouth daily at 12 noon. 30 tablet 12   No current facility-administered medications for this visit.    Lab Results: No results found for this or any previous visit (from the past 48 hour(s)).  Physical Findings: AIMS:  , ,  ,  ,    CIWA:    COWS:     Treatment Plan Summary: Patient is doing very well. She'll continue taking Tegretol 200 mg 2 in the morning and 2 at night. The patient takes her blood pressure medicine on a regular basis as well. The patient does all her basic ADLs without a problem. She seems to be functioning well. This is the case of an individual who looks to be would be difficult for her to live alone but in fact now that she's no longer living with her conflictual daughter who supposedly her caregiver she is doing much better. In essence this patient does better with herself taking care of herself with family looking in on her and taking Tegretol for your debility and impulsivity. This patient she'll return to see me in 4  months.   Medical Decision Making:  Self-Limited or Minor (1)     Rubi Tooley [place] 04/02/2015, 4:28 PM

## 2015-04-07 ENCOUNTER — Ambulatory Visit
Admission: RE | Admit: 2015-04-07 | Discharge: 2015-04-07 | Disposition: A | Discharge status: Home / Self Care | Payer: Medicaid Other | Source: Ambulatory Visit

## 2015-04-07 DIAGNOSIS — Z1231 Encounter for screening mammogram for malignant neoplasm of breast: Secondary | ICD-10-CM

## 2015-04-09 ENCOUNTER — Encounter: Payer: Self-pay | Admitting: Pulmonary Disease

## 2015-04-09 DIAGNOSIS — Z0000 Encounter for general adult medical examination without abnormal findings: Secondary | ICD-10-CM | POA: Insufficient documentation

## 2015-06-24 ENCOUNTER — Other Ambulatory Visit: Payer: Self-pay | Admitting: Pulmonary Disease

## 2015-07-27 ENCOUNTER — Ambulatory Visit (INDEPENDENT_AMBULATORY_CARE_PROVIDER_SITE_OTHER): Payer: Medicaid Other | Admitting: Internal Medicine

## 2015-07-27 ENCOUNTER — Encounter: Payer: Self-pay | Admitting: Internal Medicine

## 2015-07-27 VITALS — BP 144/94 | HR 71 | Temp 97.6°F | Ht 64.0 in | Wt 107.8 lb

## 2015-07-27 DIAGNOSIS — N8320 Unspecified ovarian cysts: Secondary | ICD-10-CM | POA: Diagnosis not present

## 2015-07-27 DIAGNOSIS — I10 Essential (primary) hypertension: Secondary | ICD-10-CM | POA: Diagnosis not present

## 2015-07-27 DIAGNOSIS — Z8041 Family history of malignant neoplasm of ovary: Secondary | ICD-10-CM | POA: Diagnosis not present

## 2015-07-27 DIAGNOSIS — Z803 Family history of malignant neoplasm of breast: Secondary | ICD-10-CM

## 2015-07-27 DIAGNOSIS — R21 Rash and other nonspecific skin eruption: Secondary | ICD-10-CM | POA: Insufficient documentation

## 2015-07-27 DIAGNOSIS — Z8742 Personal history of other diseases of the female genital tract: Secondary | ICD-10-CM | POA: Diagnosis not present

## 2015-07-27 DIAGNOSIS — N83201 Unspecified ovarian cyst, right side: Secondary | ICD-10-CM

## 2015-07-27 DIAGNOSIS — L988 Other specified disorders of the skin and subcutaneous tissue: Secondary | ICD-10-CM | POA: Diagnosis not present

## 2015-07-27 DIAGNOSIS — L989 Disorder of the skin and subcutaneous tissue, unspecified: Secondary | ICD-10-CM | POA: Diagnosis not present

## 2015-07-27 MED ORDER — HYDROCORTISONE 1 % EX CREA: TOPICAL_CREAM | CUTANEOUS | Status: AC

## 2015-07-27 NOTE — Progress Notes (Signed)
Patient ID: Roberta Miller, female   DOB: 08/22/1956, 59 y.o.   MRN: [id number]   Subjective:   Patient ID: Roberta Miller female   DOB: [birth year] 59 y.o.   MRN: [id number]  HPI: Roberta Miller is a 59 y.o. with PMH listed below, presented for follow up visit for her chronic medical conditions, including An ovarian cyst and a skin rash that has been present for about a year. Please see assessment and plan for status on pts chronic medical conditions.  Past Medical History  Diagnosis Date  . Hypertension   . Pancreatitis   . Substance abuse     alcohol and tobacco   Current Outpatient Prescriptions  Medication Sig Dispense Refill  . Blood Pressure Monitor KIT To check blood pressure daily 1 each 0  . carbamazepine (TEGRETOL) 200 MG tablet Take 1 tablet (200 mg total) by mouth 2 (two) times daily. 2 bid 120 tablet 5  . hydrochlorothiazide (HYDRODIURIL) 25 MG tablet TAKE 1 TABLET (25 MG TOTAL) BY MOUTH DAILY. 90 tablet 2  . metroNIDAZOLE (FLAGYL) 500 MG tablet Take two tablets by mouth twice a day, for one day.  Or you can take all four tablets at once if you can tolerate it. 4 tablet 0  . pantoprazole (PROTONIX) 40 MG tablet Take 1 tablet (40 mg total) by mouth daily at 12 noon. 30 tablet 12   No current facility-administered medications for this visit.   Family History  Problem Relation Age of Onset  . Cancer Mother     died of breast cancer  . Alcoholism Mother   . Breast cancer Mother   . Hypertension Sister   . Breast cancer Sister   . Hypertension Brother   . Diabetes Brother   . Cancer - Ovarian Sister    Social History   Social History  . Marital Status: Single    Spouse Name: N/A  . Number of Children: N/A  . Years of Education: N/A   Social History Main Topics  . Smoking status: Current Every Day Smoker -- 0.50 packs/day    Types: Cigarettes  . Smokeless tobacco: Never Used     Comment: 1/2ppd   . Alcohol Use: 0.6 oz/week    1 Standard drinks or equivalent per  week     Comment: Drinks beer. 40oz every other day  . Drug Use: No     Comment: hx of cocaine use  . Sexual Activity: No   Other Topics Concern  . None   Social History Narrative   Lives alone   Niece power of attorney   Gets SSI   Single         Review of Systems: CONSTITUTIONAL- No Fever, weightloss, night sweat or change in appetite. SKIN- Has a chronic skin rash with itching. HEAD- No Headache or dizziness. EYES- No Vision loss changes,  RESPIRATORY- No Cough or SOB. CARDIAC- No Palpitations, or chest pain. GI- No vomiting, diarrhoea, abd pain. URINARY- No Frequency, or dysuria. NEUROLOGIC- No Numbness, syncope, seizures or burning. PSych-Not depression  Objective:  Physical Exam: Filed Vitals:   07/27/15 1559  BP: 144/94  Pulse: 71  Temp: 97.6 F (36.4 C)  TempSrc: Oral  Height: 5' 4"  (1.626 m)  Weight: 107 lb 12.8 oz (48.898 kg)  SpO2: 100%   GENERAL- alert, co-operative, appears as stated age, not in any distress. HEENT- Atraumatic, normocephalic, PERRL,  neck supple, poor dentition. CARDIAC- RRR, no murmurs, rubs or gallops. RESP- Moving equal volumes of air,  no  wheezes or crackles. ABDOMEN- Soft, nontender, bowel sounds present. NEURO- Alert and oriented, No obvious Cr N abnormality,  Gait- Normal. EXTREMITIES- Warm and well perfused, no pedal edema. SKIN- Warm, dry, No rash or lesion. PSYCH- Normal mood and affect, appropriate thought content and speech.  Assessment & Plan:   The patient's case and plan of care was discussed with attending physician, Dr. Lynnae January.  Please see problem based charting for assessment and plan.

## 2015-07-27 NOTE — Assessment & Plan Note (Signed)
BP Readings from Last 3 Encounters:  07/27/15 144/94  11/19/14 153/107  08/15/14 143/88    Lab Results  Component Value Date   NA 140 02/24/2015   K 4.0 02/24/2015   CREATININE 0.96 02/24/2015    Assessment: Blood pressure control:  Controlled Comments: On HCTZ 25mg  daily  Plan: Medications:  continue current medications Educational resources provided:   Self management tools provided:   Other plans: CMEt today

## 2015-07-27 NOTE — Patient Instructions (Signed)
We will be sending you to a skin doctor for the lesion on your hand.  Also we will be getting an ultrasound of your ovaries to better see what the ovarian cysts is.  We will see you in 3 months to follow up on you.

## 2015-07-27 NOTE — Assessment & Plan Note (Addendum)
Follow up Pelvic US in 2014-- ovaries were not visualized. Family hx of sister who had ovarian cancer and mother who died of breat cancer.  Plan- Transvaginal ultrasound.   Addendum-08/14/2015  Transabdominal Ultrasound done which was unremarkbale, instead of transvaginal ultrasound, called pt got voice mail, left message about normal result, but encouraged pt to still get transvaginal US to be sure.

## 2015-07-27 NOTE — Assessment & Plan Note (Signed)
Pt has a hyperpigmented slightly raised plaque-like lesion on her right forearm, measuring ~3 by ~6cm. Niece who is present says it started  As an insect bite, 2 years ago, and has been persistent since then. Rash is itchy. She has been applying lip balm to lesion. Differentials- Lichen planus, melanoma, morphea.  Plan- Hydrocortisone cream. - Referral to dermatology.

## 2015-07-28 LAB — CMP14 + ANION GAP
ALK PHOS: 96 IU/L (ref 39–117)
ALT: 10 IU/L (ref 0–32)
ANION GAP: 18 mmol/L (ref 10.0–18.0)
AST: 15 IU/L (ref 0–40)
Albumin/Globulin Ratio: 1.7 (ref 1.1–2.5)
Albumin: 4.5 g/dL (ref 3.5–5.5)
BILIRUBIN TOTAL: 0.3 mg/dL (ref 0.0–1.2)
BUN/Creatinine Ratio: 18 (ref 9–23)
BUN: 17 mg/dL (ref 6–24)
CHLORIDE: 101 mmol/L (ref 97–108)
CO2: 24 mmol/L (ref 18–29)
CREATININE: 0.94 mg/dL (ref 0.57–1.00)
Calcium: 10.1 mg/dL (ref 8.7–10.2)
GFR calc Af Amer: 78 mL/min/{1.73_m2} (ref >59)
GFR calc non Af Amer: 68 mL/min/{1.73_m2} (ref >59)
GLOBULIN, TOTAL: 2.6 g/dL (ref 1.5–4.5)
Glucose: 93 mg/dL (ref 65–99)
POTASSIUM: 4.1 mmol/L (ref 3.5–5.2)
SODIUM: 143 mmol/L (ref 134–144)
Total Protein: 7.1 g/dL (ref 6.0–8.5)

## 2015-07-28 NOTE — Progress Notes (Signed)
Internal Medicine Clinic Attending  Case discussed with Dr. Emokpae soon after the resident saw the patient.  We reviewed the resident's history and exam and pertinent patient test results.  I agree with the assessment, diagnosis, and plan of care documented in the resident's note. 

## 2015-07-30 ENCOUNTER — Ambulatory Visit (HOSPITAL_COMMUNITY): Payer: Self-pay | Admitting: Psychiatry

## 2015-08-07 ENCOUNTER — Ambulatory Visit (INDEPENDENT_AMBULATORY_CARE_PROVIDER_SITE_OTHER): Payer: Medicaid Other | Admitting: Psychiatry

## 2015-08-07 VITALS — BP 140/98 | HR 75 | Ht 64.0 in | Wt 107.0 lb

## 2015-08-07 DIAGNOSIS — F3110 Bipolar disorder, current episode manic without psychotic features, unspecified: Secondary | ICD-10-CM

## 2015-08-07 MED ORDER — CARBAMAZEPINE 200 MG PO TABS: 200.0000 mg | ORAL_TABLET | Freq: Two times a day (BID) | ORAL | Status: DC

## 2015-08-07 NOTE — Progress Notes (Signed)
[facility] MD Progress Note  08/07/2015 10:25 AM Roberta Miller  MRN:  [id number] Subjective: Happy Principal Problem: Bipolar disorder   Today the patient was seen with Roberta Miller her caregiver. Patient actually lives alone. She is doing very well. Her mood is stable. She is much less explosive. Unfortunately she actually came 25 minutes late for a 30 minute visit. Nonetheless according to her and her caregiver who are seen together she's actually doing very well. She's not depressed she's not anxious. She sleeping and eating well. She does not drink alcohol or use drugs. Medically she is quite stable. She is talkative she's interactive patient she is calm she had weight 40 minutes to be able to be seen and yet she did a good job. She seems friendly and engageable. According to Roberta Miller her caregiver who is actually a family member who just drops and on her she is doing well. She's getting along well with her family. She's not suicidal and she demonstrates no evidence of psychosis. Patient Active Problem List   Diagnosis Date Noted  . Rash [R21] 07/27/2015  . Routine health maintenance [Z00.00] 04/09/2015  . Abnormality of gait [R26.9] 02/13/2013  . Right ovarian cyst--needs follow up u/s [N83.20] 01/14/2013  . Fracture of left knee region [S82.892A] 01/14/2013  . Polysubstance abuse: alcohol and tobacco and cocaine [F19.10] 01/09/2013  . Left upper arm pain [M79.622] 01/09/2013  . 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 Pancreatitis [K85.9] 11/09/2011  . Nephrolithiasis [N20.0] 11/09/2011  . HTN (hypertension) [I10] 11/09/2011  . UNSPECIFIED SYPHILIS [A53.9] 02/14/2010  . Microscopic hematuria [R31.2] 12/24/2009  . FETAL ALCOHOL SYNDROME [Q86.0] 12/24/2009   Total Time spent with patient: 30 minutes  Past Psychiatric History:  Past Medical History:  Past Medical History  Diagnosis Date  . Hypertension   . Pancreatitis   . Substance abuse     alcohol and tobacco     Past Surgical History  Procedure Laterality Date  . No past surgeries     Family History:  Family History  Problem Relation Age of Onset  . Cancer Mother     died of breast cancer  . Alcoholism Mother   . Breast cancer Mother   . Hypertension Sister   . Breast cancer Sister   . Hypertension Brother   . Diabetes Brother   . Cancer - Ovarian Sister    Family Psychiatric  History:   Social History:  History  Alcohol Use  . 0.6 oz/week  . 1 Standard drinks or equivalent per week    Comment: Drinks beer. 40oz every other day     History  Drug Use No    Comment: hx of cocaine use    Social History   Social History  . Marital Status: Single    Spouse Name: N/A  . Number of Children: N/A  . Years of Education: N/A   Social History Main Topics  . Smoking status: Current Every Day Smoker -- 0.50 packs/day    Types: Cigarettes  . Smokeless tobacco: Never Used     Comment: 1/2ppd   . Alcohol Use: 0.6 oz/week    1 Standard drinks or equivalent per week     Comment: Drinks beer. 40oz every other day  . Drug Use: No     Comment: hx of cocaine use  . Sexual Activity: No   Other Topics Concern  . Not on file   Social History Narrative   Lives alone   Niece power of attorney  Gets SSI   Single         Additional Social History:                         Sleep: Good  Appetite:  Good  Current Medications: Current Outpatient Prescriptions  Medication Sig Dispense Refill  . Blood Pressure Monitor KIT To check blood pressure daily 1 each 0  . carbamazepine (TEGRETOL) 200 MG tablet Take 1 tablet (200 mg total) by mouth 2 (two) times daily. 2 bid 120 tablet 5  . hydrochlorothiazide (HYDRODIURIL) 25 MG tablet TAKE 1 TABLET (25 MG TOTAL) BY MOUTH DAILY. 90 tablet 2  . hydrocortisone cream 1 % Apply to affected area 2 times daily- Right forearm. 30 g 1  . metroNIDAZOLE (FLAGYL) 500 MG tablet Take two tablets by mouth twice a day, for one day.  Or you can take  all four tablets at once if you can tolerate it. 4 tablet 0  . pantoprazole (PROTONIX) 40 MG tablet Take 1 tablet (40 mg total) by mouth daily at 12 noon. 30 tablet 12   No current facility-administered medications for this visit.    Lab Results: No results found for this or any previous visit (from the past 48 hour(s)).  Physical Findings: AIMS:  , ,  ,  ,    CIWA:    COWS:     Musculoskeletal: Strength & Muscle Tone: within normal limits Gait & Station: normal Patient leans: Right  Psychiatric Specialty Exam: ROS  Blood pressure 140/98, pulse 75, height _0  (1.626 m), weight 107 lb (48.535 kg).Body mass index is 18.36 kg/(m^2).  General Appearance: Casual  Eye Contact::  Fair  Speech:  Clear and Coherent  Volume:  Normal  Mood:  Euthymic  Affect:  Appropriate  Thought Process:  Coherent  Orientation:  NA  Thought Content:  WDL  Suicidal Thoughts:  No  Homicidal Thoughts:  No  Memory:  NA  Judgement:  Good  Insight:  Good  Psychomotor Activity:  Normal  Concentration:  Good  Recall:  [place] of Knowledge:Good  Language: Good  Akathisia:  No  Handed:  Right  AIMS (if indicated):     Assets:  Communication Skills  ADL's:  Intact  Cognition: WNL  Sleep:      Treatment Plan Summary: At this time the patient continue taking Tegretol 200 mg 2 in the morning and 2 at night. The patient denies any physical complaints at this time. This patient to return to see me for a full evaluation in 3 months.  PLOVSKY, [place] 08/07/2015, 10:25 AM

## 2015-08-10 ENCOUNTER — Telehealth (HOSPITAL_COMMUNITY): Payer: Self-pay

## 2015-08-10 NOTE — Telephone Encounter (Signed)
Medication management - Telephone call with patient's [facility] pharmacy after message received from them to verify patient's correct dosage of Tegretol.  Informed per record review patient was to continue 200mg  twice a day per Dr.Plovsky 08/07/15.  Verified [facility] for [facility] with [facility].

## 2015-08-12 ENCOUNTER — Other Ambulatory Visit: Payer: Self-pay | Admitting: Internal Medicine

## 2015-08-12 ENCOUNTER — Ambulatory Visit (HOSPITAL_COMMUNITY)
Admission: RE | Admit: 2015-08-12 | Discharge: 2015-08-12 | Disposition: A | Discharge status: Home / Self Care | Payer: Medicaid Other | Source: Ambulatory Visit | Attending: Internal Medicine | Admitting: Internal Medicine

## 2015-08-12 DIAGNOSIS — N83201 Unspecified ovarian cyst, right side: Secondary | ICD-10-CM

## 2015-09-07 ENCOUNTER — Telehealth: Payer: Self-pay | Admitting: *Deleted

## 2015-09-07 NOTE — Telephone Encounter (Signed)
REMINDER CALL GIVEN.SPOKE WITH NIECE WHO TAKES HER TO ALL HER APPOINTMENTS.

## 2015-11-06 ENCOUNTER — Ambulatory Visit (HOSPITAL_COMMUNITY): Payer: Self-pay | Admitting: Psychiatry

## 2015-11-25 ENCOUNTER — Ambulatory Visit (INDEPENDENT_AMBULATORY_CARE_PROVIDER_SITE_OTHER): Payer: Medicaid Other | Admitting: Psychiatry

## 2015-11-25 VITALS — BP 104/70 | HR 85 | Ht 64.25 in | Wt 112.8 lb

## 2015-11-25 DIAGNOSIS — F3110 Bipolar disorder, current episode manic without psychotic features, unspecified: Secondary | ICD-10-CM

## 2015-11-25 MED ORDER — CARBAMAZEPINE 200 MG PO TABS: 200.0000 mg | ORAL_TABLET | Freq: Two times a day (BID) | ORAL | Status: DC

## 2015-11-25 NOTE — Progress Notes (Signed)
[facility] MD Progress Note  11/25/2015 3:25 PM Shacoria Latif  MRN:  [id number] Subjective:  Happy Principal Problem: Bipolar disorder, most recent episode mania Diagnosis:  Bipolar disorder most recent episode mania Today the patient is seen with an elderly family member. She's also seen with her granddaughter is 58 years old. Patient is doing very well. She 1 isolated episode where she snapped but resolved quickly. Patient has not been violent. The patient rarely has periods of agitation. For the most part she tolerates the world fairly well. She sleeping and eating well. She uses no alcohol uses no drugs. She stays active. The patient on the other hand likes living alone. People easily get on her and she's got the insight to understand. Interestingly the patient does not see a therapist. Generally she takes her medicine routinely. Only rarely does she misses it. The patient has no nausea or vomiting or any GI complaints. She has no psychotic symptoms. Her anxiety is well-controlled. Most importantly her irritability is minimized with the use of Tegretol. She is not oversedated and has not had any falls. She is no neurological complaints. Patient Active Problem List   Diagnosis Date Noted  . Rash [R21] 07/27/2015  . Routine health maintenance [Z00.00] 04/09/2015  . Abnormality of gait [R26.9] 02/13/2013  . Right ovarian cyst--needs follow up u/s [N83.201] 01/14/2013  . Fracture of left knee region [S82.892A] 01/14/2013  . Polysubstance abuse: alcohol and tobacco and cocaine [F19.10] 01/09/2013  . Left upper arm pain [M79.622] 01/09/2013  . 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 Pancreatitis [K85.9] 11/09/2011  . Nephrolithiasis [N20.0] 11/09/2011  . HTN (hypertension) [I10] 11/09/2011  . UNSPECIFIED SYPHILIS [A53.9] 02/14/2010  . Microscopic hematuria [R31.29] 12/24/2009  . FETAL ALCOHOL SYNDROME [IMO0002] 12/24/2009   Total Time spent with patient: 30 minutes  Past  Psychiatric History:   Past Medical History:  Past Medical History  Diagnosis Date  . Hypertension   . Pancreatitis   . Substance abuse     alcohol and tobacco    Past Surgical History  Procedure Laterality Date  . No past surgeries     Family History:  Family History  Problem Relation Age of Onset  . Cancer Mother     died of breast cancer  . Alcoholism Mother   . Breast cancer Mother   . Hypertension Sister   . Breast cancer Sister   . Hypertension Brother   . Diabetes Brother   . Cancer - Ovarian Sister    Family Psychiatric  History:  Social History:  History  Alcohol Use  . 0.6 oz/week  . 1 Standard drinks or equivalent per week    Comment: Drinks beer. 40oz every other day     History  Drug Use No    Comment: hx of cocaine use    Social History   Social History  . Marital Status: Single    Spouse Name: N/A  . Number of Children: N/A  . Years of Education: N/A   Social History Main Topics  . Smoking status: Current Every Day Smoker -- 0.50 packs/day    Types: Cigarettes  . Smokeless tobacco: Never Used     Comment: 1/2ppd   . Alcohol Use: 0.6 oz/week    1 Standard drinks or equivalent per week     Comment: Drinks beer. 40oz every other day  . Drug Use: No     Comment: hx of cocaine use  . Sexual Activity: No   Other Topics  Concern  . Not on file   Social History Narrative   Lives alone   Niece power of attorney   Gets SSI   Single         Additional Social History:                         Sleep: Good  Appetite:  Good  Current Medications: Current Outpatient Prescriptions  Medication Sig Dispense Refill  . Blood Pressure Monitor KIT To check blood pressure daily 1 each 0  . carbamazepine (TEGRETOL) 200 MG tablet Take 1 tablet (200 mg total) by mouth 2 (two) times daily. 2 bid 120 tablet 5  . hydrochlorothiazide (HYDRODIURIL) 25 MG tablet TAKE 1 TABLET (25 MG TOTAL) BY MOUTH DAILY. 90 tablet 2  . hydrocortisone cream 1 %  Apply to affected area 2 times daily- Right forearm. 30 g 1  . metroNIDAZOLE (FLAGYL) 500 MG tablet Take two tablets by mouth twice a day, for one day.  Or you can take all four tablets at once if you can tolerate it. 4 tablet 0  . pantoprazole (PROTONIX) 40 MG tablet Take 1 tablet (40 mg total) by mouth daily at 12 noon. 30 tablet 12   No current facility-administered medications for this visit.    Lab Results: No results found for this or any previous visit (from the past 48 hour(s)).  Physical Findings: AIMS:  , ,  ,  ,    CIWA:    COWS:     Musculoskeletal: Strength & Muscle Tone: within normal limits Gait & Station: normal Patient leans: N/A  Psychiatric Specialty Exam: ROS  Blood pressure 104/70, pulse 85, height 5' 4.25" (1.632 m), weight 112 lb 12.8 oz (51.166 kg).Body mass index is 19.21 kg/(m^2).  General Appearance: Casual  Eye Contact::  Good  Speech:  Clear and Coherent  Volume:  Normal  Mood:  Euthymic  Affect:  Appropriate  Thought Process:  Goal Directed  Orientation:  Full (Time, Place, and Person)  Thought Content:  WDL  Suicidal Thoughts:  No  Homicidal Thoughts:  No  Memory:  NA  Judgement:  Good  Insight:  Good  Psychomotor Activity:  Normal  Concentration:  Good  Recall:  Good  Fund of Knowledge:Good  Language: Good  Akathisia:  No  Handed:  Right  AIMS (if indicated):     Assets:  Desire for Improvement  ADL's:  Intact  Cognition: WNL  Sleep:      Treatment Plan Summary: At this time the patient continue taking Tegretol 200 mg 2 in the morning and 2 at night. The patient shows no tremor and no neurological complaints. She is no blurred vision. She's eating and sleeping very well. She demonstrates no evidence of irritability or mania. The patient does not use any alcohol or use any drugs. She seen with her family who is very supportive. They say overall she's doing very well. She's been able to maintain her emotional status. This patient to  return to see me in 5 months.  , [place] 11/25/2015, 3:25 PM

## 2016-04-27 ENCOUNTER — Ambulatory Visit (INDEPENDENT_AMBULATORY_CARE_PROVIDER_SITE_OTHER): Payer: Medicaid Other | Admitting: Psychiatry

## 2016-04-27 VITALS — BP 128/74 | HR 74 | Ht 64.25 in | Wt 112.0 lb

## 2016-04-27 DIAGNOSIS — F3162 Bipolar disorder, current episode mixed, moderate: Secondary | ICD-10-CM | POA: Diagnosis not present

## 2016-04-27 MED ORDER — CARBAMAZEPINE 200 MG PO TABS: 200.0000 mg | ORAL_TABLET | Freq: Two times a day (BID) | ORAL | Status: DC

## 2016-04-27 NOTE — Progress Notes (Signed)
Patient ID: Roberta Miller, female   DOB: [birth year], 58 y.o.   MRN: [id number] [facility] MD Progress Note  04/27/2016 2:42 PM Malkie Wille  MRN:  [id number] Subjective:  Happy Principal Problem: Bipolar disorder, most recent episode mania Diagnosis:  Bipolar disorder most recent episode mania Today the patient is seen with her family member [name]. The patient is doing very well. She's very well-dressed. She's neat and clean. Her weight is stable. The patient is not impulsive or agitated at all. She lives completely independently. She does run grocery shopping with the assistance of others and she cooks for herself. The patient handles her finances especially can. 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 The patient enjoys television she's active engaging. She denies daily depression. She is sleeping and eating well. She has a good amount of energy or mood is very stable. Presently she seeing a man and is intimate with him at times. The patient seems very stable. She lives alone. Patient medicines just as prescribed. She's having no side effects from her medicines the patient denies use of alcohol or illicit drugs. Her family is very supportive. The patient demonstrated no aggressive agitated behavior at all. She is very stable. She seems to have an increased amount insight. She and her family believes that Tegretol has really done a great job controlling her impulsivity. She's not oversedated. Patient Active Problem List   Diagnosis Date Noted  . Rash [R21] 07/27/2015  . Routine health maintenance [Z00.00] 04/09/2015  . Abnormality of gait [R26.9] 02/13/2013  . Right ovarian cyst--needs follow up u/s [N83.201] 01/14/2013  . Fracture of left knee region [S82.892A] 01/14/2013  . Polysubstance abuse: alcohol and tobacco and cocaine [F19.10] 01/09/2013  . Left upper arm pain [M79.622] 01/09/2013  . 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 Pancreatitis [K85.9] 11/09/2011  .  Nephrolithiasis [N20.0] 11/09/2011  . HTN (hypertension) [I10] 11/09/2011  . UNSPECIFIED SYPHILIS [A53.9] 02/14/2010  . Microscopic hematuria [R31.29] 12/24/2009  . FETAL ALCOHOL SYNDROME [IMO0002] 12/24/2009   Total Time spent with patient: 30 minutes  Past Psychiatric History:   Past Medical History:  Past Medical History  Diagnosis Date  . Hypertension   . Pancreatitis   . Substance abuse     alcohol and tobacco    Past Surgical History  Procedure Laterality Date  . No past surgeries     Family History:  Family History  Problem Relation Age of Onset  . Cancer Mother     died of breast cancer  . Alcoholism Mother   . Breast cancer Mother   . Hypertension Sister   . Breast cancer Sister   . Hypertension Brother   . Diabetes Brother   . Cancer - Ovarian Sister    Family Psychiatric  History:  Social History:  History  Alcohol Use  . 0.6 oz/week  . 1 Standard drinks or equivalent per week    Comment: Drinks beer. 40oz every other day     History  Drug Use No    Comment: hx of cocaine use    Social History   Social History  . Marital Status: Single    Spouse Name: N/A  . Number of Children: N/A  . Years of Education: N/A   Social History Main Topics  . Smoking status: Current Every Day Smoker -- 0.50 packs/day    Types: Cigarettes  . Smokeless tobacco: Never Used     Comment: 1/2ppd   . Alcohol Use: 0.6 oz/week    1  Standard drinks or equivalent per week     Comment: Drinks beer. 40oz every other day  . Drug Use: No     Comment: hx of cocaine use  . Sexual Activity: No   Other Topics Concern  . Not on file   Social History Narrative   Lives alone   Niece power of attorney   Gets SSI   Single         Additional Social History:                         Sleep: Good  Appetite:  Good  Current Medications: Current Outpatient Prescriptions  Medication Sig Dispense Refill  . Blood Pressure Monitor KIT To check blood pressure daily  1 each 0  . carbamazepine (TEGRETOL) 200 MG tablet Take 1 tablet (200 mg total) by mouth 2 (two) times daily. 2 bid 120 tablet 5  . hydrochlorothiazide (HYDRODIURIL) 25 MG tablet TAKE 1 TABLET (25 MG TOTAL) BY MOUTH DAILY. 90 tablet 2  . hydrocortisone cream 1 % Apply to affected area 2 times daily- Right forearm. 30 g 1  . metroNIDAZOLE (FLAGYL) 500 MG tablet Take two tablets by mouth twice a day, for one day.  Or you can take all four tablets at once if you can tolerate it. 4 tablet 0  . pantoprazole (PROTONIX) 40 MG tablet Take 1 tablet (40 mg total) by mouth daily at 12 noon. 30 tablet 12   No current facility-administered medications for this visit.    Lab Results: No results found for this or any previous visit (from the past 48 hour(s)).  Physical Findings: AIMS:  , ,  ,  ,    CIWA:    COWS:     Musculoskeletal: Strength & Muscle Tone: within normal limits Gait & Station: normal Patient leans: N/A  Psychiatric Specialty Exam: ROS [facility] MD Progress Note  04/27/2016 2:43 PM Huyen Perazzo  MRN:  [id number] Subjective:  Happy Principal Problem: Bipolar disorder, most recent episode mania Diagnosis:  Bipolar disorder most recent episode mania Today the patient is seen with an elderly family member. She's also seen with her granddaughter is 70 years old. Patient is doing very well. She 1 isolated episode where she snapped but resolved quickly. Patient has not been violent. The patient rarely has periods of agitation. For the most part she tolerates the world fairly well. She sleeping and eating well. She uses no alcohol uses no drugs. She stays active. The patient on the other hand likes living alone. People easily get on her and she's got the insight to understand. Interestingly the patient does not see a therapist. Generally she takes her medicine routinely. Only rarely does she misses it. The patient has no nausea or vomiting or any GI complaints. She has no psychotic symptoms. Her  anxiety is well-controlled. Most importantly her irritability is minimized with the use of Tegretol. She is not oversedated and has not had any falls. She is no neurological complaints. Patient Active Problem List   Diagnosis Date Noted  . Rash [R21] 07/27/2015  . Routine health maintenance [Z00.00] 04/09/2015  . Abnormality of gait [R26.9] 02/13/2013  . Right ovarian cyst--needs follow up u/s [N83.201] 01/14/2013  . Fracture of left knee region [S82.892A] 01/14/2013  . Polysubstance abuse: alcohol and tobacco and cocaine [F19.10] 01/09/2013  . Left upper arm pain [M79.622] 01/09/2013  . 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 Pancreatitis [K85.9] 11/09/2011  .  Nephrolithiasis [N20.0] 11/09/2011  . HTN (hypertension) [I10] 11/09/2011  . UNSPECIFIED SYPHILIS [A53.9] 02/14/2010  . Microscopic hematuria [R31.29] 12/24/2009  . FETAL ALCOHOL SYNDROME [IMO0002] 12/24/2009   Total Time spent with patient: 30 minutes  Past Psychiatric History:   Past Medical History:  Past Medical History  Diagnosis Date  . Hypertension   . Pancreatitis   . Substance abuse     alcohol and tobacco    Past Surgical History  Procedure Laterality Date  . No past surgeries     Family History:  Family History  Problem Relation Age of Onset  . Cancer Mother     died of breast cancer  . Alcoholism Mother   . Breast cancer Mother   . Hypertension Sister   . Breast cancer Sister   . Hypertension Brother   . Diabetes Brother   . Cancer - Ovarian Sister    Family Psychiatric  History:  Social History:  History  Alcohol Use  . 0.6 oz/week  . 1 Standard drinks or equivalent per week    Comment: Drinks beer. 40oz every other day     History  Drug Use No    Comment: hx of cocaine use    Social History   Social History  . Marital Status: Single    Spouse Name: N/A  . Number of Children: N/A  . Years of Education: N/A   Social History Main Topics  . Smoking  status: Current Every Day Smoker -- 0.50 packs/day    Types: Cigarettes  . Smokeless tobacco: Never Used     Comment: 1/2ppd   . Alcohol Use: 0.6 oz/week    1 Standard drinks or equivalent per week     Comment: Drinks beer. 40oz every other day  . Drug Use: No     Comment: hx of cocaine use  . Sexual Activity: No   Other Topics Concern  . Not on file   Social History Narrative   Lives alone   Niece power of attorney   Gets SSI   Single         Additional Social History:                         Sleep: Good  Appetite:  Good  Current Medications: Current Outpatient Prescriptions  Medication Sig Dispense Refill  . Blood Pressure Monitor KIT To check blood pressure daily 1 each 0  . carbamazepine (TEGRETOL) 200 MG tablet Take 1 tablet (200 mg total) by mouth 2 (two) times daily. 2 bid 120 tablet 5  . hydrochlorothiazide (HYDRODIURIL) 25 MG tablet TAKE 1 TABLET (25 MG TOTAL) BY MOUTH DAILY. 90 tablet 2  . hydrocortisone cream 1 % Apply to affected area 2 times daily- Right forearm. 30 g 1  . metroNIDAZOLE (FLAGYL) 500 MG tablet Take two tablets by mouth twice a day, for one day.  Or you can take all four tablets at once if you can tolerate it. 4 tablet 0  . pantoprazole (PROTONIX) 40 MG tablet Take 1 tablet (40 mg total) by mouth daily at 12 noon. 30 tablet 12   No current facility-administered medications for this visit.    Lab Results: No results found for this or any previous visit (from the past 48 hour(s)).  Physical Findings: AIMS:  , ,  ,  ,    CIWA:    COWS:     Musculoskeletal: Strength & Muscle Tone: within normal limits Gait & Station:  normal Patient leans: N/A  Psychiatric Specialty Exam: ROS  Blood pressure 128/74, pulse 74, height 5' 4.25" (1.632 m), weight 112 lb (50.803 kg).Body mass index is 19.07 kg/(m^2).  General Appearance: Casual  Eye Contact::  Good  Speech:  Clear and Coherent  Volume:  Normal  Mood:  Euthymic  Affect:   Appropriate  Thought Process:  Goal Directed  Orientation:  Full (Time, Place, and Person)  Thought Content:  WDL  Suicidal Thoughts:  No  Homicidal Thoughts:  No  Memory:  NA  Judgement:  Good  Insight:  Good  Psychomotor Activity:  Normal  Concentration:  Good  Recall:  Good  Fund of Knowledge:Good  Language: Good  Akathisia:  No  Handed:  Right  AIMS (if indicated):     Assets:  Desire for Improvement  ADL's:  Intact  Cognition: WNL  Sleep:      Treatment Plan Summary: At this time the patient continue taking Tegretol 200 mg 2 in the morning and 2 at night. The patient shows no tremor and no neurological complaints. She is no blurred vision. She's eating and sleeping very well. She demonstrates no evidence of irritability or mania. The patient does not use any alcohol or use any drugs. She seen with her family who is very supportive. They say overall she's doing very well. She's been able to maintain her emotional status. This patient to return to see me in 5 months.  Charlestine Night [facility] 04/27/2016, 2:43 PM   Blood pressure 128/74, pulse 74, height 5' 4.25" (1.632 m), weight 112 lb (50.803 kg).Body mass index is 19.07 kg/(m^2).  General Appearance: Casual  Eye Contact::  Good  Speech:  Clear and Coherent  Volume:  Normal  Mood:  Euthymic  Affect:  Appropriate  Thought Process:  Goal Directed  Orientation:  Full (Time, Place, and Person)  Thought Content:  WDL  Suicidal Thoughts:  No  Homicidal Thoughts:  No  Memory:  NA  Judgement:  Good  Insight:  Good  Psychomotor Activity:  Normal  Concentration:  Good  Recall:  Good  Fund of Knowledge:Good  Language: Good  Akathisia:  No  Handed:  Right  AIMS (if indicated):     Assets:  Desire for Improvement  ADL's:  Intact  Cognition: WNL  Sleep:      Treatment Plan Summary:At this time the patient is doing very well. She goes to the outpatient clinic [facility] for [facility]. She doing very well. She  is very medically stable. She denies any chest pain or shortness of breath. Her mood is stable and even. She demonstrates no evidence of psychosis today the patient continue taking her Tegretol 200 mg 2 in the morning and 2 at night. The patient will go ahead and get a Tegretol blood level and a conference a metabolic panel next week or 2. The patient to return to see me in 4 months. The patient is not suicidal nor she homicidal. She demonstrates no neurological symptoms at this time. She is functioning extremely well. 04/27/2016, 2:42 PM

## 2016-05-02 ENCOUNTER — Telehealth (HOSPITAL_COMMUNITY): Payer: Self-pay | Admitting: *Deleted

## 2016-05-02 NOTE — Telephone Encounter (Signed)
Prior authorization for carbamazepine received. Called [facility] spoke with [place] who states it needs to be ran for the brand name Tegretol and it will be covered. No authorization needed. Called to notify pharmacy, they state it went through and will be $3.

## 2016-05-05 ENCOUNTER — Other Ambulatory Visit: Payer: Self-pay | Admitting: Internal Medicine

## 2016-05-05 DIAGNOSIS — Z1231 Encounter for screening mammogram for malignant neoplasm of breast: Secondary | ICD-10-CM

## 2016-05-11 ENCOUNTER — Ambulatory Visit
Admission: RE | Admit: 2016-05-11 | Discharge: 2016-05-11 | Disposition: A | Discharge status: Home / Self Care | Payer: Medicaid Other | Source: Ambulatory Visit | Attending: Internal Medicine | Admitting: Internal Medicine

## 2016-05-11 DIAGNOSIS — Z1231 Encounter for screening mammogram for malignant neoplasm of breast: Secondary | ICD-10-CM

## 2016-06-20 ENCOUNTER — Encounter (HOSPITAL_COMMUNITY): Payer: Self-pay | Admitting: *Deleted

## 2016-06-20 ENCOUNTER — Emergency Department (HOSPITAL_COMMUNITY): Payer: Medicaid Other

## 2016-06-20 ENCOUNTER — Inpatient Hospital Stay (HOSPITAL_COMMUNITY)
Admission: EM | Admit: 2016-06-20 | Discharge: 2016-06-22 | DRG: 384 | Disposition: A | Discharge status: Home / Self Care | Payer: Medicaid Other | Attending: Internal Medicine | Admitting: Internal Medicine

## 2016-06-20 DIAGNOSIS — K253 Acute gastric ulcer without hemorrhage or perforation: Secondary | ICD-10-CM | POA: Diagnosis present

## 2016-06-20 DIAGNOSIS — F17210 Nicotine dependence, cigarettes, uncomplicated: Secondary | ICD-10-CM | POA: Diagnosis present

## 2016-06-20 DIAGNOSIS — F79 Unspecified intellectual disabilities: Secondary | ICD-10-CM | POA: Diagnosis present

## 2016-06-20 DIAGNOSIS — K2920 Alcoholic gastritis without bleeding: Secondary | ICD-10-CM

## 2016-06-20 DIAGNOSIS — Q860 Fetal alcohol syndrome (dysmorphic): Secondary | ICD-10-CM | POA: Diagnosis not present

## 2016-06-20 DIAGNOSIS — F1020 Alcohol dependence, uncomplicated: Secondary | ICD-10-CM | POA: Diagnosis present

## 2016-06-20 DIAGNOSIS — E876 Hypokalemia: Secondary | ICD-10-CM | POA: Diagnosis present

## 2016-06-20 DIAGNOSIS — K2970 Gastritis, unspecified, without bleeding: Secondary | ICD-10-CM | POA: Diagnosis present

## 2016-06-20 DIAGNOSIS — K273 Acute peptic ulcer, site unspecified, without hemorrhage or perforation: Secondary | ICD-10-CM

## 2016-06-20 DIAGNOSIS — K279 Peptic ulcer, site unspecified, unspecified as acute or chronic, without hemorrhage or perforation: Secondary | ICD-10-CM | POA: Diagnosis present

## 2016-06-20 DIAGNOSIS — K8689 Other specified diseases of pancreas: Secondary | ICD-10-CM | POA: Diagnosis present

## 2016-06-20 DIAGNOSIS — F17200 Nicotine dependence, unspecified, uncomplicated: Secondary | ICD-10-CM

## 2016-06-20 DIAGNOSIS — R1013 Epigastric pain: Secondary | ICD-10-CM | POA: Diagnosis present

## 2016-06-20 DIAGNOSIS — K219 Gastro-esophageal reflux disease without esophagitis: Secondary | ICD-10-CM | POA: Diagnosis present

## 2016-06-20 DIAGNOSIS — Z79899 Other long term (current) drug therapy: Secondary | ICD-10-CM

## 2016-06-20 DIAGNOSIS — I10 Essential (primary) hypertension: Secondary | ICD-10-CM | POA: Diagnosis present

## 2016-06-20 HISTORY — DX: Fetal alcohol syndrome (dysmorphic): Q86.0

## 2016-06-20 HISTORY — DX: Peptic ulcer, site unspecified, unspecified as acute or chronic, without hemorrhage or perforation: K27.9

## 2016-06-20 LAB — URINALYSIS, ROUTINE W REFLEX MICROSCOPIC
Bilirubin Urine: NEGATIVE
GLUCOSE, UA: NEGATIVE mg/dL
KETONES UR: NEGATIVE mg/dL
LEUKOCYTES UA: NEGATIVE
NITRITE: NEGATIVE
PH: 5 (ref 5.0–8.0)
Protein, ur: 100 mg/dL — AB
SPECIFIC GRAVITY, URINE: 1.019 (ref 1.005–1.030)

## 2016-06-20 LAB — BASIC METABOLIC PANEL
ANION GAP: 8 (ref 5–15)
BUN: 9 mg/dL (ref 6–20)
CALCIUM: 8.8 mg/dL — AB (ref 8.9–10.3)
CO2: 28 mmol/L (ref 22–32)
CREATININE: 0.9 mg/dL (ref 0.44–1.00)
Chloride: 101 mmol/L (ref 101–111)
Glucose, Bld: 96 mg/dL (ref 65–99)
Potassium: 3.3 mmol/L — ABNORMAL LOW (ref 3.5–5.1)
SODIUM: 137 mmol/L (ref 135–145)

## 2016-06-20 LAB — COMPREHENSIVE METABOLIC PANEL
ALT: 13 U/L — ABNORMAL LOW (ref 14–54)
ANION GAP: 9 (ref 5–15)
AST: 21 U/L (ref 15–41)
Albumin: 3.9 g/dL (ref 3.5–5.0)
Alkaline Phosphatase: 65 U/L (ref 38–126)
BILIRUBIN TOTAL: 0.6 mg/dL (ref 0.3–1.2)
BUN: 12 mg/dL (ref 6–20)
CHLORIDE: 94 mmol/L — AB (ref 101–111)
CO2: 27 mmol/L (ref 22–32)
Calcium: 9.2 mg/dL (ref 8.9–10.3)
Creatinine, Ser: 0.86 mg/dL (ref 0.44–1.00)
GFR calc Af Amer: >60 mL/min (ref >60)
Glucose, Bld: 120 mg/dL — ABNORMAL HIGH (ref 65–99)
POTASSIUM: 3.3 mmol/L — AB (ref 3.5–5.1)
Sodium: 130 mmol/L — ABNORMAL LOW (ref 135–145)
TOTAL PROTEIN: 6.9 g/dL (ref 6.5–8.1)

## 2016-06-20 LAB — CBC
HEMATOCRIT: 41.6 % (ref 36.0–46.0)
HEMOGLOBIN: 14 g/dL (ref 12.0–15.0)
MCH: 31.5 pg (ref 26.0–34.0)
MCHC: 33.7 g/dL (ref 30.0–36.0)
MCV: 93.5 fL (ref 78.0–100.0)
Platelets: 291 10*3/uL (ref 150–400)
RBC: 4.45 MIL/uL (ref 3.87–5.11)
RDW: 13.7 % (ref 11.5–15.5)
WBC: 15.2 10*3/uL — AB (ref 4.0–10.5)

## 2016-06-20 LAB — URINE MICROSCOPIC-ADD ON

## 2016-06-20 LAB — LIPASE, BLOOD: LIPASE: 30 U/L (ref 11–51)

## 2016-06-20 MED ORDER — SODIUM CHLORIDE 0.9 % IV SOLN: INTRAVENOUS | Status: DC

## 2016-06-20 MED ORDER — IOPAMIDOL (ISOVUE-300) INJECTION 61%
80.0000 mL | Freq: Once | INTRAVENOUS | Status: AC | PRN
  Administered 2016-06-20: 80 mL via INTRAVENOUS

## 2016-06-20 MED ORDER — THIAMINE HCL 100 MG/ML IJ SOLN
100.0000 mg | Freq: Every day | INTRAMUSCULAR | Status: DC
  Administered 2016-06-20: 100 mg via INTRAVENOUS
  Filled 2016-06-20: qty 2

## 2016-06-20 MED ORDER — FAMOTIDINE IN NACL 20-0.9 MG/50ML-% IV SOLN
20.0000 mg | Freq: Two times a day (BID) | INTRAVENOUS | Status: DC
  Administered 2016-06-20: 20 mg via INTRAVENOUS
  Filled 2016-06-20 (×2): qty 50

## 2016-06-20 MED ORDER — POTASSIUM CHLORIDE 10 MEQ/100ML IV SOLN
10.0000 meq | Freq: Once | INTRAVENOUS | Status: AC
  Administered 2016-06-20: 10 meq via INTRAVENOUS
  Filled 2016-06-20: qty 100

## 2016-06-20 MED ORDER — MORPHINE SULFATE (PF) 2 MG/ML IV SOLN
1.0000 mg | INTRAVENOUS | Status: DC | PRN
  Administered 2016-06-20 – 2016-06-21 (×2): 1 mg via INTRAVENOUS
  Filled 2016-06-20 (×2): qty 1

## 2016-06-20 MED ORDER — FOLIC ACID 1 MG PO TABS
1.0000 mg | ORAL_TABLET | Freq: Every day | ORAL | Status: DC
  Administered 2016-06-22: 1 mg via ORAL
  Filled 2016-06-20: qty 1

## 2016-06-20 MED ORDER — FAMOTIDINE IN NACL 20-0.9 MG/50ML-% IV SOLN
20.0000 mg | Freq: Once | INTRAVENOUS | Status: AC
  Administered 2016-06-20: 20 mg via INTRAVENOUS
  Filled 2016-06-20: qty 50

## 2016-06-20 MED ORDER — SUCRALFATE 1 G PO TABS
1.0000 g | ORAL_TABLET | Freq: Four times a day (QID) | ORAL | Status: DC
  Administered 2016-06-20: 1 g via ORAL
  Filled 2016-06-20: qty 1

## 2016-06-20 MED ORDER — LORAZEPAM 2 MG/ML IJ SOLN: 1.0000 mg | Freq: Four times a day (QID) | INTRAMUSCULAR | Status: DC | PRN

## 2016-06-20 MED ORDER — LORAZEPAM 1 MG PO TABS: 1.0000 mg | ORAL_TABLET | Freq: Four times a day (QID) | ORAL | Status: DC | PRN

## 2016-06-20 MED ORDER — MORPHINE SULFATE (PF) 4 MG/ML IV SOLN
4.0000 mg | Freq: Once | INTRAVENOUS | Status: AC
  Administered 2016-06-20: 4 mg via INTRAVENOUS
  Filled 2016-06-20: qty 1

## 2016-06-20 MED ORDER — ADULT MULTIVITAMIN W/MINERALS CH
1.0000 | ORAL_TABLET | Freq: Every day | ORAL | Status: DC
  Administered 2016-06-22: 1 via ORAL
  Filled 2016-06-20: qty 1

## 2016-06-20 MED ORDER — SUCRALFATE 1 G PO TABS
1.0000 g | ORAL_TABLET | Freq: Once | ORAL | Status: AC
  Administered 2016-06-20: 1 g via ORAL
  Filled 2016-06-20: qty 1

## 2016-06-20 MED ORDER — POTASSIUM CHLORIDE IN NACL 20-0.9 MEQ/L-% IV SOLN
INTRAVENOUS | Status: AC
  Administered 2016-06-20: 09:00:00 via INTRAVENOUS
  Filled 2016-06-20: qty 1000

## 2016-06-20 MED ORDER — PANTOPRAZOLE SODIUM 40 MG PO TBEC
40.0000 mg | DELAYED_RELEASE_TABLET | Freq: Two times a day (BID) | ORAL | Status: DC
Start: 2016-06-20 — End: 2016-06-20

## 2016-06-20 MED ORDER — SODIUM CHLORIDE 0.9 % IV BOLUS (SEPSIS)
1000.0000 mL | Freq: Once | INTRAVENOUS | Status: AC
  Administered 2016-06-20: 1000 mL via INTRAVENOUS

## 2016-06-20 MED ORDER — GI COCKTAIL ~~LOC~~
30.0000 mL | Freq: Once | ORAL | Status: AC
  Administered 2016-06-20: 30 mL via ORAL
  Filled 2016-06-20: qty 30

## 2016-06-20 MED ORDER — VITAMIN B-1 100 MG PO TABS
100.0000 mg | ORAL_TABLET | Freq: Every day | ORAL | Status: DC
  Administered 2016-06-22: 100 mg via ORAL
  Filled 2016-06-20: qty 1

## 2016-06-20 MED ORDER — PANTOPRAZOLE SODIUM 40 MG PO TBEC
40.0000 mg | DELAYED_RELEASE_TABLET | Freq: Two times a day (BID) | ORAL | Status: DC
  Administered 2016-06-20 – 2016-06-22 (×3): 40 mg via ORAL
  Filled 2016-06-20 (×3): qty 1

## 2016-06-20 MED ORDER — ONDANSETRON HCL 4 MG/2ML IJ SOLN
4.0000 mg | Freq: Once | INTRAMUSCULAR | Status: AC
  Administered 2016-06-20: 4 mg via INTRAVENOUS
  Filled 2016-06-20: qty 2

## 2016-06-20 NOTE — H&P (Signed)
Date: 06/20/2016               Patient Name:  Roberta Miller MRN: [id number]  DOB: 1956-03-26 Age / Sex: 60 y.o., female   PCP: Milagros Loll, MD         Medical Service: Internal Medicine Teaching Service         Attending Physician: Dr. Axel Filler, MD    First Contact: Dr. Ophelia Shoulder, MD Pager: [phone]  Second Contact: Dr. Dellia Nims, MD Pager: [phone]       After Hours (After 5p/  First Contact Pager: [phone]  weekends / holidays): Second Contact Pager: [phone]   Chief Complaint: abdominal pain.  History of Present Illness:  Roberta Miller is a 60 y/o female with MHx significant for GERD, HTN, pancreatitis, and polysubstance abuse who presents for evaluation of abdominal pain. Pt reports that for the past 2 weeks she's been experiencing gradually worsening abdominal pain which is currently rated 10/10 and described as sharp and non-radiating. Abdominal pain is worsened by alcohol use. She has not tried any OTC medications or antacid medications for her discomfort. She reports associated nausea with 3 episodes of non-bloody and non-bilious vomiting. She admits to chills. Denies heartburn, fever, chest pain, shortness of breath, dysuria, diarrhea or constipation, blood in stool or dark tarry stools.  Per patients family in the room, she is a heavy drinker. Last drink was 4 cups of brown liquor on Thursday evening.   In the ED, patient had a CT abdomen/pelvis with contrast which demonstrated inflammatory changes around pyloric region of stomach and proximal duodenum, likely representing PUD. The edema extends around the head of the pancreas, suggesting a penetrating ulcer. She was given IV Pepcid, Morphine, Zofran, GI cocktail with little relief of her symptoms.   Meds:  Current Meds  Medication Sig  . carbamazepine (TEGRETOL) 200 MG tablet Take 1 tablet (200 mg total) by mouth 2 (two) times daily. 2 bid (Patient taking differently: Take 400 mg by mouth 2 (two)  times daily. )  . hydrochlorothiazide (HYDRODIURIL) 25 MG tablet TAKE 1 TABLET (25 MG TOTAL) BY MOUTH DAILY.    Allergies: Allergies as of 06/19/2016 - Review Complete 04/27/2016  Allergen Reaction Noted  . Other Rash 01/09/2013   Past Medical History:  Diagnosis Date  . Hypertension   . Pancreatitis   . Substance abuse    alcohol and tobacco   Family History:  Mother: deceased, breast cancer. Alcoholism  Social History:  Tobacco: 1/2 pk/ day x 30+ years Alcohol: Patient reports "she doesn't drink that much" but family in the room state otherwise Drugs: denied use but chart shows hx of cocaine abuse  Review of Systems: A complete ROS was negative except as per HPI.   Physical Exam: Blood pressure 136/98, pulse 82, temperature 97.9 F (36.6 C), temperature source Oral, resp. rate 20, height 5\' 10"  (1.778 m), weight 54.4 kg (120 lb), SpO2 99 %. General: Alert, comfortable appearing, in no acute distress.  HENT: Normocephalic, atraumatic. Poor dentition. Mucous membranes moist.   Cardiovascular: Regular rate and rhythm, without murmur or rub Pulmonary: CTA BL, diminished BL, without wheezing or crackles Abdomen: Mild epigastric tenderness as well as BL LQ tenderness to palpation. No rebound, guarding or rigidity.  Extremities: without edema  Skin: warm and dry  CT Abdomen/Pelvis w/ Contrast: inflammatory changes around the pyloric region of stomach and proximal duodenum, likely representing PUD. Edema extends around the head of pancreas suggesting penetrating  ulcer or secondary inflammation of the pancreas. Mild pancreatic ductal dilation. No specific mass or fluid collection identified.   Assessment & Plan by Problem: Principal Problem:   Acute peptic ulcer without hemorrhage or perforation Active Problems:   HTN (hypertension)   Polysubstance abuse: alcohol and tobacco and cocaine  1. Acute peptic ulcer without hemorrhage or perforation Patient complains of gradually  worsening abdominal pain with Hx of GERD and CT scan likely representing PUD. Has had 3 episodes of non-bloody and non-bilious emesis. Pt denies any dark tarry or bloody stools and has not taken any OTC or prescribed antacids for many years. Pts family endorses a long history of alcohol use which exacerbated her abdominal pain.  Due to patients significant alcohol history, esophageal varicies are also on the differential.  She was given 2L ns in ED.  Lipase normal. WBC elevated at 15.2. Hemoglobin stable at 14.0. Patient NPO and started on IV Famotidine 20mg  and PO Carafate. Pt NPO as she will likely need EGD tomorrow.   2. Polysubstance abuse: alcohol, tobacco and cocaine Patient admits to drinking 4 cups of gin Thursday evening but patient not forthcoming about the remainder of her alcohol history.  Denies any withdrawal syx at this time. On CIWA.   3. Hypertension Stable at this time. Patient takes HCTZ 25mg  at home.  4. Hypokalemia Potassium 3.3. Patient given 97mEq KCL in ED. Will monitor.  Diet: NPO DVT Prophylaxis: SCD's IVF: NS @100mL /hr Code status: full code  Dispo: Admit patient to Observation with expected length of stay less than 2 midnights.  SignedEinar Gip, DO 06/20/2016, 3:40 AM  Pager: [phone]

## 2016-06-20 NOTE — Progress Notes (Signed)
PT admitted to room 531 alert and oriented x 4 moe x4 Pt denies pain at this time.no skin issues noted, pt verbalized and demonstrated proper use of call bell and knows to call out for assistance.

## 2016-06-20 NOTE — Consult Note (Signed)
[facility] Consult: 11:12 AM 06/20/2016  LOS: 0 days    Referring Provider: Dr Evette Doffing  Primary Care Physician:  Jacques Earthly, MD Primary Gastroenterologist:  unassigned  Psych:  Norma Fredrickson MD Niece [name] is HCPOA [phone] Sister [name] [phone]    Reason for Consultation:  Pyloric/duodenal ulcer disease per CT   HPI: Roberta Miller is a 60 y.o. female.  Hx fetal alcohol syndrome, cognitive impairment, mental retardation.  Pancreatitis presumed etoh related, 2013 (Lipase 160, normal non-contrast CT).  Substance abuse.  Conduct disorder.  Nephrolithiasis. Htn.   Hx of GERD sxs but these have not been an issue for a few years.  No ASA or NSAIDs.  Appetitei generally good, stable weight and thin stature.  Admits to 24 oz beer every other day.  No regular consumption of spirits but last week drank several oz of "brown liquor".  Soon after had mid-abdominal pain.  It went away without her taking any prn meds.  Pain and non-bloody n/v recurred yesterday, so her friend drove her to the ED.    CT shows changes in pylorus and prx duodenum sugg off ulcer.  Edema at head of pancreas suggests penetrating ulcer vs secondary inflammation.  Pancreatic duct is mildly dilated.   Lipase and LFTs are normal.  WBC 15.2  Started on Carafate, IV Pepcid, NPO  She is hungry and not having nausea or pain this morning. Daily brown stools    Past Medical History:  Diagnosis Date  . Hypertension   . Pancreatitis   . Substance abuse    alcohol and tobacco    Past Surgical History:  Procedure Laterality Date  . NO PAST SURGERIES      Prior to Admission medications   Medication Sig Start Date End Date Taking? Authorizing Provider  carbamazepine (TEGRETOL) 200 MG tablet Take 1 tablet (200 mg  total) by mouth 2 (two) times daily. 2 bid Patient taking differently: Take 400 mg by mouth 2 (two) times daily.  04/27/16  Yes Norma Fredrickson, MD  hydrochlorothiazide (HYDRODIURIL) 25 MG tablet TAKE 1 TABLET (25 MG TOTAL) BY MOUTH DAILY. 06/26/15  Yes Milagros Loll, MD  Blood Pressure Monitor KIT To check blood pressure daily 01/09/13   Wilber Oliphant, MD  hydrocortisone cream 1 % Apply to affected area 2 times daily- Right forearm. Patient not taking: Reported on 06/20/2016 07/27/15 07/26/16  Ejiroghene Arlyce Dice, MD  metroNIDAZOLE (FLAGYL) 500 MG tablet Take two tablets by mouth twice a day, for one day.  Or you can take all four tablets at once if you can tolerate it. Patient not taking: Reported on 06/20/2016 09/16/13   Lavonia Drafts, MD  pantoprazole (PROTONIX) 40 MG tablet Take 1 tablet (40 mg total) by mouth daily at 12 noon. Patient not taking: Reported on 06/20/2016 05/21/13 06/20/16  Ricke Hey, MD    Scheduled Meds: . folic acid  1 mg Oral Daily  . multivitamin with minerals  1 tablet Oral Daily  . thiamine  100 mg Oral Daily  Or  . thiamine  100 mg Intravenous Daily   Infusions: . 0.9 % NaCl with KCl 20 mEq / L 100 mL/hr at 06/20/16 0906   PRN Meds: LORazepam **OR** LORazepam, morphine injection   Allergies as of 06/19/2016 - Review Complete 04/27/2016  Allergen Reaction Noted  . Other Rash 01/09/2013    Family History  Problem Relation Age of Onset  . Cancer Mother     died of breast cancer  . Alcoholism Mother   . Breast cancer Mother   . Hypertension Sister   . Breast cancer Sister   . Hypertension Brother   . Diabetes Brother   . Cancer - Ovarian Sister     Social History   Social History  . Marital status: Single    Spouse name: N/A  . Number of children: N/A  . Years of education: N/A   Occupational History  . Not on file.   Social History Main Topics  . Smoking status: Current Every Day Smoker    Packs/day: 0.50    Types:  Cigarettes  . Smokeless tobacco: Never Used     Comment: 1/2ppd   . Alcohol use 0.6 oz/week    1 Standard drinks or equivalent per week     Comment: Drinks beer. 40oz every other day  . Drug use: No     Comment: hx of cocaine use  . Sexual activity: No   Other Topics Concern  . Not on file   Social History Narrative   Lives alone   Niece power of attorney   Gets SSI   Single          REVIEW OF SYSTEMS: Constitutional:  Generally doing well at her appt. ENT:  No nose bleeds Pulm:  No cough or dyspnea CV:  No palpitations, no LE edema.  GU:  No hematuria, no frequency GI:  Per HPI.   Heme:  No issues with bleeding or bruising   Transfusions:  none Neuro:  No headaches, no peripheral tingling or numbness Derm:  No itching, no rash or sores.  Endocrine:  No sweats or chills.  No polyuria or dysuria Immunization:  Reviewed.  Travel:  None beyond local counties in last few months.    PHYSICAL EXAM: Vital signs in last 24 hours: Vitals:   06/20/16 0345 06/20/16 0414  BP: 111/93 123/83  Pulse: 74 85  Resp:  18  Temp:  98.2 F (36.8 C)   Wt Readings from Last 3 Encounters:  06/20/16 49.9 kg (110 lb)  07/27/15 48.9 kg (107 lb 12.8 oz)  09/11/13 51.8 kg (114 lb 3.2 oz)    General: cooperative, dishevelled, developmentally delayed appearance/affect  AAF.  Not acutely ill looking Head:  No swelling or trauma  Eyes:  No icterus or pallor Ears:  Not HOH  Nose:  No congestion Mouth:  Moist, clear oral MM.  Teeth cooked, many missing.  Repair of remainder is fair.  Neck:  No mass, no JVD. Lungs:  Clear bil.  No cough or labored breathing Heart: RRR.  No MRG.   Abdomen:  Soft, NT, ND.  Active BS.   Rectal: deferred.     Musc/Skeltl: no joint swelling or contracture Extremities:  No CCE  Neurologic:  Oriented to place, self, nnot to year.   Skin:  No rash, sores.   Tattoos:  None.     Psych:  A bit agitated.    Intake/Output from previous day: 08/13 0701 - 08/14  0700 In: 24 [P.O.:60]  Out: -  Intake/Output this shift: No intake/output data recorded.  LAB RESULTS:  Recent Labs  06/20/16 0049  WBC 15.2*  HGB 14.0  HCT 41.6  PLT 291   BMET Lab Results  Component Value Date   NA 130 (L) 06/20/2016   NA 143 07/27/2015   NA 140 02/24/2015   K 3.3 (L) 06/20/2016   K 4.1 07/27/2015   K 4.0 02/24/2015   CL 94 (L) 06/20/2016   CL 101 07/27/2015   CL 105 02/24/2015   CO2 27 06/20/2016   CO2 24 07/27/2015   CO2 26 02/24/2015   GLUCOSE 120 (H) 06/20/2016   GLUCOSE 93 07/27/2015   GLUCOSE 75 02/24/2015   BUN 12 06/20/2016   BUN 17 07/27/2015   BUN 15 02/24/2015   CREATININE 0.86 06/20/2016   CREATININE 0.94 07/27/2015   CREATININE 0.96 02/24/2015   CALCIUM 9.2 06/20/2016   CALCIUM 10.1 07/27/2015   CALCIUM 9.1 02/24/2015   LFT  Recent Labs  06/20/16 0049  PROT 6.9  ALBUMIN 3.9  AST 21  ALT 13*  ALKPHOS 65  BILITOT 0.6   PT/INR Lab Results  Component Value Date   INR 1.17 11/10/2011   Hepatitis Panel No results for input(s): HEPBSAG, HCVAB, HEPAIGM, HEPBIGM in the last 72 hours. C-Diff No components found for: CDIFF Lipase     Component Value Date/Time   LIPASE 30 06/20/2016 0049    Drugs of Abuse  No results found for: LABOPIA, COCAINSCRNUR, LABBENZ, AMPHETMU, THCU, LABBARB   RADIOLOGY STUDIES: Ct Abdomen Pelvis W Contrast  Result Date: 06/20/2016 CLINICAL DATA:  Abdominal pain for 2 weeks after drinking 4 glasses of brown liquor. Nausea, vomiting, and severe abdominal pain. Possibly urinary tract infection. EXAM: CT ABDOMEN AND PELVIS WITH CONTRAST TECHNIQUE: Multidetector CT imaging of the abdomen and pelvis was performed using the standard protocol following bolus administration of intravenous contrast. CONTRAST:  20m ISOVUE-300 IOPAMIDOL (ISOVUE-300) INJECTION 61% COMPARISON:  11/09/2011 FINDINGS: Mild atelectasis in the lung bases. The pyloric region of the stomach appears thickened with edema around the  pylorus, duodenal C-loop, and head of pancreas. Mild pancreatic ductal dilatation. Changes suggest an inflammatory process, likely peptic ulcer disease with secondary inflammation of the pancreas. No specific mass lesion is demonstrated. No focal fluid collections. The liver, spleen, adrenal glands, kidneys, abdominal aorta, inferior vena cava, and retroperitoneal lymph nodes are unremarkable. Stomach, small bowel, and colon are not abnormally distended. No free air or free fluid in the abdomen. Pelvis: Appendix is normal. Bladder wall is mildly thickened which may indicate cystitis. No pelvic mass or lymphadenopathy. No free or loculated pelvic fluid collections. No destructive bone lesions. IMPRESSION: Inflammatory changes around the pyloric region of the stomach and proximal duodenum likely representing peptic ulcer disease. Edema extends around the head of the pancreas suggesting penetrating ulcer or secondary inflammation of the pancreas. Mild pancreatic ductal dilatation. Follow-up after resolution of acute process is suggested to exclude underlying pancreatic mass lesion. Electronically Signed   By: WLucienne CapersM.D.   On: 06/20/2016 02:39    ENDOSCOPIC STUDIES: Nothing in Epic. Pt thinks she may have had colonoscopy but nothing found to document this.   IMPRESSION:   *  Abdominal pain and non-bloody emesis.   CT suggestive of penetrating PUD at pyloris, upper duodenum with secondary involvement into pancreas.   *  Pancreatic ductal dilatation.  ? Due to ulcer process.  Lipase and LFTs normal.  Hx of pancreatitis in 2013.  She drinks 24  oz beer every other day, drank spirits last week prior to onset of sxs.  Hx of pancreatitis and substance abuse  *  Fetal alcohol syndrome, cognitive impairment.  Niece is her 55 and helps her with bills.  Niece is her POA and HCPOA,     *  Hyponatremia. K 3.3.    PLAN:     *  Switch off Carafate and IV Pepcid onto BID po Protonix.    *  EGD  tomorrow.  Set for 12:30 with MAC sedation.     *  Allow full liquids today.    Azucena Freed  06/20/2016, 11:12 AM Pager: [phone]    ________________________________________________________________________  Velora Heckler GI MD note:  I personally examined the patient, reviewed the data and agree with the assessment and plan described above.  Likely PUD (H. Pylori?).  Planning on EGD tomorrow.  Agree with change to twice daily oral PPI and will allow her to eat full liquids today.     Owens Loffler, MD [facility] Pager [phone]

## 2016-06-20 NOTE — ED Notes (Signed)
Called for treatment room x1

## 2016-06-20 NOTE — ED Triage Notes (Signed)
The pt report that she has had  abd pain with nv since Thursday after she drank 4 glasses of brown liquor.  She has been feeling ill since then

## 2016-06-20 NOTE — Progress Notes (Signed)
   Subjective: No acute events overnight. Patient states that her pain is improved this morning. She remains tender to palpation in the midepigastric region. She denies any episodes of nausea, vomiting or diarrhea. She has no additional acute complaints or concerns this morning.  Objective:  Vital signs in last 24 hours: Vitals:   06/20/16 0300 06/20/16 0330 06/20/16 0345 06/20/16 0414  BP: 110/85 136/98 111/93 123/83  Pulse: 81 82 74 85  Resp:    18  Temp:    98.2 F (36.8 C)  TempSrc:      SpO2: 98% 99% 97% 98%  Weight:    110 lb (49.9 kg)  Height:    5\' 10"  (1.778 m)   Physical Exam  Constitutional: She is oriented to person, place, and time. She appears well-developed and well-nourished.  In no acute distress.  HENT:  Head: Normocephalic and atraumatic.  Extremely poor dentition with multiple teeth missing  Cardiovascular: Normal rate and regular rhythm.  Exam reveals no gallop and no friction rub.   No murmur heard. Respiratory: Effort normal and breath sounds normal. No respiratory distress. She has no wheezes.  GI: Soft. Bowel sounds are normal. She exhibits no distension. There is tenderness.  Patient tender to palpation in the midepigastric region. No abdominal bruits auscultated  Musculoskeletal: Normal range of motion. She exhibits no edema.  Neurological: She is alert and oriented to person, place, and time.     Assessment/Plan: Ms. Griesel is a 60 year old female with a history of gastroesophageal reflux disease, hypertension, pancreatitis and polysubstance abuse who presents with a two-week history of abdominal pain.  1. Abdominal Pain -- Patient endorses gradually worsening abdominal pain over the past 2 weeks. She said this pain acutely worsened over the weekend and she had 3 episodes of nonbloody nonbilious emesis. Patient with a significant alcohol abuse disorder. The differential diagnosis includes alcohol-induced gastritis, peptic ulcer disease, Mallory-Weiss  tear or increase in the patient's gastric esophageal reflux disease. -- Lipase normal, leukocytosis at 15.2 -- NPO -- IV famotidine 20 mg and PO Carafate -- Gastroenterology consult  for EGD  2. Polysubstance abuse disorder -- Patient with a significant history of alcohol abuse disorder. She endorsed Reinking 4 cups of June on Thursday evening. She says she has not had a drink since then. -- CIWA protocol  3. Hypertension -- Takes HCTZ 25 mg once daily home. Currently normotensive -- We'll hold home antihypertensive medication  4. Hypokalemia -- Most recent potassium of 3.3 -- Patient given 20 mEq of potassium for replacement -- We'll follow up repeat BMP   Dispo: Anticipated discharge in approximately 1-2 day(s).   Ophelia Shoulder, MD 06/20/2016, 10:29 AM Pager: [phone]

## 2016-06-20 NOTE — ED Provider Notes (Signed)
[place] DEPT Provider Note   CSN: [id number] Arrival date & time: 06/20/16  0000  First Provider Contact:  First MD Initiated Contact with Patient 06/20/16 0038    By signing my name below, I, Reola Mosher, attest that this documentation has been prepared under the direction and in the presence of Merryl Hacker, MD. Electronically Signed: Reola Mosher, ED Scribe. 06/20/16. 12:49 AM.  History   Chief Complaint Chief Complaint  Patient presents with  . Abdominal Pain   The history is provided by the patient. No language interpreter was used.   HPI Comments: Roberta Miller is a 60 y.o. female with a PMHX of HTN, pancreatitis, nephrolithiasis, and substance abuse, who presents to the Emergency Department complaining of gradual onset, gradually worsening, intermittent, sharp, 10/10 upper abdominal pain onset ~2 weeks ago. Pt states that she has associated nausea and vomiting secondary to her pain. She denies any alcohol abuse, but states that her pain was mildly worsened after drinking "four glasses of brown liquor" ~4 days PTA. States that her pain is mildly and temporarily relieved with emesis. No OTC medications or home remedies tried PTA. No PSHx of Cholecystectomy. NKDA. No sick contact with similar symptoms. Denies diarrhea, hematemesis, bloody stools, dysuria, hematuria, frequency, urgency, or any other associated symptoms.      Past Medical History:  Diagnosis Date  . Hypertension   . Pancreatitis   . Substance abuse    alcohol and tobacco   Patient Active Problem List   Diagnosis Date Noted  . Rash 07/27/2015  . Routine health maintenance 04/09/2015  . Abnormality of gait 02/13/2013  . Right ovarian cyst--needs follow up u/s 01/14/2013  . Fracture of left knee region 01/14/2013  . Polysubstance abuse: alcohol and tobacco and cocaine 01/09/2013  . Left upper arm pain 01/09/2013  . Adjustment disorder with disturbance of conduct 07/18/2012  .  Pancreatitis 11/09/2011  . Nephrolithiasis 11/09/2011  . HTN (hypertension) 11/09/2011  . UNSPECIFIED SYPHILIS 02/14/2010  . Microscopic hematuria 12/24/2009  . FETAL ALCOHOL SYNDROME 12/24/2009   Past Surgical History:  Procedure Laterality Date  . NO PAST SURGERIES     OB History    Gravida Para Term Preterm AB Living   1 1       1    SAB TAB Ectopic Multiple Live Births                 Home Medications    Prior to Admission medications   Medication Sig Start Date End Date Taking? Authorizing Provider  carbamazepine (TEGRETOL) 200 MG tablet Take 1 tablet (200 mg total) by mouth 2 (two) times daily. 2 bid Patient taking differently: Take 400 mg by mouth 2 (two) times daily.  04/27/16  Yes Norma Fredrickson, MD  hydrochlorothiazide (HYDRODIURIL) 25 MG tablet TAKE 1 TABLET (25 MG TOTAL) BY MOUTH DAILY. 06/26/15  Yes Milagros Loll, MD  Blood Pressure Monitor KIT To check blood pressure daily 01/09/13   Wilber Oliphant, MD  hydrocortisone cream 1 % Apply to affected area 2 times daily- Right forearm. Patient not taking: Reported on 06/20/2016 07/27/15 07/26/16  Ejiroghene Arlyce Dice, MD  metroNIDAZOLE (FLAGYL) 500 MG tablet Take two tablets by mouth twice a day, for one day.  Or you can take all four tablets at once if you can tolerate it. Patient not taking: Reported on 06/20/2016 09/16/13   Lavonia Drafts, MD  pantoprazole (PROTONIX) 40 MG tablet Take 1 tablet (40 mg total) by mouth daily at  12 noon. Patient not taking: Reported on 06/20/2016 05/21/13 06/20/16  Ricke Hey, MD   Family History Family History  Problem Relation Age of Onset  . Cancer Mother     died of breast cancer  . Alcoholism Mother   . Breast cancer Mother   . Hypertension Sister   . Breast cancer Sister   . Hypertension Brother   . Diabetes Brother   . Cancer - Ovarian Sister    Social History Social History  Substance Use Topics  . Smoking status: Current Every Day Smoker    Packs/day:  0.50    Types: Cigarettes  . Smokeless tobacco: Never Used     Comment: 1/2ppd   . Alcohol use 0.6 oz/week    1 Standard drinks or equivalent per week     Comment: Drinks beer. 40oz every other day   Allergies   Other  Review of Systems Review of Systems  Constitutional: Negative for fever.  Gastrointestinal: Positive for abdominal pain, nausea and vomiting. Negative for blood in stool and diarrhea.       Negative for hemotemesis  Genitourinary: Negative for dysuria, frequency, hematuria and urgency.  All other systems reviewed and are negative.  Physical Exam Updated Vital Signs BP 120/96   Pulse 87   Temp 97.9 F (36.6 C) (Oral)   Resp 20   Ht 5' 10"  (1.778 m)   Wt 120 lb (54.4 kg)   SpO2 100%   BMI 17.22 kg/m   Physical Exam  Constitutional: She is oriented to person, place, and time. She appears well-developed and well-nourished. No distress.  Uncomfortable appearing  HENT:  Head: Normocephalic and atraumatic.  Poor dentition  Cardiovascular: Normal rate, regular rhythm and normal heart sounds.   No murmur heard. Pulmonary/Chest: Effort normal. No respiratory distress. She has no wheezes.  Abdominal: Soft. Bowel sounds are normal. She exhibits no distension and no mass. There is tenderness. There is no guarding.  Epigastric tenderness to palpation without rebound or guarding  Musculoskeletal: Normal range of motion.  Neurological: She is alert and oriented to person, place, and time.  Skin: Skin is warm and dry.  Psychiatric: She has a normal mood and affect. Her behavior is normal.  Nursing note and vitals reviewed.  ED Treatments / Results  DIAGNOSTIC STUDIES: Oxygen Saturation is 99% on RA, normal by my interpretation.   COORDINATION OF CARE: 12:41 AM-Discussed next steps with pt. Pt verbalized understanding and is agreeable with the plan.   Labs (all labs ordered are listed, but only abnormal results are displayed) Labs Reviewed  COMPREHENSIVE  METABOLIC PANEL - Abnormal; Notable for the following:       Result Value   Sodium 130 (*)    Potassium 3.3 (*)    Chloride 94 (*)    Glucose, Bld 120 (*)    ALT 13 (*)    All other components within normal limits  CBC - Abnormal; Notable for the following:    WBC 15.2 (*)    All other components within normal limits  URINALYSIS, ROUTINE W REFLEX MICROSCOPIC (NOT AT [facility]) - Abnormal; Notable for the following:    APPearance CLOUDY (*)    Hgb urine dipstick MODERATE (*)    Protein, ur 100 (*)    All other components within normal limits  URINE MICROSCOPIC-ADD ON - Abnormal; Notable for the following:    Squamous Epithelial / LPF 0-5 (*)    Bacteria, UA RARE (*)    Casts HYALINE CASTS (*)  All other components within normal limits  LIPASE, BLOOD   EKG  EKG Interpretation None      Radiology Ct Abdomen Pelvis W Contrast  Result Date: 06/20/2016 CLINICAL DATA:  Abdominal pain for 2 weeks after drinking 4 glasses of brown liquor. Nausea, vomiting, and severe abdominal pain. Possibly urinary tract infection. EXAM: CT ABDOMEN AND PELVIS WITH CONTRAST TECHNIQUE: Multidetector CT imaging of the abdomen and pelvis was performed using the standard protocol following bolus administration of intravenous contrast. CONTRAST:  25m ISOVUE-300 IOPAMIDOL (ISOVUE-300) INJECTION 61% COMPARISON:  11/09/2011 FINDINGS: Mild atelectasis in the lung bases. The pyloric region of the stomach appears thickened with edema around the pylorus, duodenal C-loop, and head of pancreas. Mild pancreatic ductal dilatation. Changes suggest an inflammatory process, likely peptic ulcer disease with secondary inflammation of the pancreas. No specific mass lesion is demonstrated. No focal fluid collections. The liver, spleen, adrenal glands, kidneys, abdominal aorta, inferior vena cava, and retroperitoneal lymph nodes are unremarkable. Stomach, small bowel, and colon are not abnormally distended. No free air or free fluid in  the abdomen. Pelvis: Appendix is normal. Bladder wall is mildly thickened which may indicate cystitis. No pelvic mass or lymphadenopathy. No free or loculated pelvic fluid collections. No destructive bone lesions. IMPRESSION: Inflammatory changes around the pyloric region of the stomach and proximal duodenum likely representing peptic ulcer disease. Edema extends around the head of the pancreas suggesting penetrating ulcer or secondary inflammation of the pancreas. Mild pancreatic ductal dilatation. Follow-up after resolution of acute process is suggested to exclude underlying pancreatic mass lesion. Electronically Signed   By: WLucienne CapersM.D.   On: 06/20/2016 02:39    Procedures Procedures (including critical care time)  Medications Ordered in ED Medications  famotidine (PEPCID) IVPB 20 mg premix (20 mg Intravenous New Bag/Given 06/20/16 0309)  morphine 4 MG/ML injection 4 mg (4 mg Intravenous Given 06/20/16 0055)  ondansetron (ZOFRAN) injection 4 mg (4 mg Intravenous Given 06/20/16 0055)  gi cocktail (Maalox,Lidocaine,Donnatal) (30 mLs Oral Given 06/20/16 0055)  sodium chloride 0.9 % bolus 1,000 mL (0 mLs Intravenous Stopped 06/20/16 0312)  iopamidol (ISOVUE-300) 61 % injection 80 mL (80 mLs Intravenous Contrast Given 06/20/16 0207)  sucralfate (CARAFATE) tablet 1 g (1 g Oral Given 06/20/16 0311)  morphine 4 MG/ML injection 4 mg (4 mg Intravenous Given 06/20/16 0304)    Initial Impression / Assessment and Plan / ED Course  I have reviewed the triage vital signs and the nursing notes.  Pertinent labs & imaging results that were available during my care of the patient were reviewed by me and considered in my medical decision making (see chart for details).  Clinical Course   Patient presents with epigastric abdominal pain. Ongoing. Worsened with alcohol use. Vital signs reassuring. She is uncomfortable appearing but no signs of peritonitis. Lab work notable for leukocytosis. Lipase normal.  Mild hypokalemia. Patient was given morphine, GI cocktail, Zofran. CT scan of the abdomen obtained and concerning for possible eroding peptic ulcer. She had continued pain. She was given additional doses of morphine as well as Carafate. Potassium was replaced. She is hemodynamically stable. Hemoglobin is normal. Denies bleeding. Will admit for pain control and possible GI evaluation.  Final Clinical Impressions(s) / ED Diagnoses   Final diagnoses:  PUD (peptic ulcer disease)  Epigastric pain    New Prescriptions New Prescriptions   No medications on file   I personally performed the services described in this documentation, which was scribed in my presence. The recorded information  has been reviewed and is accurate.    Merryl Hacker, MD 06/20/16 [phone]

## 2016-06-20 NOTE — ED Notes (Signed)
Attempted to call report

## 2016-06-21 ENCOUNTER — Inpatient Hospital Stay (HOSPITAL_COMMUNITY): Payer: Medicaid Other | Admitting: Anesthesiology

## 2016-06-21 ENCOUNTER — Telehealth: Payer: Self-pay

## 2016-06-21 ENCOUNTER — Encounter (HOSPITAL_COMMUNITY): Payer: Self-pay | Admitting: *Deleted

## 2016-06-21 ENCOUNTER — Encounter (HOSPITAL_COMMUNITY)
Admission: EM | Disposition: A | Discharge status: Home / Self Care | Payer: Self-pay | Source: Home / Self Care | Attending: Student in an Organized Health Care Education/Training Program

## 2016-06-21 HISTORY — PX: ESOPHAGOGASTRODUODENOSCOPY: SHX5428

## 2016-06-21 LAB — POTASSIUM: POTASSIUM: 4 mmol/L (ref 3.5–5.1)

## 2016-06-21 SURGERY — EGD (ESOPHAGOGASTRODUODENOSCOPY)
Anesthesia: Monitor Anesthesia Care

## 2016-06-21 MED ORDER — PROPOFOL 10 MG/ML IV BOLUS
INTRAVENOUS | Status: DC | PRN
  Administered 2016-06-21 (×2): 40 mg via INTRAVENOUS
  Administered 2016-06-21: 50 mg via INTRAVENOUS
  Administered 2016-06-21: 40 mg via INTRAVENOUS

## 2016-06-21 MED ORDER — MORPHINE SULFATE (PF) 2 MG/ML IV SOLN
2.0000 mg | Freq: Once | INTRAVENOUS | Status: AC
  Administered 2016-06-21: 2 mg via INTRAVENOUS
  Filled 2016-06-21: qty 1

## 2016-06-21 MED ORDER — SUCRALFATE 1 GM/10ML PO SUSP
1.0000 g | Freq: Three times a day (TID) | ORAL | Status: DC
  Administered 2016-06-21 – 2016-06-22 (×2): 1 g via ORAL
  Filled 2016-06-21 (×3): qty 10

## 2016-06-21 MED ORDER — BUTAMBEN-TETRACAINE-BENZOCAINE 2-2-14 % EX AERO
INHALATION_SPRAY | CUTANEOUS | Status: DC | PRN
  Administered 2016-06-21: 1 via TOPICAL

## 2016-06-21 MED ORDER — MIDAZOLAM HCL 5 MG/5ML IJ SOLN
INTRAMUSCULAR | Status: DC | PRN
Start: 2016-06-21 — End: 2016-06-21
  Administered 2016-06-21: 2 mg via INTRAVENOUS

## 2016-06-21 MED ORDER — LIDOCAINE HCL (CARDIAC) 20 MG/ML IV SOLN
INTRAVENOUS | Status: DC | PRN
  Administered 2016-06-21: 100 mg via INTRATRACHEAL

## 2016-06-21 MED ORDER — POTASSIUM CHLORIDE 10 MEQ/100ML IV SOLN
10.0000 meq | INTRAVENOUS | Status: AC
  Administered 2016-06-21 (×2): 10 meq via INTRAVENOUS
  Filled 2016-06-21 (×2): qty 100

## 2016-06-21 MED ORDER — POTASSIUM CHLORIDE CRYS ER 20 MEQ PO TBCR: 40.0000 meq | EXTENDED_RELEASE_TABLET | Freq: Once | ORAL | Status: DC

## 2016-06-21 MED ORDER — FENTANYL CITRATE (PF) 250 MCG/5ML IJ SOLN
INTRAMUSCULAR | Status: DC | PRN
  Administered 2016-06-21: 100 ug via INTRAVENOUS
  Administered 2016-06-21: 50 ug via INTRAVENOUS
  Administered 2016-06-21: 100 ug via INTRAVENOUS

## 2016-06-21 MED ORDER — LACTATED RINGERS IV SOLN
INTRAVENOUS | Status: DC
Start: 2016-06-21 — End: 2016-06-21
  Administered 2016-06-21: 1000 mL via INTRAVENOUS

## 2016-06-21 NOTE — Anesthesia Preprocedure Evaluation (Signed)
Anesthesia Evaluation  Patient identified by MRN, date of birth, ID band Patient awake    Airway Mallampati: II  TM Distance: >3 FB Neck ROM: Full    Dental  (+) Poor Dentition, Missing   Pulmonary neg pulmonary ROS, Current Smoker,    breath sounds clear to auscultation       Cardiovascular hypertension,  Rhythm:Regular Rate:Normal     Neuro/Psych negative neurological ROS     GI/Hepatic PUD, (+)     substance abuse  alcohol use,   Endo/Other  negative endocrine ROS  Renal/GU Renal disease     Musculoskeletal   Abdominal   Peds  Hematology negative hematology ROS (+)   Anesthesia Other Findings   Reproductive/Obstetrics                             Anesthesia Physical Anesthesia Plan  ASA: III  Anesthesia Plan: MAC   Post-op Pain Management:    Induction: Intravenous  Airway Management Planned: Natural Airway and Nasal Cannula  Additional Equipment:   Intra-op Plan:   Post-operative Plan:   Informed Consent: I have reviewed the patients History and Physical, chart, labs and discussed the procedure including the risks, benefits and alternatives for the proposed anesthesia with the patient or authorized representative who has indicated his/her understanding and acceptance.     Plan Discussed with: CRNA  Anesthesia Plan Comments:         Anesthesia Quick Evaluation

## 2016-06-21 NOTE — Telephone Encounter (Signed)
Appt made and letter mailed. 

## 2016-06-21 NOTE — Anesthesia Postprocedure Evaluation (Signed)
Anesthesia Post Note  Patient: Archer Pajares  Procedure(s) Performed: Procedure(s) (LRB): ESOPHAGOGASTRODUODENOSCOPY (EGD) (N/A)  Patient location during evaluation: Endoscopy Anesthesia Type: MAC Level of consciousness: awake and alert Pain management: pain level controlled Vital Signs Assessment: post-procedure vital signs reviewed and stable Respiratory status: spontaneous breathing, nonlabored ventilation, respiratory function stable and patient connected to nasal cannula oxygen Cardiovascular status: stable and blood pressure returned to baseline Anesthetic complications: no    Last Vitals:  Vitals:   06/21/16 1415 06/21/16 1450  BP: (!) 156/96 (!) 145/101  Pulse: (!) 59 (!) 59  Resp: 18 18  Temp:  36.6 C    Last Pain:  Vitals:   06/21/16 1450  TempSrc: Oral  PainSc:                  Khayree Delellis,JAMES TERRILL

## 2016-06-21 NOTE — Transfer of Care (Signed)
Immediate Anesthesia Transfer of Care Note  Patient: Roberta Miller  Procedure(s) Performed: Procedure(s): ESOPHAGOGASTRODUODENOSCOPY (EGD) (N/A)  Patient Location: PACU  Anesthesia Type:MAC  Level of Consciousness: awake, alert  and oriented  Airway & Oxygen Therapy: Patient Spontanous Breathing and Patient connected to nasal cannula oxygen  Post-op Assessment: Report given to RN and Post -op Vital signs reviewed and stable  Post vital signs: Reviewed and stable  Last Vitals:  Vitals:   06/21/16 1147 06/21/16 1405  BP: (!) 166/119 139/81  Pulse:  65  Resp: (!) 26 20  Temp: 36.7 C     Last Pain:  Vitals:   06/21/16 1147  TempSrc: Oral  PainSc: 10-Worst pain ever      Patients Stated Pain Goal: 0 ([id number])  Complications: No apparent anesthesia complications

## 2016-06-21 NOTE — Telephone Encounter (Signed)
-----   Message from Milus Banister, MD sent at 06/21/2016  2:07 PM EDT ----- She needs rov in 6-7 weeks, for gastric ulcer follow up.  Thanks

## 2016-06-21 NOTE — Anesthesia Procedure Notes (Signed)
Procedure Name: MAC Date/Time: 06/21/2016 1:44 PM Performed by: Mariea Clonts Pre-anesthesia Checklist: Patient identified, Emergency Drugs available, Suction available, Patient being monitored and Timeout performed Patient Re-evaluated:Patient Re-evaluated prior to inductionOxygen Delivery Method: Nasal cannula

## 2016-06-21 NOTE — Op Note (Signed)
[facility] Patient Name: Roberta Miller Procedure Date : 06/21/2016 MRN: [id number] Attending MD: Milus Banister , MD Date of Birth: [birth year] CSN: [id number] Age: 60 Admit Type: Inpatient Procedure:                Upper GI endoscopy Indications:              Epigastric abdominal pain, CT scan suggests                            abnormal distal stomach, duodenum Providers:                Milus Banister, MD, Kingsley Plan, RN, Corliss Parish, Technician Referring MD:              Medicines:                Monitored Anesthesia Care Complications:            No immediate complications. Estimated blood loss:                            None. Estimated Blood Loss:     Estimated blood loss: none. Procedure:                Pre-Anesthesia Assessment:                           - Prior to the procedure, a History and Physical                            was performed, and patient medications and                            allergies were reviewed. The patient's tolerance of                            previous anesthesia was also reviewed. The risks                            and benefits of the procedure and the sedation                            options and risks were discussed with the patient.                            All questions were answered, and informed consent                            was obtained. Prior Anticoagulants: The patient has                            taken no previous anticoagulant or antiplatelet                            agents. ASA  Grade Assessment: II - A patient with                            mild systemic disease. After reviewing the risks                            and benefits, the patient was deemed in                            satisfactory condition to undergo the procedure.                           After obtaining informed consent, the endoscope was                            passed under direct vision.  Throughout the                            procedure, the patient's blood pressure, pulse, and                            oxygen saturations were monitored continuously. The                            was introduced through the mouth, and advanced to                            the second part of duodenum. The upper GI endoscopy                            was accomplished without difficulty. The patient                            tolerated the procedure well. Scope In: Scope Out: Findings:      The esophagus was normal.      One non-bleeding cratered gastric ulcer with no stigmata of bleeding was       found at the pylorus, distorting the distal gastric anatomy. The lesion       was 10 mm in largest dimension. The surrounding mucosa was edematous,       friable, oozing to touch. This was not overtly neoplastic appearing.       Biopsies were taken with a cold forceps for histology.      Diffuse mild inflammation characterized by erythema and granularity was       found in the entire examined stomach. Biopsies were taken with a cold       forceps for histology.      The examined duodenum was normal. Impression:               - Normal esophagus.                           - Non-bleeding pyloric channel ulcer with no  stigmata of bleeding. The surrounding edematous,                            friable mucosa was biopsied.                           - Mild pan-gastritis, biospied.                           - Normal examined duodenum. Moderate Sedation:      none Recommendation:           - Return patient to hospital ward for ongoing care.                           - Advance diet as tolerated.                           - Await pathology results.                           - She should stay on twice daily PPI for at least                            the next 2-3 months, completely avoid NSAIDs. If                            biopsies show H. pylori she will started on                             appropriate antibiotics. My office will contact her                            about return office visit in 5-6 weeks and will                            schedule repeat EGD from that visit. She should                            stay on PPI twice daily until that repeat EGD.                            Please call, page with any further questions or                            concerns. She is safe for d/c tomorrow if she is                            tolerating advanced diet. Procedure Code(s):        --- Professional ---                           [phone], Esophagogastroduodenoscopy, flexible,  transoral; with biopsy, single or multiple Diagnosis Code(s):        --- Professional ---                           K25.9, Gastric ulcer, unspecified as acute or                            chronic, without hemorrhage or perforation                           K29.70, Gastritis, unspecified, without bleeding                           R10.13, Epigastric pain CPT copyright 2016 American [employer] Association. All rights reserved. The codes documented in this report are preliminary and upon coder review may  be revised to meet current compliance requirements. Milus Banister, MD 06/21/2016 2:05:35 PM This report has been signed electronically. Number of Addenda: 0

## 2016-06-21 NOTE — Progress Notes (Signed)
   Subjective: No acute events overnight. Patient states that her stomach pain is improved this morning. She denies nausea or vomiting. She is hungry and is ready to eat. She'll have EGD today with GI. She has no additional acute complaints or concerns this morning.  Objective:  Vital signs in last 24 hours: Vitals:   06/20/16 0345 06/20/16 0414 06/20/16 2106 06/21/16 0517  BP: 111/93 123/83 115/72 116/76  Pulse: 74 85 68 60  Resp:  18 16 16   Temp:  98.2 F (36.8 C) 98.4 F (36.9 C) 99.1 F (37.3 C)  TempSrc:      SpO2: 97% 98% 99% 100%  Weight:  110 lb (49.9 kg)    Height:  5\' 10"  (1.778 m)     Physical Exam  Constitutional: She is oriented to person, place, and time. She appears well-developed and well-nourished. No distress.  HENT:  Head: Normocephalic and atraumatic.  Extremely poor dentition  Cardiovascular: Normal rate and regular rhythm.  Exam reveals no gallop and no friction rub.   No murmur heard. Respiratory: Effort normal and breath sounds normal. No respiratory distress. She has no wheezes.  GI: Soft. Bowel sounds are normal. She exhibits no distension. There is no tenderness.  No tenderness to palpation in the midepigastric region or other abdominal quadrants  Musculoskeletal: She exhibits no edema.  Neurological: She is alert and oriented to person, place, and time.    Assessment/Plan:  Ms. Trefry is a 60 year old female with a history of gastroesophageal reflux disease, hypertension, pancreatitis and polysubstance abuse who presents with a two-week history of abdominal pain.  1. Abdominal Pain -- Patient endorses gradually worsening abdominal pain over the past 2 weeks. She said this pain acutely worsened over the weekend and she had 3 episodes of nonbloody nonbilious emesis. Patient with a significant alcohol abuse disorder. The differential diagnosis includes alcohol-induced gastritis, peptic ulcer disease, Mallory-Weiss tear or increase in the patient's  gastric esophageal reflux disease. -- Lipase normal, leukocytosis at 15.2 -- NPO -- Protonix 40 mg twice a day -- EGD today  2. Polysubstance abuse disorder -- Patient with a significant history of alcohol abuse disorder. She endorsed Reinking 4 cups of June on Thursday evening. She says she has not had a drink since then. -- CIWA protocol  3. Hypertension -- Takes HCTZ 25 mg once daily home. Currently normotensive -- We'll hold home antihypertensive medication  4. Hypokalemia, resolved -- Most recent potassium 4.0  Dispo: Anticipated discharge today.   Ophelia Shoulder, MD 06/21/2016, 8:55 AM Pager: [phone]

## 2016-06-22 ENCOUNTER — Encounter (HOSPITAL_COMMUNITY): Payer: Self-pay | Admitting: Gastroenterology

## 2016-06-22 DIAGNOSIS — K253 Acute gastric ulcer without hemorrhage or perforation: Principal | ICD-10-CM

## 2016-06-22 DIAGNOSIS — F1020 Alcohol dependence, uncomplicated: Secondary | ICD-10-CM

## 2016-06-22 DIAGNOSIS — I10 Essential (primary) hypertension: Secondary | ICD-10-CM

## 2016-06-22 MED ORDER — TRAMADOL HCL 50 MG PO TABS: 25.0000 mg | ORAL_TABLET | Freq: Two times a day (BID) | ORAL | 0 refills | Status: DC | PRN

## 2016-06-22 MED ORDER — FOLIC ACID 1 MG PO TABS: 1.0000 mg | ORAL_TABLET | Freq: Every day | ORAL | 0 refills | Status: DC

## 2016-06-22 MED ORDER — PANTOPRAZOLE SODIUM 40 MG PO TBEC: 40.0000 mg | DELAYED_RELEASE_TABLET | Freq: Two times a day (BID) | ORAL | 3 refills | Status: DC

## 2016-06-22 MED ORDER — SUCRALFATE 1 GM/10ML PO SUSP: 1.0000 g | Freq: Three times a day (TID) | ORAL | 0 refills | Status: DC

## 2016-06-22 NOTE — Progress Notes (Signed)
   Subjective: No acute events overnight. Patient is recovering well from her EGD yesterday. She denies nausea or vomiting or abdominal pain. She has had good by mouth intake and had 2 large meals without an increase in her pain. She has no additional acute complaints or concerns this morning. She is ready for discharge.  Objective:  Vital signs in last 24 hours: Vitals:   06/21/16 1600 06/21/16 1814 06/21/16 2102 06/22/16 0516  BP: 118/85 (!) 136/93 (!) 157/95 119/84  Pulse:  67 61 65  Resp:  16 19 18   Temp:  98.1 F (36.7 C) 97.5 F (36.4 C) 98.2 F (36.8 C)  TempSrc:  Oral Oral Oral  SpO2:  100% 100% 97%  Weight:      Height:       Physical Exam  Constitutional: She is oriented to person, place, and time. She appears well-developed and well-nourished.  In no acute distress  HENT:  Head: Normocephalic and atraumatic.  Poor oral hygiene and dentition  Cardiovascular: Normal rate and regular rhythm.  Exam reveals no gallop and no friction rub.   No murmur heard. Respiratory: Effort normal and breath sounds normal. No respiratory distress. She has no wheezes. She has no rales.  GI: Soft. Bowel sounds are normal. She exhibits no distension. There is no tenderness.  Patient has no tenderness to palpation upon examination this morning this is improved from yesterday  Musculoskeletal: She exhibits no edema.  Neurological: She is alert and oriented to person, place, and time.     Assessment/Plan:  Ms. Hohl is a 60 year old female with a history of gastroesophageal reflux disease, hypertension, pancreatitis and polysubstance abuse who presents with a two-week history of abdominal pain.  1. Abdominal Pain, secondary to pyloric channel ulcer and diffuse gastritis -- Patient had EGD yesterday which demonstrated a single gastric ulcer and diffuse gastritis -- Continue Protonix 40 mg twice a day, or whichever proton pump inhibitors on the patient's formulary -- Follow-up appointment  with gastroenterology scheduled for one 09/07/16 -- Repeat EGD for ulcer surveillance will be scheduled at this time  2. Polysubstance abuse disorder -- Patient with a significant history of alcohol abuse disorder. She endorsed drinking 4 cups of June on Thursday evening. She says she has not had a drink since then. -- CIWAprotocol -- Counseled on alcohol cessation  3. Hypertension -- Takes HCTZ25 mg once daily home. Currently normotensive -- We'll hold home antihypertensive medication    Dispo: Anticipated discharge today.   Ophelia Shoulder, MD 06/22/2016, 10:53 AM Pager: [phone]

## 2016-06-22 NOTE — Progress Notes (Signed)
Pt given discharge instructions, prescriptions, and care notes. Pt verbalized understanding AEB no further questions or concerns at this time. IV was discontinued, no redness, pain, or swelling noted at this time. Pt left the floor via wheelchair with staff in stable condition. 

## 2016-06-22 NOTE — Progress Notes (Signed)
          Daily Rounding Note  06/22/2016, 9:59 AM  LOS: 2 days   SUBJECTIVE:   Chief complaint: abdominal pain  No pain yesterday or today.  No BMs.  No N/V.  Feels well and looking forward to discharge  OBJECTIVE:         Vital signs in last 24 hours:    Temp:  [97.5 F (36.4 C)-98.2 F (36.8 C)] 98.2 F (36.8 C) (08/16 0516) Pulse Rate:  [59-67] 65 (08/16 0516) Resp:  [16-26] 18 (08/16 0516) BP: (118-166)/(81-119) 119/84 (08/16 0516) SpO2:  [97 %-100 %] 97 % (08/16 0516) Last BM Date: 06/19/16 Filed Weights   06/20/16 0012 06/20/16 0414  Weight: 54.4 kg (120 lb) 49.9 kg (110 lb)   General: pleasant, thin.   Heart: RRR Chest: clear bil.   Abdomen: soft, NT, ND.  Active BS  Extremities: no CCE Neuro/Psych:  Oriented x 3.  Alert, no weakness or gross deficits.  No tremor.   Intake/Output from previous day: 08/15 0701 - 08/16 0700 In: 360 [P.O.:360] Out: 0   Intake/Output this shift: Total I/O In: 360 [P.O.:360] Out: -   Lab Results:  Recent Labs  06/20/16 0049  WBC 15.2*  HGB 14.0  HCT 41.6  PLT 291   BMET  Recent Labs  06/20/16 0049 06/20/16 1231 06/21/16 0913  NA 130* 137  --   K 3.3* 3.3* 4.0  CL 94* 101  --   CO2 27 28  --   GLUCOSE 120* 96  --   BUN 12 9  --   CREATININE 0.86 0.90  --   CALCIUM 9.2 8.8*  --    LFT  Recent Labs  06/20/16 0049  PROT 6.9  ALBUMIN 3.9  AST 21  ALT 13*  ALKPHOS 65  BILITOT 0.6   PT/INR No results for input(s): LABPROT, INR in the last 72 hours. Hepatitis Panel No results for input(s): HEPBSAG, HCVAB, HEPAIGM, HEPBIGM in the last 72 hours.  Studies/Results: No results found.  ASSESMENT:   *  Non-bleeding pyloric channel ulcer, pan-gastritis.  EGD 8/15 for epigastric pain.  Pathology pending  *  Pancreatic ductal dilatation.  ? Due to ulcer process.  Lipase and LFTs normal.  Hx of pancreatitis in 2013.  She drinks 24 oz beer every other day,  drank spirits last week prior to onset of sxs.  Hx of pancreatitis and substance abuse  *  Fetal alcohol syndrome, cognitive impairment.  Niece is her 53 and helps her with bills.  Niece is her POA and Chauncey Reading,   Niece Jeslynn Balli is HCPOA [id number] Sister [name] [id number]   PLAN   *  09/08/15 GI visit with Dr Ardis Hughs.  At that appt, arrangements for future EGD to survey the ulcer will be arranged.   *  BID PPI, generic Omeprazole or whatever is on her drug formulary or is cheapest is fine.   *  Needs to avoid/stop ETOH.        Azucena Freed  06/22/2016, 9:59 AM Pager: [phone]

## 2016-06-22 NOTE — Discharge Summary (Signed)
Name: Roberta Miller MRN: [id number] DOB: 16-Feb-1956 60 y.o. PCP: Milagros Loll, MD  Date of Admission: 06/20/2016 12:24 AM Date of Discharge: 06/22/2016 Attending Physician: No att. providers found  Discharge Diagnosis: 1. Gastric ulcer and diffuse gastritis  Discharge Medications:   Medication List    TAKE these medications   Blood Pressure Monitor Kit To check blood pressure daily   carbamazepine 200 MG tablet Commonly known as:  TEGRETOL Take 1 tablet (200 mg total) by mouth 2 (two) times daily. 2 bid What changed:  how much to take  additional instructions   folic acid 1 MG tablet Commonly known as:  FOLVITE Take 1 tablet (1 mg total) by mouth daily.   hydrochlorothiazide 25 MG tablet Commonly known as:  HYDRODIURIL TAKE 1 TABLET (25 MG TOTAL) BY MOUTH DAILY.   hydrocortisone cream 1 % Apply to affected area 2 times daily- Right forearm.   metroNIDAZOLE 500 MG tablet Commonly known as:  FLAGYL Take two tablets by mouth twice a day, for one day.  Or you can take all four tablets at once if you can tolerate it.   pantoprazole 40 MG tablet Commonly known as:  PROTONIX Take 1 tablet (40 mg total) by mouth 2 (two) times daily before a meal. What changed:  when to take this   sucralfate 1 GM/10ML suspension Commonly known as:  CARAFATE Take 10 mLs (1 g total) by mouth 4 (four) times daily -  with meals and at bedtime.   traMADol 50 MG tablet Commonly known as:  ULTRAM Take 0.5 tablets (25 mg total) by mouth every 12 (twelve) hours as needed for moderate pain.       Disposition and follow-up:   RobertaBrogan Miller was discharged from [facility] in Good condition.  At the hospital follow up visit please address:  1.  At the patient's follow-up visit please ensure she is taking her medication as prescribed. Additionally, please ensure the patient knows when her appointment with gastroenterology is scheduled for repeat endoscopy.  2.  Labs /  imaging needed at time of follow-up: None  3.  Pending labs/ test needing follow-up:  Please follow up the results of the patient's biopsy during EGD and if H. pylori is identified please treat appropriately.  Follow-up Appointments: Follow-up Information    Internal Medicine Clinic at [facility] Follow up in 7 day(s).   Why:  You have a follow-up appointment on 06/29/2016 at 2:15 in the [facility] internal medicine clinic. Please ensure you go to this appointment to discuss how your stomach pain is doing.          Hospital Course by problem list:   1. Gastric ulcer and diffuse gastritis [name] presented to the [facility] emergency department on 06/20/2016 complaining of severe abdominal pain that had gradually worsened over the previous 2 weeks. Additionally, she endorsed several episodes of nonbloody nonbilious emesis prior to presentation in the emergency department. In the emergency department the patient had a normal lipase and a leukocytosis of 15.2. She had a CT abdomen/pelvis with contrast which demonstrated inflammatory changes around the pyloric region of the stomach and proximal duodenum likely representing peptic ulcer disease. She was started on Protonix 40 mg twice a day and gastroenterology was consult for EGD. The patient had an EGD which showed a single pyloric channel ulcer and diffuse gastritis. Biopsies were taken for pathology and to test for Helicobacter pylori infection. She was started on Protonix 40 mg  twice daily and has scheduled follow-up appointment with gastroenterology for repeat EGD. At the day of discharge the patient had good by mouth intake and had eaten 2 large meals without complaints of pain. She denied nausea, vomiting or abdominal pain. On her physical examination at time of discharge the patient was not tender to palpation in any abdominal quadrant. Additionally, the patient has follow-up with the [facility] internal medicine clinic scheduled for  06/29/2016. She was discharged in stable condition and on the appropriate medications for the treatment of her gastric ulcer diffuse gastritis.  2. Hypertension The patient has a history of hypertension which is controlled on a single medication HCTZ 25 mg once daily. She remained normotensive during her inpatient stay and we held this medication. She will resume taking her HCTZ 25 mg once daily upon discharge.  3. Polysubstance abuse disorder The patient has a heavy alcohol abuse disorder. Her last drink was approximately 3 days before she presented to the emergency department and she was out of the window for alcohol withdrawal. However, she was started on CIWA protocol. She did not have any signs or symptoms consistent with alcohol withdrawal during her inpatient stay. We encouraged her about the need for her to quit drinking and the impact drinking alcohol would have on her peptic ulcer and gastritis.  Discharge Vitals:   BP (!) 169/89   Pulse 62   Temp 97.5 F (36.4 C) (Oral)   Resp 18   Ht 5' 10"  (1.778 m)   Wt 110 lb (49.9 kg)   SpO2 100%   BMI 15.78 kg/m   Pertinent Labs, Studies, and Procedures:  CT scan EGD demonstrating single gastric ulcer and diffuse gastritis  Discharge Instructions: Discharge Instructions    Discharge instructions    Complete by:  As directed   Please continue your normal at-home medications. At this hospitalization we started a new medication called Protonix. You will need to take this medication twice daily every day until you see the gastroenterologist. You are to take this medication which is 40 mg once in the morning and once in the evening every day. Additionally, I will prescribe tramadol for pain. You may take this medication if your abdominal pain gets severe. You can take up to 2 pills a day to help with your pain.  Please avoid alcohol intake as this will worsen your ulcer, gastritis and cause increased pain. I have scheduled an appointment for  you in the internal medicine clinic at [facility] for 06/29/2016 at 2:15. Please ensure that you come to this appointment to discuss how your pain is doing. Additionally, you have an appointment scheduled with the stomach doctors. They will notify you when your appointment is and where to go. Please ensure that you follow up with this appointment. At your appointment with the stomach doctors they will schedule you an appointment for an EGD to ensure that your ulcer is healed.  Please continue to take all your medications as prescribed and if you have any questions about these medications or if your pain worsens please discuss this at your upcoming appointment in the internal medicine clinic on 06/29/2016.   Increase activity slowly    Complete by:  As directed      Signed: Ophelia Shoulder, MD 06/22/2016, 3:02 PM   Pager: [phone]

## 2016-06-24 ENCOUNTER — Telehealth: Payer: Self-pay | Admitting: Gastroenterology

## 2016-06-24 MED ORDER — BIS SUBCIT-METRONID-TETRACYC 140-125-125 MG PO CAPS: 3.0000 | ORAL_CAPSULE | Freq: Three times a day (TID) | ORAL | 0 refills | Status: DC

## 2016-06-24 NOTE — Telephone Encounter (Signed)
pylera sent to pharmacy, previsit, EGD scheduled and caregiver given the information.  Pt caregiver also told to have pt take omeprazole twice daily as directed, she verbalized understanding of the instructions.

## 2016-06-29 ENCOUNTER — Encounter: Payer: Self-pay | Admitting: Pulmonary Disease

## 2016-06-29 ENCOUNTER — Ambulatory Visit: Payer: Self-pay

## 2016-08-02 ENCOUNTER — Other Ambulatory Visit: Payer: Self-pay | Admitting: *Deleted

## 2016-08-02 MED ORDER — FOLIC ACID 1 MG PO TABS: 1.0000 mg | ORAL_TABLET | Freq: Every day | ORAL | 11 refills | Status: DC

## 2016-08-05 ENCOUNTER — Other Ambulatory Visit: Payer: Self-pay | Admitting: *Deleted

## 2016-08-05 DIAGNOSIS — A599 Trichomoniasis, unspecified: Secondary | ICD-10-CM

## 2016-08-05 MED ORDER — HYDROCHLOROTHIAZIDE 25 MG PO TABS: ORAL_TABLET | ORAL | 1 refills | Status: DC

## 2016-08-05 NOTE — Telephone Encounter (Signed)
Please schedule an appt per dr Daryll Drown w/ new pcp

## 2016-08-05 NOTE — Telephone Encounter (Signed)
Needs appt with new PCP

## 2016-08-08 ENCOUNTER — Encounter: Payer: Self-pay | Admitting: Gastroenterology

## 2016-08-12 ENCOUNTER — Encounter: Payer: Self-pay | Admitting: Gastroenterology

## 2016-08-18 ENCOUNTER — Encounter: Payer: Self-pay | Admitting: Pulmonary Disease

## 2016-08-26 ENCOUNTER — Ambulatory Visit (AMBULATORY_SURGERY_CENTER): Payer: Self-pay | Admitting: *Deleted

## 2016-08-26 VITALS — Ht 64.0 in | Wt 113.2 lb

## 2016-08-26 DIAGNOSIS — K279 Peptic ulcer, site unspecified, unspecified as acute or chronic, without hemorrhage or perforation: Secondary | ICD-10-CM

## 2016-08-26 NOTE — Progress Notes (Signed)
No allergies to eggs or soy. No problems with anesthesia.  Pt given Emmi instructions for egd  No oxygen use  No diet drug use  

## 2016-08-31 ENCOUNTER — Ambulatory Visit (INDEPENDENT_AMBULATORY_CARE_PROVIDER_SITE_OTHER): Payer: Medicaid Other | Admitting: Psychiatry

## 2016-08-31 ENCOUNTER — Encounter (HOSPITAL_COMMUNITY): Payer: Self-pay | Admitting: Psychiatry

## 2016-08-31 VITALS — BP 112/68 | HR 80 | Ht 64.0 in | Wt 113.4 lb

## 2016-08-31 DIAGNOSIS — Z8041 Family history of malignant neoplasm of ovary: Secondary | ICD-10-CM

## 2016-08-31 DIAGNOSIS — F3130 Bipolar disorder, current episode depressed, mild or moderate severity, unspecified: Secondary | ICD-10-CM | POA: Diagnosis not present

## 2016-08-31 DIAGNOSIS — Z79899 Other long term (current) drug therapy: Secondary | ICD-10-CM | POA: Diagnosis not present

## 2016-08-31 DIAGNOSIS — Z811 Family history of alcohol abuse and dependence: Secondary | ICD-10-CM

## 2016-08-31 DIAGNOSIS — Z8249 Family history of ischemic heart disease and other diseases of the circulatory system: Secondary | ICD-10-CM

## 2016-08-31 DIAGNOSIS — Z803 Family history of malignant neoplasm of breast: Secondary | ICD-10-CM

## 2016-08-31 DIAGNOSIS — F17210 Nicotine dependence, cigarettes, uncomplicated: Secondary | ICD-10-CM

## 2016-08-31 DIAGNOSIS — Z833 Family history of diabetes mellitus: Secondary | ICD-10-CM

## 2016-08-31 MED ORDER — CARBAMAZEPINE 200 MG PO TABS: 200.0000 mg | ORAL_TABLET | Freq: Two times a day (BID) | ORAL | 5 refills | Status: DC

## 2016-08-31 NOTE — Progress Notes (Signed)
Patient ID: Roberta Miller, female   DOB: 01/03/1956, 60 y.o.   MRN: [id number] [facility] MD Progress Note  08/31/2016 1:47 PM [name]  MRN:  [id number] Subjective:  Happy Principal Problem: Bipolar disorder, most recent episode mania Diagnosis:  Bipolar disorder most recent episode mania Today the patient was on time. She was seen with her family member name [name]. At this time the patient is doing well. Since I saw her however she apparently had a GI bleed related to some alcohol. She admitted that she drank 4 glasses of liquor and her stomach was very upset and she ended up being admitted to the local hospital. The patient acknowledges this is a problem and will stop drinking any alcohol. Family members believes that sill stop drinking. She usually does not drink. She does not use any illicit drugs. The patient denies being psychotic at this time she is no auditory or visual hallucinations. Her mood is actually good. She's not had any explosions or states of irritability. She has somewhat of a conflict with a female friend but it's appropriate and measured. Patient is not been well. The patient is not suicidal. The patient is going to have another endoscopy in a week to follow-up  Her distress. I suspect she had gastritis. At this time psychiatrically the patient is doing well. Patient Active Problem List   Diagnosis Date Noted  . Acute peptic ulcer without hemorrhage or perforation [K27.3] 06/20/2016  . PUD (peptic ulcer disease) [K27.9] 06/20/2016  . Rash [R21] 07/27/2015  . Routine health maintenance [Z00.00] 04/09/2015  . Abnormality of gait [R26.9] 02/13/2013  . Right ovarian cyst--needs follow up u/s [N83.201] 01/14/2013  . Fracture of left knee region [IMO0002] 01/14/2013  . Polysubstance abuse: alcohol and tobacco and cocaine [F19.10] 01/09/2013  . Left upper arm pain [M79.622] 01/09/2013  . 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 Pancreatitis [K85.90]  11/09/2011  . Nephrolithiasis [N20.0] 11/09/2011  . HTN (hypertension) [I10] 11/09/2011  . UNSPECIFIED SYPHILIS [A53.9] 02/14/2010  . Microscopic hematuria [R31.29] 12/24/2009  . 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 Allergy   . Anxiety   . Fetal alcohol syndrome 1959  . GERD (gastroesophageal reflux disease)   . Hypertension   . Pancreatitis   . Substance abuse    alcohol and tobacco  . Ulcer ([place])     Past Surgical History:  Procedure Laterality Date  . ESOPHAGOGASTRODUODENOSCOPY N/A 06/21/2016   Procedure: ESOPHAGOGASTRODUODENOSCOPY (EGD);  Surgeon: Milus Banister, MD;  Location: [facility];  Service: Endoscopy;  Laterality: N/A;  . NO PAST SURGERIES     Family History:  Family History  Problem Relation Age of Onset  . Cancer Mother     died of breast cancer  . Alcoholism Mother   . Breast cancer Mother   . Hypertension Sister   . Breast cancer Sister   . Hypertension Brother   . Diabetes Brother   . Cancer - Ovarian Sister   . Colon cancer Neg Hx    Family Psychiatric  History:  Social History:  History  Alcohol Use  . 0.6 oz/week  . 1 Standard drinks or equivalent per week    Comment: Drinks beer. 40oz every other day     History  Drug Use No    Comment: hx of cocaine use    Social History  Social History  . Marital status: Single    Spouse name: N/A  . Number of children: N/A  . Years of education: N/A   Social History Main Topics  . Smoking status: Current Every Day Smoker    Packs/day: 0.50    Types: Cigarettes  . Smokeless tobacco: Never Used     Comment: 1/2ppd   . Alcohol use 0.6 oz/week    1 Standard drinks or equivalent per week     Comment: Drinks beer. 40oz every other day  . Drug use: No     Comment: hx of cocaine use  . Sexual activity: No   Other Topics Concern  . None    Social History Narrative   Lives alone   Niece power of attorney   Gets SSI   Single         Additional Social History:                         Sleep: Good  Appetite:  Good  Current Medications: Current Outpatient Prescriptions  Medication Sig Dispense Refill  . Blood Pressure Monitor KIT To check blood pressure daily 1 each 0  . carbamazepine (TEGRETOL) 200 MG tablet Take 1 tablet (200 mg total) by mouth 2 (two) times daily. 2 bid 707 tablet 5  . folic acid (FOLVITE) 1 MG tablet Take 1 tablet (1 mg total) by mouth daily. 30 tablet 11  . hydrochlorothiazide (HYDRODIURIL) 25 MG tablet TAKE 1 TABLET (25 MG TOTAL) BY MOUTH DAILY. 90 tablet 1  . pantoprazole (PROTONIX) 40 MG tablet Take 1 tablet (40 mg total) by mouth 2 (two) times daily before a meal. 60 tablet 3  . sucralfate (CARAFATE) 1 GM/10ML suspension Take 10 mLs (1 g total) by mouth 4 (four) times daily -  with meals and at bedtime. (Patient not taking: Reported on 08/26/2016) 420 mL 0  . traMADol (ULTRAM) 50 MG tablet Take 0.5 tablets (25 mg total) by mouth every 12 (twelve) hours as needed for moderate pain. 30 tablet 0   No current facility-administered medications for this visit.     Lab Results: No results found for this or any previous visit (from the past 48 hour(s)).  Physical Findings: AIMS:  , ,  ,  ,    CIWA:    COWS:     Musculoskeletal: Strength & Muscle Tone: within normal limits Gait & Station: normal Patient leans: N/A  Psychiatric Specialty Exam: ROS [facility] MD Progress Note  08/31/2016 1:47 PM [name]  MRN:  [id number] Subjective:  Happy Principal Problem: Bipolar disorder, most recent episode mania Diagnosis:  Bipolar disorder most recent episode mania Today the patient is seen with an elderly family member. She's also seen with her granddaughter is 108 years old. Patient is doing very well. She 1 isolated episode where she snapped but resolved quickly. Patient has not been  violent. The patient rarely has periods of agitation. For the most part she tolerates the world fairly well. She sleeping and eating well. She uses no alcohol uses no drugs. She stays active. The patient on the other hand likes living alone. People easily get on her and she's got the insight to understand. Interestingly the patient does not see a therapist. Generally she takes her medicine routinely. Only rarely does she misses it. The patient has no nausea or vomiting or any GI complaints. She has no psychotic symptoms. Her anxiety is well-controlled. Most importantly her irritability is minimized  with the use of Tegretol. She is not oversedated and has not had any falls. She is no neurological complaints. Patient Active Problem List   Diagnosis Date Noted  . Acute peptic ulcer without hemorrhage or perforation [K27.3] 06/20/2016  . PUD (peptic ulcer disease) [K27.9] 06/20/2016  . Rash [R21] 07/27/2015  . Routine health maintenance [Z00.00] 04/09/2015  . Abnormality of gait [R26.9] 02/13/2013  . Right ovarian cyst--needs follow up u/s [N83.201] 01/14/2013  . Fracture of left knee region [IMO0002] 01/14/2013  . Polysubstance abuse: alcohol and tobacco and cocaine [F19.10] 01/09/2013  . Left upper arm pain [M79.622] 01/09/2013  . 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 Pancreatitis [K85.90] 11/09/2011  . Nephrolithiasis [N20.0] 11/09/2011  . HTN (hypertension) [I10] 11/09/2011  . UNSPECIFIED SYPHILIS [A53.9] 02/14/2010  . Microscopic hematuria [R31.29] 12/24/2009  . 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 Allergy   . Anxiety   . Fetal alcohol syndrome 1959  . GERD (gastroesophageal reflux disease)   . Hypertension   . Pancreatitis   . Substance abuse    alcohol and tobacco  . Ulcer ([place])     Past Surgical  History:  Procedure Laterality Date  . ESOPHAGOGASTRODUODENOSCOPY N/A 06/21/2016   Procedure: ESOPHAGOGASTRODUODENOSCOPY (EGD);  Surgeon: Milus Banister, MD;  Location: [place];  Service: Endoscopy;  Laterality: N/A;  . NO PAST SURGERIES     Family History:  Family History  Problem Relation Age of Onset  . Cancer Mother     died of breast cancer  . Alcoholism Mother   . Breast cancer Mother   . Hypertension Sister   . Breast cancer Sister   . Hypertension Brother   . Diabetes Brother   . Cancer - Ovarian Sister   . Colon cancer Neg Hx    Family Psychiatric  History:  Social History:  History  Alcohol Use  . 0.6 oz/week  . 1 Standard drinks or equivalent per week    Comment: Drinks beer. 40oz every other day     History  Drug Use No    Comment: hx of cocaine use    Social History   Social History  . Marital status: Single    Spouse name: N/A  . Number of children: N/A  . Years of education: N/A   Social History Main Topics  . Smoking status: Current Every Day Smoker    Packs/day: 0.50    Types: Cigarettes  . Smokeless tobacco: Never Used     Comment: 1/2ppd   . Alcohol use 0.6 oz/week    1 Standard drinks or equivalent per week     Comment: Drinks beer. 40oz every other day  . Drug use: No     Comment: hx of cocaine use  . Sexual activity: No   Other Topics Concern  . None   Social History Narrative   Lives alone   Niece power of attorney   Gets SSI   Single         Additional Social History:                         Sleep: Good  Appetite:  Good  Current Medications: Current Outpatient Prescriptions  Medication Sig Dispense Refill  . Blood Pressure Monitor KIT To check blood pressure daily 1 each 0  .  carbamazepine (TEGRETOL) 200 MG tablet Take 1 tablet (200 mg total) by mouth 2 (two) times daily. 2 bid 762 tablet 5  . folic acid (FOLVITE) 1 MG tablet Take 1 tablet (1 mg total) by mouth daily. 30 tablet 11  .  hydrochlorothiazide (HYDRODIURIL) 25 MG tablet TAKE 1 TABLET (25 MG TOTAL) BY MOUTH DAILY. 90 tablet 1  . pantoprazole (PROTONIX) 40 MG tablet Take 1 tablet (40 mg total) by mouth 2 (two) times daily before a meal. 60 tablet 3  . sucralfate (CARAFATE) 1 GM/10ML suspension Take 10 mLs (1 g total) by mouth 4 (four) times daily -  with meals and at bedtime. (Patient not taking: Reported on 08/26/2016) 420 mL 0  . traMADol (ULTRAM) 50 MG tablet Take 0.5 tablets (25 mg total) by mouth every 12 (twelve) hours as needed for moderate pain. 30 tablet 0   No current facility-administered medications for this visit.     Lab Results: No results found for this or any previous visit (from the past 48 hour(s)).  Physical Findings: AIMS:  , ,  ,  ,    CIWA:    COWS:     Musculoskeletal: Strength & Muscle Tone: within normal limits Gait & Station: normal Patient leans: N/A  Psychiatric Specialty Exam: ROS  Blood pressure 112/68, pulse 80, height 5' 4"  (1.626 m), weight 113 lb 6.4 oz (51.4 kg).Body mass index is 19.47 kg/m.  General Appearance: Casual  Eye Contact::  Good  Speech:  Clear and Coherent  Volume:  Normal  Mood:  Euthymic  Affect:  Appropriate  Thought Process:  Goal Directed  Orientation:  Full (Time, Place, and Person)  Thought Content:  WDL  Suicidal Thoughts:  No  Homicidal Thoughts:  No  Memory:  NA  Judgement:  Good  Insight:  Good  Psychomotor Activity:  Normal  Concentration:  Good  Recall:  Good  Fund of Knowledge:Good  Language: Good  Akathisia:  No  Handed:  Right  AIMS (if indicated):     Assets:  Desire for Improvement  ADL's:  Intact  Cognition: WNL  Sleep:      Treatment Plan Summary:at this time the patient will continue taking her Tegretol 200 mg 2 in the morning and 2 at night. Unfortunately they did not have time to get a blood level that they'll get in the next day or 2. The patient denies any nausea or vomiting. She denies any evidence suggestive of  Tegretol toxicity. She's had no falls no visual problems. I think the patient is doing well. The goal Tegretol was to reduce irritability and outbursts think that's what is doing. The patient is clearly given information about the dangers of drinking alcohol but rarely taking Tegretol. The patient agreed to discontinue any alcohol use. 08/31/2016, 1:47 PM   Blood pressure 112/68, pulse 80, height 5' 4"  (1.626 m), weight 113 lb 6.4 oz (51.4 kg).Body mass index is 19.47 kg/m.  General Appearance: Casual  Eye Contact::  Good  Speech:  Clear and Coherent  Volume:  Normal  Mood:  Euthymic  Affect:  Appropriate  Thought Process:  Goal Directed  Orientation:  Full (Time, Place, and Person)  Thought Content:  WDL  Suicidal Thoughts:  No  Homicidal Thoughts:  No  Memory:  NA  Judgement:  Good  Insight:  Good  Psychomotor Activity:  Normal  Concentration:  Good  Recall:  Good  Fund of Knowledge:Good  Language: Good  Akathisia:  No  Handed:  Right  AIMS (if indicated):     Assets:  Desire for Improvement  ADL's:  Intact  Cognition: WNL  Sleep:      Treatment Plan Summary:At this time the patient is doing very well. She goes to the outpatient clinic at [facility] for her general medicine. She doing very well. She is very medically stable. She denies any chest pain or shortness of breath. Her mood is stable and even. She demonstrates no evidence of psychosis today the patient continue taking her Tegretol 200 mg 2 in the morning and 2 at night. The patient will go ahead and get a Tegretol blood level and a conference a metabolic panel next week or 2. The patient to return to see me in 4 months. The patient is not suicidal nor she homicidal. She demonstrates no neurological symptoms at this time. She is functioning extremely well. 08/31/2016, 1:47 PM

## 2016-09-06 ENCOUNTER — Encounter: Payer: Self-pay | Admitting: Gastroenterology

## 2016-09-06 ENCOUNTER — Ambulatory Visit (AMBULATORY_SURGERY_CENTER): Payer: Medicaid Other | Admitting: Gastroenterology

## 2016-09-06 VITALS — BP 123/88 | HR 58 | Temp 97.5°F | Resp 17 | Ht 64.0 in | Wt 107.0 lb

## 2016-09-06 DIAGNOSIS — K2970 Gastritis, unspecified, without bleeding: Secondary | ICD-10-CM

## 2016-09-06 DIAGNOSIS — K2950 Unspecified chronic gastritis without bleeding: Secondary | ICD-10-CM | POA: Diagnosis not present

## 2016-09-06 DIAGNOSIS — K2990 Gastroduodenitis, unspecified, without bleeding: Secondary | ICD-10-CM | POA: Diagnosis not present

## 2016-09-06 DIAGNOSIS — B9681 Helicobacter pylori [H. pylori] as the cause of diseases classified elsewhere: Secondary | ICD-10-CM | POA: Diagnosis not present

## 2016-09-06 DIAGNOSIS — K279 Peptic ulcer, site unspecified, unspecified as acute or chronic, without hemorrhage or perforation: Secondary | ICD-10-CM | POA: Diagnosis not present

## 2016-09-06 MED ORDER — SODIUM CHLORIDE 0.9 % IV SOLN: 500.0000 mL | INTRAVENOUS | Status: DC

## 2016-09-06 MED ORDER — PANTOPRAZOLE SODIUM 40 MG PO TBEC: 40.0000 mg | DELAYED_RELEASE_TABLET | Freq: Every day | ORAL | 3 refills | Status: DC

## 2016-09-06 NOTE — Patient Instructions (Signed)
YOU HAD AN ENDOSCOPIC PROCEDURE TODAY AT THE [facility]:   Refer to the procedure report that was given to you for any specific questions about what was found during the examination.  If the procedure report does not answer your questions, please call your gastroenterologist to clarify.  If you requested that your care partner not be given the details of your procedure findings, then the procedure report has been included in a sealed envelope for you to review at your convenience later.  YOU SHOULD EXPECT: Some feelings of bloating in the abdomen. Passage of more gas than usual.  Walking can help get rid of the air that was put into your GI tract during the procedure and reduce the bloating. If you had a lower endoscopy (such as a colonoscopy or flexible sigmoidoscopy) you may notice spotting of blood in your stool or on the toilet paper. If you underwent a bowel prep for your procedure, you may not have a normal bowel movement for a few days.  Please Note:  You might notice some irritation and congestion in your nose or some drainage.  This is from the oxygen used during your procedure.  There is no need for concern and it should clear up in a day or so.  SYMPTOMS TO REPORT IMMEDIATELY:     Following upper endoscopy (EGD)  Vomiting of blood or coffee ground material  New chest pain or pain under the shoulder blades  Painful or persistently difficult swallowing  New shortness of breath  Fever of 100F or higher  Black, tarry-looking stools  For urgent or emergent issues, a gastroenterologist can be reached at any hour by calling [phone].   DIET:  We do recommend a small meal at first, but then you may proceed to your regular diet.  Drink plenty of fluids but you should avoid alcoholic beverages for 24 hours.  ACTIVITY:  You should plan to take it easy for the rest of today and you should NOT DRIVE or use heavy machinery until tomorrow (because of the sedation medicines  used during the test).    FOLLOW UP: Our staff will call the number listed on your records the next business day following your procedure to check on you and address any questions or concerns that you may have regarding the information given to you following your procedure. If we do not reach you, we will leave a message.  However, if you are feeling well and you are not experiencing any problems, there is no need to return our call.  We will assume that you have returned to your regular daily activities without incident.  If any biopsies were taken you will be contacted by phone or by letter within the next 1-3 weeks.  Please call us at [phone] if you have not heard about the biopsies in 3 weeks.    SIGNATURES/CONFIDENTIALITY: You and/or your care partner have signed paperwork which will be entered into your electronic medical record.  These signatures attest to the fact that that the information above on your After Visit Summary has been reviewed and is understood.  Full responsibility of the confidentiality of this discharge information lies with you and/or your care-partner.   Resume medications. Information given on gastritis. 

## 2016-09-06 NOTE — Op Note (Signed)
[facility] Patient Name: Roberta Miller Procedure Date: 09/06/2016 9:13 AM MRN: [id number] Endoscopist: Milus Banister , MD Age: 60 Referring MD:  Date of Birth: 19-Nov-1955 Gender: Female Account #: [id number] Procedure:                Upper GI endoscopy Indications:              Follow-up of ulcer of the GI tract (06/2016 pyloric                            channel ulcer, H. pylori +; treated with 14 day                            pylera course) Medicines:                Monitored Anesthesia Care Procedure:                Pre-Anesthesia Assessment:                           - Prior to the procedure, a History and Physical                            was performed, and patient medications and                            allergies were reviewed. The patient's tolerance of                            previous anesthesia was also reviewed. The risks                            and benefits of the procedure and the sedation                            options and risks were discussed with the patient.                            All questions were answered, and informed consent                            was obtained. Prior Anticoagulants: The patient has                            taken no previous anticoagulant or antiplatelet                            agents. ASA Grade Assessment: II - A patient with                            mild systemic disease. After reviewing the risks                            and benefits, the patient was deemed in  satisfactory condition to undergo the procedure.                           After obtaining informed consent, the endoscope was                            passed under direct vision. Throughout the                            procedure, the patient's blood pressure, pulse, and                            oxygen saturations were monitored continuously. The                            Model [id number]) scope was  introduced                            through the mouth, and advanced to the second part                            of duodenum. The upper GI endoscopy was                            accomplished without difficulty. The patient                            tolerated the procedure well. Scope In: Scope Out: Findings:                 The esophagus was normal.                           The previously noted pyloric channel ulcer has                            healed. There was mild inflammation characterized                            by erythema and granularity was found in the                            gastric antrum. Biopsies were taken with a cold                            forceps for histology.                           The examined duodenum was normal. Complications:            No immediate complications. Estimated blood loss:                            None. Estimated Blood Loss:     Estimated blood loss: none. Impression:               -  Normal esophagus.                           - Previous gastric ulcer has healed.                           - Gastritis. Biopsied (to check for residual H.                            pylori)                           - Normal examined duodenum. Recommendation:           - Patient has a contact number available for                            emergencies. The signs and symptoms of potential                            delayed complications were discussed with the                            patient. Return to normal activities tomorrow.                            Written discharge instructions were provided to the                            patient.                           - Resume previous diet.                           - Continue present medications. OK to decrease your                            protonix to once daily for now.                           - Await pathology results. Milus Banister, MD 09/06/2016 9:27:32 AM This report has been  signed electronically.

## 2016-09-06 NOTE — Progress Notes (Signed)
Report to PACU, RN, vss, BBS= Clear.  

## 2016-09-07 ENCOUNTER — Ambulatory Visit: Payer: Self-pay | Admitting: Gastroenterology

## 2016-09-07 ENCOUNTER — Telehealth: Payer: Self-pay

## 2016-09-07 NOTE — Telephone Encounter (Signed)
  Follow up Call-  Call back number 09/06/2016  Post procedure Call Back phone  # [phone]  Permission to leave phone message Yes  Some recent data might be hidden     Patient questions:  Do you have a fever, pain , or abdominal swelling? No. Pain Score  0 *  Have you tolerated food without any problems? Yes.    Have you been able to return to your normal activities? Yes.    Do you have any questions about your discharge instructions: Diet   No. Medications  No. Follow up visit  No.  Do you have questions or concerns about your Care? No.  Actions: * If pain score is 4 or above: No action needed, pain <4.

## 2016-09-12 ENCOUNTER — Other Ambulatory Visit: Payer: Self-pay

## 2016-09-12 MED ORDER — BIS SUBCIT-METRONID-TETRACYC 140-125-125 MG PO CAPS: 3.0000 | ORAL_CAPSULE | Freq: Three times a day (TID) | ORAL | 0 refills | Status: DC

## 2016-12-14 ENCOUNTER — Other Ambulatory Visit (HOSPITAL_COMMUNITY)
Admission: RE | Admit: 2016-12-14 | Discharge: 2016-12-14 | Disposition: A | Discharge status: Home / Self Care | Payer: Medicaid Other | Source: Ambulatory Visit | Attending: Internal Medicine | Admitting: Internal Medicine

## 2016-12-14 ENCOUNTER — Encounter (INDEPENDENT_AMBULATORY_CARE_PROVIDER_SITE_OTHER): Payer: Self-pay

## 2016-12-14 ENCOUNTER — Ambulatory Visit (INDEPENDENT_AMBULATORY_CARE_PROVIDER_SITE_OTHER): Payer: Medicaid Other | Admitting: Pulmonary Disease

## 2016-12-14 VITALS — BP 127/89 | HR 62 | Temp 98.2°F | Wt 121.2 lb

## 2016-12-14 DIAGNOSIS — Z0000 Encounter for general adult medical examination without abnormal findings: Secondary | ICD-10-CM

## 2016-12-14 DIAGNOSIS — Z1151 Encounter for screening for human papillomavirus (HPV): Secondary | ICD-10-CM | POA: Diagnosis present

## 2016-12-14 DIAGNOSIS — Z01419 Encounter for gynecological examination (general) (routine) without abnormal findings: Secondary | ICD-10-CM

## 2016-12-14 DIAGNOSIS — Z124 Encounter for screening for malignant neoplasm of cervix: Secondary | ICD-10-CM | POA: Insufficient documentation

## 2016-12-14 DIAGNOSIS — F17210 Nicotine dependence, cigarettes, uncomplicated: Secondary | ICD-10-CM

## 2016-12-14 DIAGNOSIS — Z79899 Other long term (current) drug therapy: Secondary | ICD-10-CM

## 2016-12-14 DIAGNOSIS — I10 Essential (primary) hypertension: Secondary | ICD-10-CM | POA: Diagnosis not present

## 2016-12-14 NOTE — Assessment & Plan Note (Signed)
Pap smear done 

## 2016-12-14 NOTE — Progress Notes (Signed)
   CC: pap smear  HPI:  Ms.Chee Suguitan is a 61 y.o. woman with history as noted below here for pap smear.  Past Medical History:  Diagnosis Date  . Allergy   . Anxiety   . Fetal alcohol syndrome 1959  . GERD (gastroesophageal reflux disease)   . Hypertension   . Pancreatitis   . Substance abuse    alcohol and tobacco  . Ulcer ([place])     Review of Systems:   No fevers or chills No vaginal discharge  Physical Exam:  Vitals:   12/14/16 1014 12/14/16 1049  BP: (!) 150/89 127/89  Pulse: 71 62  Temp: 98.2 F (36.8 C)   TempSrc: Oral   SpO2: 97%   Weight: 121 lb 3.2 oz (55 kg)    General Apperance: NAD HEENT: Normocephalic, atraumatic, anicteric sclera Neck: Supple, trachea midline Lungs: Clear to auscultation bilaterally. No wheezes, rhonchi or rales. Breathing comfortably Heart: Regular rate and rhythm, no murmur/rub/gallop Abdomen: Soft, nontender, nondistended, no rebound/guarding Extremities: Warm and well perfused, no edema GU: No obvious lesions, scant white discharge Skin: No rashes or lesions Neurologic: Alert and interactive. No gross deficits.  Assessment & Plan:   See Encounters Tab for problem based charting.  Patient discussed with Dr. Dareen Piano

## 2016-12-14 NOTE — Assessment & Plan Note (Signed)
Assessment: BP initially 150/89 and 127/89 on repeat.  Plan: Continue HCTZ 25mg  daily

## 2016-12-14 NOTE — Patient Instructions (Signed)
Please follow up in the next 3-4 weeks for your regular check up

## 2016-12-16 LAB — CYTOLOGY - PAP
Diagnosis: NEGATIVE
HPV: NOT DETECTED

## 2016-12-21 NOTE — Progress Notes (Signed)
Internal Medicine Clinic Attending  Case discussed with Dr. Krall at the time of the visit.  We reviewed the resident's history and exam and pertinent patient test results.  I agree with the assessment, diagnosis, and plan of care documented in the resident's note.  

## 2017-01-05 ENCOUNTER — Ambulatory Visit (INDEPENDENT_AMBULATORY_CARE_PROVIDER_SITE_OTHER): Payer: Medicaid Other | Admitting: Pulmonary Disease

## 2017-01-05 ENCOUNTER — Encounter: Payer: Self-pay | Admitting: Pulmonary Disease

## 2017-01-05 VITALS — BP 112/80 | HR 66 | Temp 97.5°F | Ht 64.0 in | Wt 116.0 lb

## 2017-01-05 DIAGNOSIS — R05 Cough: Secondary | ICD-10-CM

## 2017-01-05 DIAGNOSIS — I10 Essential (primary) hypertension: Secondary | ICD-10-CM | POA: Diagnosis not present

## 2017-01-05 DIAGNOSIS — Z23 Encounter for immunization: Secondary | ICD-10-CM

## 2017-01-05 DIAGNOSIS — Z79899 Other long term (current) drug therapy: Secondary | ICD-10-CM | POA: Diagnosis not present

## 2017-01-05 DIAGNOSIS — J3489 Other specified disorders of nose and nasal sinuses: Secondary | ICD-10-CM | POA: Diagnosis not present

## 2017-01-05 DIAGNOSIS — Z8619 Personal history of other infectious and parasitic diseases: Secondary | ICD-10-CM | POA: Diagnosis not present

## 2017-01-05 DIAGNOSIS — R933 Abnormal findings on diagnostic imaging of other parts of digestive tract: Secondary | ICD-10-CM | POA: Diagnosis not present

## 2017-01-05 DIAGNOSIS — F17210 Nicotine dependence, cigarettes, uncomplicated: Secondary | ICD-10-CM

## 2017-01-05 DIAGNOSIS — F17200 Nicotine dependence, unspecified, uncomplicated: Secondary | ICD-10-CM

## 2017-01-05 DIAGNOSIS — K279 Peptic ulcer, site unspecified, unspecified as acute or chronic, without hemorrhage or perforation: Secondary | ICD-10-CM

## 2017-01-05 DIAGNOSIS — Z0000 Encounter for general adult medical examination without abnormal findings: Secondary | ICD-10-CM

## 2017-01-05 DIAGNOSIS — J069 Acute upper respiratory infection, unspecified: Secondary | ICD-10-CM

## 2017-01-05 MED ORDER — DEXTROMETHORPHAN POLISTIREX ER 30 MG/5ML PO SUER: 30.0000 mg | Freq: Two times a day (BID) | ORAL | 0 refills | Status: DC | PRN

## 2017-01-05 MED ORDER — NICOTINE POLACRILEX 2 MG MT GUM: 2.0000 mg | CHEWING_GUM | OROMUCOSAL | 0 refills | Status: DC | PRN

## 2017-01-05 MED ORDER — FLUTICASONE PROPIONATE 50 MCG/ACT NA SUSP: 2.0000 | Freq: Every day | NASAL | 1 refills | Status: DC

## 2017-01-05 MED ORDER — NICOTINE 14 MG/24HR TD PT24: 14.0000 mg | MEDICATED_PATCH | Freq: Every day | TRANSDERMAL | 1 refills | Status: DC

## 2017-01-05 NOTE — Progress Notes (Signed)
   CC: cough  HPI:  [name] is a 61 y.o. woman with history of GERD, HTN, tobacco use, alcohol abuse, anxiety presenting with cough.  She is having cough and rhinorrhea for the last 7 days. Productive of clear mucous. She is still smoking 1/2 ppd. She also has nasal congestion and sinus congestion.    Past Medical History:  Diagnosis Date  . Allergy   . Anxiety   . Fetal alcohol syndrome 1959  . GERD (gastroesophageal reflux disease)   . Hypertension   . Pancreatitis   . Substance abuse    alcohol and tobacco  . [place]cer ([place])     Review of Systems:   No fevers or chills No chest pain No dyspnea  Physical Exam:  Vitals:   01/05/17 1551  BP: 112/80  Pulse: 66  Temp: 97.5 F (36.4 C)  TempSrc: Oral  SpO2: 99%  Weight: 116 lb (52.6 kg)  Height: 5\' 4"  (1.626 m)   General Apperance: NAD HEENT: Normocephalic, atraumatic, anicteric sclera Neck: Supple, trachea midline Lungs: Clear to auscultation bilaterally. No wheezes, rhonchi or rales. Breathing comfortably Heart: Regular rate and rhythm, no murmur/rub/gallop Abdomen: Soft, nontender, nondistended, no rebound/guarding Extremities: Warm and well perfused, no edema Skin: No rashes or lesions Neurologic: Alert and interactive. No gross deficits.  Assessment & Plan:   See Encounters Tab for problem based charting.  Patient discussed with Dr. Eppie Gibson

## 2017-01-05 NOTE — Assessment & Plan Note (Signed)
TDAP and flu vaccines administered

## 2017-01-05 NOTE — Progress Notes (Signed)
Case discussed with Dr. Krall soon after the resident saw the patient.  We reviewed the resident's history and exam and pertinent patient test results.  I agree with the assessment, diagnosis, and plan of care documented in the resident's note. 

## 2017-01-05 NOTE — Assessment & Plan Note (Signed)
Using 1/2 ppd. Ready to quit. Will start patches at 14mg  daily for 6 weeks. Nicotine gum 2mg  prn cravings. Follow up in 6 weeks. Discussed behavioral changes.

## 2017-01-05 NOTE — Assessment & Plan Note (Signed)
Assessment: Cough and rhinorrhea for the past 7 days. Mucous clear. Probable viral URI.  Plan: Flonase 2 sprays day for nasal/sinus congestion Dextromethorphan for cough Continue supportive care

## 2017-01-05 NOTE — Assessment & Plan Note (Signed)
BP controlled at 112/80. May consider decreasing her dose of hydrochlorothiazide in the future. Continue current dose of 25mg  daily.

## 2017-01-05 NOTE — Patient Instructions (Addendum)
Follow up in 6 weeks   Please make an appointment to follow up with your gastroenterologist.  Use the nasal spray daily You may use the cough medication up to two times a day as needed   Use the nicotine patch daily. You may use the nicotine gum for cravings. If strong or frequent cravings are present after 1 piece of gum, may use a second piece within the hour (maximum: 24 pieces/day)   Coping with Quitting Smoking Quitting smoking is a physical and mental challenge. You will face cravings, withdrawal symptoms, and temptation. Before quitting, work with your health care provider to make a plan that can help you cope. Preparation can help you quit and keep you from giving in. How can I cope with cravings? Cravings usually last for 5-10 minutes. If you get through it, the craving will pass. Consider taking the following actions to help you cope with cravings:  Keep your mouth busy:  Chew sugar-free gum.  Suck on hard candies or a straw.  Brush your teeth.  Keep your hands and body busy:  Immediately change to a different activity when you feel a craving.  Squeeze or play with a ball.  Do an activity or a hobby, like making bead jewelry, practicing needlepoint, or working with wood.  Mix up your normal routine.  Take a short exercise break. Go for a quick walk or run up and down stairs.  Spend time in public places where smoking is not allowed.  Focus on doing something kind or helpful for someone else.  Call a friend or family member to talk during a craving.  Join a support group.  Call a quit line, such as 1-800-QUIT-NOW.  Talk with your health care provider about medicines that might help you cope with cravings and make quitting easier for you. How can I deal with withdrawal symptoms? Your body may experience negative effects as it tries to get used to not having nicotine in the system. These effects are called withdrawal symptoms. They may include:  Feeling  hungrier than normal.  Trouble concentrating.  Irritability.  Trouble sleeping.  Feeling depressed.  Restlessness and agitation.  Craving a cigarette. 1.  To manage withdrawal symptoms:  Avoid places, people, and activities that trigger your cravings.  Remember why you want to quit.  Get plenty of sleep.  Avoid coffee and other caffeinated drinks. These may worsen some of your symptoms. How can I handle social situations? Social situations can be difficult when you are quitting smoking, especially in the first few weeks. To manage this, you can:  Avoid parties, bars, and other social situations where people might be smoking.  Avoid alcohol.  Leave right away if you have the urge to smoke.  Explain to your family and friends that you are quitting smoking. Ask for understanding and support.  Plan activities with friends or family where smoking is not an option. What are some ways I can cope with stress? Wanting to smoke may cause stress, and stress can make you want to smoke. Find ways to manage your stress. Relaxation techniques can help. For example:  Breathe slowly and deeply, in through your nose and out through your mouth.  Listen to soothing, relaxing music.  Talk with a family member or friend about your stress.  Light a candle.  Soak in a bath or take a shower.  Think about a peaceful place. What are some ways I can prevent weight gain? Be aware that many people gain weight  after they quit smoking. However, not everyone does. To keep from gaining weight, have a plan in place before you quit and stick to the plan after you quit. Your plan should include:  Having healthy snacks. When you have a craving, it may help to:  Eat plain popcorn, crunchy carrots, celery, or other cut vegetables.  Chew sugar-free gum.  Changing how you eat:  Eat small portion sizes at meals.  Eat 4-6 small meals throughout the day instead of 1-2 large meals a day.  Be  mindful when you eat. Do not watch television or do other things that might distract you as you eat.  Exercising regularly:  Make time to exercise each day. If you do not have time for a long workout, do short bouts of exercise for 5-10 minutes several times a day.  Do some form of strengthening exercise, like weight lifting, and some form of aerobic exercise, like running or swimming.  Drinking plenty of water or other low-calorie or no-calorie drinks. Drink 6-8 glasses of water daily, or as much as instructed by your health care provider. Summary  Quitting smoking is a physical and mental challenge. You will face cravings, withdrawal symptoms, and temptation to smoke again. Preparation can help you as you go through these challenges.  You can cope with cravings by keeping your mouth busy (such as by chewing gum), keeping your body and hands busy, and making calls to family, friends, or a helpline for people who want to quit smoking.  You can cope with withdrawal symptoms by avoiding places where people smoke, avoiding drinks with caffeine, and getting plenty of rest.  Ask your health care provider about the different ways to prevent weight gain, avoid stress, and handle social situations. This information is not intended to replace advice given to you by your health care provider. Make sure you discuss any questions you have with your health care provider. Document Released: 10/21/2016 Document Revised: 10/21/2016 Document Reviewed: 10/21/2016 Elsevier Interactive Patient Education  2017 [employer].

## 2017-01-05 NOTE — Assessment & Plan Note (Signed)
CT abd/pelvis with inflammatory changes around the pyloric region of the stomach and proximal duodenum likely representing peptic ulcer disease. Mild pancreatic ductal dilatation. Persistent H. Pylori + 09/06/2016. Underwent another 14 day course of Pylera. Needs to follow up with GI. Discussed with family to schedule follow up. Also will need follow up imaging to make sure there is not a pancreatic lesion. Will await GI recommendations.

## 2017-01-11 ENCOUNTER — Ambulatory Visit (INDEPENDENT_AMBULATORY_CARE_PROVIDER_SITE_OTHER): Payer: Medicaid Other | Admitting: Psychiatry

## 2017-01-11 ENCOUNTER — Encounter (HOSPITAL_COMMUNITY): Payer: Self-pay | Admitting: Psychiatry

## 2017-01-11 VITALS — BP 118/80 | HR 70 | Ht 64.75 in | Wt 118.8 lb

## 2017-01-11 DIAGNOSIS — F17210 Nicotine dependence, cigarettes, uncomplicated: Secondary | ICD-10-CM | POA: Diagnosis not present

## 2017-01-11 DIAGNOSIS — Z79899 Other long term (current) drug therapy: Secondary | ICD-10-CM

## 2017-01-11 DIAGNOSIS — F319 Bipolar disorder, unspecified: Secondary | ICD-10-CM

## 2017-01-11 DIAGNOSIS — Z811 Family history of alcohol abuse and dependence: Secondary | ICD-10-CM

## 2017-01-11 DIAGNOSIS — F3162 Bipolar disorder, current episode mixed, moderate: Secondary | ICD-10-CM

## 2017-01-11 MED ORDER — CARBAMAZEPINE 200 MG PO TABS: 200.0000 mg | ORAL_TABLET | Freq: Two times a day (BID) | ORAL | 6 refills | Status: DC

## 2017-01-11 NOTE — Progress Notes (Signed)
Patient ID: Roberta Miller, female   DOB: January 02, 1956, 61 y.o.   MRN: [id number] [facility] MD Progress Note  01/11/2017 2:13 PM Roberta Miller  MRN:  [id number] Subjective:  Happy Principal Problem: Bipolar disorder, most recent episode mania Diagnosis:  Bipolar disorder most recent episode   Patient is doing well. She seen again with her sister Roselyn. The patient has cut down her drinking only one beer a day fortunately her stomach is doing well. Gastritis seems to be less so. She is on some medicines for it. The patient denies any chest pain or shortness of breath or any other neurological says the patient been on Tegretol the patient is done very well. She is much less impulsive much more responsible and her family is much more pleased with her. She's had no more aggressive or angry outbursts. She does have a boyfriend named [name] she seems to light. The patient is sleeping and eating very well. She does not drive and her pills and bills are done by her family. The patient denies being suicidal. The patient is physically doing well. She takes her medicine as prescribed. Unfortunately her family admitted that she did not get her blood drawn we'll go ahead and do that this time. The patient lives alone and is functioning very well. Patient Active Problem List   Diagnosis Date Noted  . Acute upper respiratory infection [J06.9] 01/05/2017  . PUD (peptic ulcer disease) [K27.9] 06/20/2016  . Routine health maintenance [Z00.00] 04/09/2015  . Abnormality of gait [R26.9] 02/13/2013  . Right ovarian cyst--needs follow up u/s [N83.201] 01/14/2013  . Tobacco use disorder [F17.200] 01/09/2013  . 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 Nephrolithiasis [N20.0] 11/09/2011  . HTN (hypertension) [I10] 11/09/2011  . UNSPECIFIED SYPHILIS [A53.9] 02/14/2010  . FETAL ALCOHOL SYNDROME [IMO0002] 12/24/2009   Total Time spent with patient: 30 minutes  Past Psychiatric History:   Past  Medical History:  Past Medical History:  Diagnosis Date  . Allergy   . Anxiety   . Fetal alcohol syndrome 1959  . GERD (gastroesophageal reflux disease)   . Hypertension   . Pancreatitis   . Substance abuse    alcohol and tobacco  . Ulcer ([place])     Past Surgical History:  Procedure Laterality Date  . ESOPHAGOGASTRODUODENOSCOPY N/A 06/21/2016   Procedure: ESOPHAGOGASTRODUODENOSCOPY (EGD);  Surgeon: Milus Banister, MD;  Location: [facility];  Service: Endoscopy;  Laterality: N/A;  . NO PAST SURGERIES     Family History:  Family History  Problem Relation Age of Onset  . Cancer Mother     died of breast cancer  . Alcoholism Mother   . Breast cancer Mother   . Hypertension Sister   . Breast cancer Sister   . Hypertension Brother   . Diabetes Brother   . Cancer - Ovarian Sister   . Colon cancer Neg Hx    Family Psychiatric  History:  Social History:  History  Alcohol Use  . 4.8 oz/week  . 1 Standard drinks or equivalent, 7 Cans of beer per week     History  Drug Use No    Comment: hx of cocaine use    Social History   Social History  . Marital status: Single    Spouse name: N/A  . Number of children: N/A  . Years of education: N/A   Social History Main Topics  . Smoking status: Current Every Day Smoker    Packs/day: 0.50    Years: 42.00  Types: Cigarettes  . Smokeless tobacco: Never Used     Comment: 1/2ppd   . Alcohol use 4.8 oz/week    1 Standard drinks or equivalent, 7 Cans of beer per week  . Drug use: No     Comment: hx of cocaine use  . Sexual activity: No   Other Topics Concern  . Not on file   Social History Narrative   Lives alone   Niece power of attorney   Gets SSI   Single         Additional Social History:                         Sleep: Good  Appetite:  Good  Current Medications: Current Outpatient Prescriptions  Medication Sig Dispense Refill  . carbamazepine (TEGRETOL) 200 MG tablet Take 1 tablet (200 mg  total) by mouth 2 (two) times daily. 2 bid 120 tablet 6  . dextromethorphan (DELSYM) 30 MG/5ML liquid Take 5 mLs (30 mg total) by mouth 2 (two) times daily as needed for cough. 89 mL 0  . fluticasone (FLONASE) 50 MCG/ACT nasal spray Place 2 sprays into both nostrils daily. 16 g 1  . folic acid (FOLVITE) 1 MG tablet Take 1 tablet (1 mg total) by mouth daily. 30 tablet 11  . hydrochlorothiazide (HYDRODIURIL) 25 MG tablet TAKE 1 TABLET (25 MG TOTAL) BY MOUTH DAILY. 90 tablet 1  . nicotine (NICODERM CQ - DOSED IN MG/24 HOURS) 14 mg/24hr patch Place 1 patch (14 mg total) onto the skin daily. 21 patch 1  . nicotine polacrilex (NICORETTE) 2 MG gum Take 1 each (2 mg total) by mouth as needed for smoking cessation. 100 tablet 0  . pantoprazole (PROTONIX) 40 MG tablet Take 1 tablet (40 mg total) by mouth daily. 60 tablet 3  . sucralfate (CARAFATE) 1 GM/10ML suspension Take 10 mLs (1 g total) by mouth 4 (four) times daily -  with meals and at bedtime. (Patient not taking: Reported on 09/06/2016) 420 mL 0   No current facility-administered medications for this visit.     Lab Results: No results found for this or any previous visit (from the past 48 hour(s)).  Physical Findings: AIMS:  , ,  ,  ,    CIWA:    COWS:     Musculoskeletal: Strength & Muscle Tone: within normal limits Gait & Station: normal Patient leans: N/A  Psychiatric Specialty Exam: ROS [facility] MD Progress Note  01/11/2017 2:13 PM Roberta Miller  MRN:  [id number] Subjective:  Happy Principal Problem: Bipolar disorder, most recent episode mania Diagnosis:  Bipolar disorder most recent episode mania Today the patient is seen with an elderly family member. She's also seen with her granddaughter is 62 years old. Patient is doing very well. She 1 isolated episode where she snapped but resolved quickly. Patient has not been violent. The patient rarely has periods of agitation. For the most part she tolerates the world fairly well. She sleeping  and eating well. She uses no alcohol uses no drugs. She stays active. The patient on the other hand likes living alone. People easily get on her and she's got the insight to understand. Interestingly the patient does not see a therapist. Generally she takes her medicine routinely. Only rarely does she misses it. The patient has no nausea or vomiting or any GI complaints. She has no psychotic symptoms. Her anxiety is well-controlled. Most importantly her irritability is minimized with the use of Tegretol.  She is not oversedated and has not had any falls. She is no neurological complaints. Patient Active Problem List   Diagnosis Date Noted  . Acute upper respiratory infection [J06.9] 01/05/2017  . PUD (peptic ulcer disease) [K27.9] 06/20/2016  . Routine health maintenance [Z00.00] 04/09/2015  . Abnormality of gait [R26.9] 02/13/2013  . Right ovarian cyst--needs follow up u/s [N83.201] 01/14/2013  . Tobacco use disorder [F17.200] 01/09/2013  . 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 Nephrolithiasis [N20.0] 11/09/2011  . HTN (hypertension) [I10] 11/09/2011  . UNSPECIFIED SYPHILIS [A53.9] 02/14/2010  . 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 Allergy   . Anxiety   . Fetal alcohol syndrome 1959  . GERD (gastroesophageal reflux disease)   . Hypertension   . Pancreatitis   . Substance abuse    alcohol and tobacco  . Ulcer ([place])     Past Surgical History:  Procedure Laterality Date  . ESOPHAGOGASTRODUODENOSCOPY N/A 06/21/2016   Procedure: ESOPHAGOGASTRODUODENOSCOPY (EGD);  Surgeon: Milus Banister, MD;  Location: [facility];  Service: Endoscopy;  Laterality: N/A;  . NO PAST SURGERIES     Family History:  Family History  Problem Relation Age of Onset  . Cancer Mother     died of breast cancer  . Alcoholism Mother    . Breast cancer Mother   . Hypertension Sister   . Breast cancer Sister   . Hypertension Brother   . Diabetes Brother   . Cancer - Ovarian Sister   . Colon cancer Neg Hx    Family Psychiatric  History:  Social History:  History  Alcohol Use  . 4.8 oz/week  . 1 Standard drinks or equivalent, 7 Cans of beer per week     History  Drug Use No    Comment: hx of cocaine use    Social History   Social History  . Marital status: Single    Spouse name: N/A  . Number of children: N/A  . Years of education: N/A   Social History Main Topics  . Smoking status: Current Every Day Smoker    Packs/day: 0.50    Years: 42.00    Types: Cigarettes  . Smokeless tobacco: Never Used     Comment: 1/2ppd   . Alcohol use 4.8 oz/week    1 Standard drinks or equivalent, 7 Cans of beer per week  . Drug use: No     Comment: hx of cocaine use  . Sexual activity: No   Other Topics Concern  . Not on file   Social History Narrative   Lives alone   Niece power of attorney   Gets SSI   Single         Additional Social History:                         Sleep: Good  Appetite:  Good  Current Medications: Current Outpatient Prescriptions  Medication Sig Dispense Refill  . carbamazepine (TEGRETOL) 200 MG tablet Take 1 tablet (200 mg total) by mouth 2 (two) times daily. 2 bid 120 tablet 6  . dextromethorphan (DELSYM) 30 MG/5ML liquid Take 5 mLs (30 mg total) by mouth 2 (two) times daily as needed for cough. 89 mL 0  . fluticasone (FLONASE) 50 MCG/ACT nasal spray Place 2 sprays into both nostrils daily. [place]  g 1  . folic acid (FOLVITE) 1 MG tablet Take 1 tablet (1 mg total) by mouth daily. 30 tablet 11  . hydrochlorothiazide (HYDRODIURIL) 25 MG tablet TAKE 1 TABLET (25 MG TOTAL) BY MOUTH DAILY. 90 tablet 1  . nicotine (NICODERM CQ - DOSED IN MG/24 HOURS) 14 mg/24hr patch Place 1 patch (14 mg total) onto the skin daily. 21 patch 1  . nicotine polacrilex (NICORETTE) 2 MG gum Take 1 each  (2 mg total) by mouth as needed for smoking cessation. 100 tablet 0  . pantoprazole (PROTONIX) 40 MG tablet Take 1 tablet (40 mg total) by mouth daily. 60 tablet 3  . sucralfate (CARAFATE) 1 GM/10ML suspension Take 10 mLs (1 g total) by mouth 4 (four) times daily -  with meals and at bedtime. (Patient not taking: Reported on 09/06/2016) 420 mL 0   No current facility-administered medications for this visit.     Lab Results: No results found for this or any previous visit (from the past 48 hour(s)).  Physical Findings: AIMS:  , ,  ,  ,    CIWA:    COWS:     Musculoskeletal: Strength & Muscle Tone: within normal limits Gait & Station: normal Patient leans: N/A  Psychiatric Specialty Exam: ROS  Blood pressure 118/80, pulse 70, height 5' 4.75" (1.645 m), weight 118 lb 12.8 oz (53.9 kg).Body mass index is 19.92 kg/m.  General Appearance: Casual  Eye Contact::  Good  Speech:  Clear and Coherent  Volume:  Normal  Mood:  Euthymic  Affect:  Appropriate  Thought Process:  Goal Directed  Orientation:  Full (Time, Place, and Person)  Thought Content:  WDL  Suicidal Thoughts:  No  Homicidal Thoughts:  No  Memory:  NA  Judgement:  Good  Insight:  Good  Psychomotor Activity:  Normal  Concentration:  Good  Recall:  Good  Fund of Knowledge:Good  Language: Good  Akathisia:  No  Handed:  Right  AIMS (if indicated):     Assets:  Desire for Improvement  ADL's:  Intact  Cognition: WNL  Sleep:        Blood pressure 118/80, pulse 70, height 5' 4.75" (1.645 m), weight 118 lb 12.8 oz (53.9 kg).Body mass index is 19.92 kg/m.  General Appearance: Casual  Eye Contact::  Good  Speech:  Clear and Coherent  Volume:  Normal  Mood:  Euthymic  Affect:  Appropriate  Thought Process:  Goal Directed  Orientation:  Full (Time, Place, and Person)  Thought Content:  WDL  Suicidal Thoughts:  No  Homicidal Thoughts:  No  Memory:  NA  Judgement:  Good  Insight:  Good  Psychomotor Activity:   Normal  Concentration:  Good  Recall:  Good  Fund of Knowledge:Good  Language: Good  Akathisia:  No  Handed:  Right  AIMS (if indicated):     Assets:  Desire for Improvement  ADL's:  Intact  Cognition: WNL  Sleep:         this patient is doing well. She is less impulsive. She's physically very stable. She's gained a little weight which is great. She sleeps well. She's got good energy. She stays active. She is very close with her family. Both her sisters are involved in her care. Today she's with [name] is a good report how well she is doing. At this time we'll go ahead and continue her Tegretol 200 mg taking 2 in the morning and 2 at night. Initially had patient was also  started on a nicotine patch. She claims she smokes less than 1 pack a day. I doubt these patches will be very helpful. Nonetheless is a great idea to reduce smoking as much possible. For me she's going to get another Tegretol level in the next week or 2. Her last comprehensive metabolic panel is very stable. Itching no evidence of alcohol abuse. Her liver enzymes were generally normal. Patient be getting Tegretol in the next few weeks. She will return to see me in 5 months for a 15 minute visit

## 2017-02-28 ENCOUNTER — Telehealth: Payer: Self-pay | Admitting: Pulmonary Disease

## 2017-02-28 NOTE — Telephone Encounter (Signed)
APT. REMINDER CALL, LMTCB °

## 2017-03-01 ENCOUNTER — Ambulatory Visit (INDEPENDENT_AMBULATORY_CARE_PROVIDER_SITE_OTHER): Payer: Medicaid Other | Admitting: Pulmonary Disease

## 2017-03-01 ENCOUNTER — Encounter (INDEPENDENT_AMBULATORY_CARE_PROVIDER_SITE_OTHER): Payer: Self-pay

## 2017-03-01 ENCOUNTER — Encounter: Payer: Self-pay | Admitting: Pulmonary Disease

## 2017-03-01 DIAGNOSIS — I10 Essential (primary) hypertension: Secondary | ICD-10-CM | POA: Diagnosis not present

## 2017-03-01 DIAGNOSIS — F17200 Nicotine dependence, unspecified, uncomplicated: Secondary | ICD-10-CM

## 2017-03-01 DIAGNOSIS — F17210 Nicotine dependence, cigarettes, uncomplicated: Secondary | ICD-10-CM | POA: Diagnosis not present

## 2017-03-01 MED ORDER — HYDROCHLOROTHIAZIDE 12.5 MG PO TABS: 12.5000 mg | ORAL_TABLET | Freq: Every day | ORAL | 1 refills | Status: DC

## 2017-03-01 NOTE — Patient Instructions (Signed)
Follow up in 4-6 weeks to follow up on how you are doing with the nicotine patches and quitting smoking.

## 2017-03-01 NOTE — Assessment & Plan Note (Signed)
Assessment: She has not started the nicotine patches but she was able to obtain with her insurance. She was not in full understanding of the purpose of the nicotine patches. She was afraid that putting the nicotine patches on with prevent her from having a cigarette whenever she had nicotine cravings.  Plan: Emphasize with her that the nicotine patch will decrease his cravings. Discussed strategies for breakthrough cravings. Follow-up in 6 weeks.

## 2017-03-01 NOTE — Assessment & Plan Note (Signed)
Assessment: BP 116/89.  Plan: Trial of decreased hydrochlorothiazide to 12.5 mg daily.

## 2017-03-01 NOTE — Progress Notes (Signed)
   CC: tobacco use  HPI:  [name] is a 61 y.o. woman with history of GERD, HTN, Alcohol abuse, tobacco use, anxiety here for follow up of tobacco use.  Past Medical History:  Diagnosis Date  . Allergy   . Anxiety   . Fetal alcohol syndrome 1959  . GERD (gastroesophageal reflux disease)   . Hypertension   . Pancreatitis   . Substance abuse    alcohol and tobacco  . Ulcer ([place])     Review of Systems:   No chest pain  No dypsnea  Physical Exam:  Vitals:   03/01/17 1557  BP: 116/89  Pulse: 74  Temp: 97.8 F (36.6 C)  TempSrc: Oral  SpO2: 98%  Weight: 114 lb 9.6 oz (52 kg)  Height: 5\' 4"  (1.626 m)   General Apperance: NAD HEENT: Normocephalic, atraumatic, anicteric sclera Neck: Supple, trachea midline Lungs: Clear to auscultation bilaterally. No wheezes, rhonchi or rales. Breathing comfortably Heart: Regular rate and rhythm, no murmur/rub/gallop Abdomen: Soft, nontender, nondistended, no rebound/guarding Extremities: Warm and well perfused, no edema Skin: No rashes or lesions Neurologic: Alert and interactive. No gross deficits.  Assessment & Plan:   See Encounters Tab for problem based charting.  Patient discussed with Dr. Lynnae January

## 2017-03-03 NOTE — Progress Notes (Signed)
Internal Medicine Clinic Attending  Case discussed with Dr. Krall soon after the resident saw the patient.  We reviewed the resident's history and exam and pertinent patient test results.  I agree with the assessment, diagnosis, and plan of care documented in the resident's note. 

## 2017-04-14 ENCOUNTER — Other Ambulatory Visit: Payer: Self-pay | Admitting: *Deleted

## 2017-04-14 MED ORDER — FLUTICASONE PROPIONATE 50 MCG/ACT NA SUSP: 1.0000 | Freq: Every day | NASAL | 3 refills | Status: AC

## 2017-04-24 ENCOUNTER — Encounter: Payer: Self-pay | Admitting: *Deleted

## 2017-05-25 ENCOUNTER — Other Ambulatory Visit: Payer: Self-pay | Admitting: *Deleted

## 2017-05-25 MED ORDER — FOLIC ACID 1 MG PO TABS
1.0000 mg | ORAL_TABLET | Freq: Every day | ORAL | 11 refills | Status: DC
Start: 2017-05-25 — End: 2023-07-24

## 2017-06-14 ENCOUNTER — Ambulatory Visit (HOSPITAL_COMMUNITY): Payer: Medicaid Other | Admitting: Psychiatry

## 2017-08-08 ENCOUNTER — Other Ambulatory Visit: Payer: Self-pay | Admitting: Internal Medicine

## 2017-08-08 ENCOUNTER — Other Ambulatory Visit: Payer: Self-pay | Admitting: Family Medicine

## 2017-08-08 DIAGNOSIS — Z1231 Encounter for screening mammogram for malignant neoplasm of breast: Secondary | ICD-10-CM

## 2017-08-17 ENCOUNTER — Ambulatory Visit
Admission: RE | Admit: 2017-08-17 | Discharge: 2017-08-17 | Disposition: A | Discharge status: Home / Self Care | Payer: Medicaid Other | Source: Ambulatory Visit | Attending: Family Medicine | Admitting: Family Medicine

## 2017-08-17 DIAGNOSIS — Z1231 Encounter for screening mammogram for malignant neoplasm of breast: Secondary | ICD-10-CM

## 2017-10-05 ENCOUNTER — Telehealth: Payer: Self-pay | Admitting: Internal Medicine

## 2017-10-05 ENCOUNTER — Other Ambulatory Visit: Payer: Self-pay | Admitting: *Deleted

## 2017-10-05 DIAGNOSIS — I10 Essential (primary) hypertension: Secondary | ICD-10-CM

## 2017-10-05 MED ORDER — HYDROCHLOROTHIAZIDE 12.5 MG PO TABS: 12.5000 mg | ORAL_TABLET | Freq: Every day | ORAL | 3 refills | Status: DC

## 2017-10-05 NOTE — Telephone Encounter (Signed)
THIS HAS BEEN REQUESTED

## 2017-10-05 NOTE — Telephone Encounter (Signed)
Refill request completely out of medicine hydrochlorothiazide (HYDRODIURIL) 12.5 MG tablet.  Send to  [id number] - [place], [place] [phone] (Phone) [phone] (Fax)

## 2017-10-09 ENCOUNTER — Encounter (INDEPENDENT_AMBULATORY_CARE_PROVIDER_SITE_OTHER): Payer: Self-pay

## 2017-10-09 ENCOUNTER — Ambulatory Visit: Payer: Medicaid Other | Admitting: Internal Medicine

## 2017-10-09 ENCOUNTER — Encounter: Payer: Self-pay | Admitting: Internal Medicine

## 2017-10-09 VITALS — BP 163/104 | HR 60 | Temp 97.6°F | Ht 64.0 in | Wt 107.7 lb

## 2017-10-09 DIAGNOSIS — I10 Essential (primary) hypertension: Secondary | ICD-10-CM | POA: Diagnosis present

## 2017-10-09 DIAGNOSIS — F17210 Nicotine dependence, cigarettes, uncomplicated: Secondary | ICD-10-CM | POA: Diagnosis not present

## 2017-10-09 DIAGNOSIS — F419 Anxiety disorder, unspecified: Secondary | ICD-10-CM | POA: Diagnosis not present

## 2017-10-09 DIAGNOSIS — K219 Gastro-esophageal reflux disease without esophagitis: Secondary | ICD-10-CM | POA: Diagnosis not present

## 2017-10-09 DIAGNOSIS — K0889 Other specified disorders of teeth and supporting structures: Secondary | ICD-10-CM | POA: Diagnosis not present

## 2017-10-09 DIAGNOSIS — G3184 Mild cognitive impairment, so stated: Secondary | ICD-10-CM | POA: Diagnosis not present

## 2017-10-09 DIAGNOSIS — Q860 Fetal alcohol syndrome (dysmorphic): Secondary | ICD-10-CM

## 2017-10-09 DIAGNOSIS — Z79899 Other long term (current) drug therapy: Secondary | ICD-10-CM

## 2017-10-09 DIAGNOSIS — Z0000 Encounter for general adult medical examination without abnormal findings: Secondary | ICD-10-CM

## 2017-10-09 DIAGNOSIS — F17200 Nicotine dependence, unspecified, uncomplicated: Secondary | ICD-10-CM

## 2017-10-09 NOTE — Assessment & Plan Note (Signed)
Still smoking 1/2 ppd however niece in room indicates more. Niece notes patient has not tried nicotine patches or gum since they were recommended earlier this year. We had good discussion today about the importance of tobacco use cessation as it benefits health including BP reduction, reduces risk of developing lung and heart disease as well as reduces stroke risk. We worked to identify that her most prominent triggers to smoke are after meals and when she wakes up in the morning.  -10 minutes cessation counseling -Will work towards reducing by 1 cigarette daily/weekly -To use gum or patches

## 2017-10-09 NOTE — Patient Instructions (Addendum)
FOLLOW-UP INSTRUCTIONS When: Follow-up with PCP (Dr. Danford Bad) in 1 year, or sooner if needed For: Blood pressure, healthcare maintenance    It was great seeing you today! I'm glad you are doing well. Today we talked about your blood pressure. It is doing just fine. Please continue to take your HCTZ 12.5mg  once daily. Please try to limit your SODIUM/SALT intake. Instead of adding salt do your diet, you can use Mrs. Dash seasoning.   Today I will get some routine labs on you to make sure your kidneys are doing OK.   Please come back to clinic in 1 year for check-up, or sooner if needed!

## 2017-10-09 NOTE — Progress Notes (Signed)
Internal Medicine Clinic Attending  Case discussed with Dr. Molt at the time of the visit.  We reviewed the resident's history and exam and pertinent patient test results.  I agree with the assessment, diagnosis, and plan of care documented in the resident's note. 

## 2017-10-09 NOTE — Assessment & Plan Note (Signed)
UTD mammogram. She needs BMET, HepC screen and HIV screen which will all be collected today. Offered colonoscopy or stool cards however patient and niece deferred. Will offer again at next clinic visit.

## 2017-10-09 NOTE — Assessment & Plan Note (Signed)
BP just uncontrolled today at 140/100. Worse on repeat after patient aggravated about getting labs today. Pt endorses taking her BP medications just prior to arriving here in clinic and also notes she's been eating a lot of salt lately. Good discussion was had about sodium reduction and medication compliance.  As she's been controlled for the past year, just took medication and also endorses dietary indiscretion we will continue current dose. We did however discuss appropriate diet as well as tobacco cessation as well.  -BMET today -Continue HCTZ 12.5mg  -Pts niece will check BP at home after 1 week and call clinic if >140/100

## 2017-10-09 NOTE — Progress Notes (Signed)
   CC: follow-up of HTN  HPI:  [name] is a 61 y.o. F with FAS with mild cognitive impairment, HTN, tobacco abuse, GERD and anxiety here for follow up of her HTN and tobacco abuse. On HCTZ 12.5mg  BP has been well controlled over the past year based on periodic measurements recorded in chart. Took medicine about 30 minutes prior to appointment. Does note she's been eating quite a bit of salt lately and is aware its bad for her BP. No complaints.  HCM: Normal PAP earlier this year. Never had colonoscopy or HepC Screen. Most recent HIV screen non-reactive in 2013.   Past Medical History:  Diagnosis Date  . Allergy   . Anxiety   . Fetal alcohol syndrome 1959  . GERD (gastroesophageal reflux disease)   . Hypertension   . Pancreatitis   . Substance abuse ([place])    alcohol and tobacco  . Ulcer    Review of Systems:   General: Denies fevers, chills, fatigue HEENT: Denies changes in vision, dysphagia Cardiac: Denies CP, SOB Pulmonary: Denies cough, wheezes Abd: Denies diarrhea, constipation, changes in bowels Extremities: Denies weakness or swelling  Physical Exam: General: Alert, in no acute distress. Pleasant and conversant. Niece present in room.  HEENT: No icterus, injection or ptosis. No hoarseness or dysarthria. Missing several upper and lower teeth but no appreciable erythema or abscess. Cardiac: RRR, no MGR appreciated Pulmonary: Lungs diminished BL but with normal WOB on RA. Able to speak in complete sentences Abd: Soft, non-tended. +bs Extremities: Warm, perfused. No significant pedal edema.   Vitals:   10/09/17 0902 10/09/17 0920  BP: (!) 147/103 (!) 163/104  Pulse: 66 60  Temp: 97.6 F (36.4 C)   TempSrc: Oral   SpO2: 100%   Weight: 107 lb 11.2 oz (48.9 kg)   Height: 5\' 4"  (1.626 m)    Assessment & Plan:   See Encounters Tab for problem based charting.  Patient discussed with Dr. Dareen Piano

## 2017-10-10 LAB — BMP8+ANION GAP
Anion Gap: 16 mmol/L (ref 10.0–18.0)
BUN/Creatinine Ratio: 8 — ABNORMAL LOW (ref 9–23)
BUN: 7 mg/dL (ref 6–24)
CALCIUM: 9.6 mg/dL (ref 8.7–10.2)
CO2: 24 mmol/L (ref 20–29)
CREATININE: 0.91 mg/dL (ref 0.57–1.00)
Chloride: 100 mmol/L (ref 96–106)
GFR, EST AFRICAN AMERICAN: 80 mL/min/{1.73_m2} (ref >59)
GFR, EST NON AFRICAN AMERICAN: 69 mL/min/{1.73_m2} (ref >59)
Glucose: 93 mg/dL (ref 65–99)
Potassium: 3.7 mmol/L (ref 3.5–5.2)
Sodium: 140 mmol/L (ref 134–144)

## 2017-10-10 LAB — HIV ANTIBODY (ROUTINE TESTING W REFLEX): HIV SCREEN 4TH GENERATION: NONREACTIVE

## 2017-10-10 LAB — HEPATITIS C ANTIBODY: Hep C Virus Ab: <0.1 s/co ratio (ref 0.0–0.9)

## 2018-11-30 ENCOUNTER — Other Ambulatory Visit: Payer: Self-pay | Admitting: Family Medicine

## 2018-11-30 DIAGNOSIS — Z1231 Encounter for screening mammogram for malignant neoplasm of breast: Secondary | ICD-10-CM

## 2019-01-02 ENCOUNTER — Ambulatory Visit
Admission: RE | Admit: 2019-01-02 | Discharge: 2019-01-02 | Disposition: A | Discharge status: Home / Self Care | Payer: Medicaid Other | Source: Ambulatory Visit | Attending: Family Medicine | Admitting: Family Medicine

## 2019-01-02 DIAGNOSIS — Z1231 Encounter for screening mammogram for malignant neoplasm of breast: Secondary | ICD-10-CM

## 2019-03-26 ENCOUNTER — Other Ambulatory Visit: Payer: Self-pay

## 2019-03-26 ENCOUNTER — Ambulatory Visit (INDEPENDENT_AMBULATORY_CARE_PROVIDER_SITE_OTHER): Payer: Medicaid Other | Admitting: Internal Medicine

## 2019-03-26 DIAGNOSIS — Z79899 Other long term (current) drug therapy: Secondary | ICD-10-CM | POA: Diagnosis not present

## 2019-03-26 DIAGNOSIS — I10 Essential (primary) hypertension: Secondary | ICD-10-CM | POA: Diagnosis present

## 2019-03-26 MED ORDER — HYDROCHLOROTHIAZIDE 12.5 MG PO TABS: 12.5000 mg | ORAL_TABLET | Freq: Every day | ORAL | 3 refills | Status: DC

## 2019-03-26 NOTE — Progress Notes (Signed)
Internal Medicine Clinic Attending  Case discussed with Dr. Reesa Chew at the time of the visit.  We reviewed the resident's history and exam and pertinent patient test results.  I agree with the assessment, diagnosis, and plan of care documented in the resident's note.  Will refill HCTZ today, suspect she will need an additional agent as BP was not well controlled at last visit in 2018 while on HCTZ  Lenice Pressman, M.D., Ph.D.

## 2019-03-26 NOTE — Assessment & Plan Note (Signed)
BP Readings from Last 3 Encounters:  03/26/19 (!) 151/101  10/09/17 (!) 163/104  03/01/17 116/89   Her blood pressure was elevated today, she was not using her HCTZ for more than 2 weeks now.  -Give her a refill of HCTZ 12.5 mg daily according to her previous dose. Blood pressure was elevated during previous office visit in 2018. She will need reevaluation within 2 to 4 weeks with PCP to see if she needs a dose change or add another agent.

## 2019-03-26 NOTE — Patient Instructions (Signed)
Thank you for visiting clinic today. I am giving you a new prescription of hydrochlorothiazide with few refills.  Please restart taking your medications regularly. Please follow-up with your PCP within 2 to 4 weeks for your physical and blood pressure check.

## 2019-03-26 NOTE — Progress Notes (Signed)
   CC: For follow-up of her hypertension.  HPI:  [name] is a 63 y.o. with past medical history as listed below came to the clinic for follow-up of her hypertension today. She ran out of her meds for more than 2 weeks now.  Unable to obtain over the phone refill as she was not seen in the clinic since 2018. She has no complaints today.  Please see assessment and plan for her chronic conditions.  Past Medical History:  Diagnosis Date  . Allergy   . Anxiety   . Fetal alcohol syndrome 1959  . GERD (gastroesophageal reflux disease)   . Hypertension   . Pancreatitis   . Substance abuse ([place])    alcohol and tobacco  . Ulcer    Review of Systems: Negative except mentioned in HPI  Physical Exam:  Vitals:   03/26/19 1344  BP: (!) 151/101  Pulse: 60  Temp: 98.2 F (36.8 C)  TempSrc: Oral  SpO2: 100%  Weight: 107 lb (48.5 kg)   General: Vital signs reviewed.  Patient is well-developed and well-nourished, in no acute distress and cooperative with exam.  Head: Normocephalic and atraumatic. Eyes: EOMI, conjunctivae normal, no scleral icterus.  Cardiovascular: RRR, S1 normal, S2 normal, no murmurs, gallops, or rubs. Pulmonary/Chest: Clear to auscultation bilaterally, no wheezes, rales, or rhonchi. Abdominal: Soft, non-tender, non-distended, BS +,  Extremities: No lower extremity edema bilaterally,  pulses symmetric and intact bilaterally. No cyanosis or clubbing. Skin: Warm, dry and intact. No rashes or erythema. Psychiatric: Normal mood and affect. speech and behavior is normal. Cognition and memory are normal.  Assessment & Plan:   See Encounters Tab for problem based charting.  Patient discussed with Dr. Rebeca Alert.

## 2019-06-03 ENCOUNTER — Other Ambulatory Visit: Payer: Self-pay

## 2019-06-03 ENCOUNTER — Ambulatory Visit: Payer: Medicaid Other | Admitting: Internal Medicine

## 2019-06-03 ENCOUNTER — Encounter: Payer: Self-pay | Admitting: Internal Medicine

## 2019-06-03 VITALS — BP 129/92 | HR 70 | Temp 98.7°F | Wt 105.1 lb

## 2019-06-03 DIAGNOSIS — F17210 Nicotine dependence, cigarettes, uncomplicated: Secondary | ICD-10-CM

## 2019-06-03 DIAGNOSIS — Z79899 Other long term (current) drug therapy: Secondary | ICD-10-CM

## 2019-06-03 DIAGNOSIS — I10 Essential (primary) hypertension: Secondary | ICD-10-CM

## 2019-06-03 DIAGNOSIS — F17200 Nicotine dependence, unspecified, uncomplicated: Secondary | ICD-10-CM

## 2019-06-03 DIAGNOSIS — G44209 Tension-type headache, unspecified, not intractable: Secondary | ICD-10-CM | POA: Diagnosis not present

## 2019-06-03 DIAGNOSIS — Z0000 Encounter for general adult medical examination without abnormal findings: Secondary | ICD-10-CM

## 2019-06-03 MED ORDER — EXCEDRIN MIGRAINE 250-250-65 MG PO TABS: 2.0000 | ORAL_TABLET | Freq: Four times a day (QID) | ORAL | 0 refills | Status: AC | PRN

## 2019-06-03 MED ORDER — HYDROCORTISONE 1 % EX CREA: TOPICAL_CREAM | CUTANEOUS | 1 refills | Status: AC

## 2019-06-03 NOTE — Patient Instructions (Addendum)
Ms. Viera, It was a pleasure meeting you. I look forward to serving as your primary care physician.   Today we discussed:  Your blood pressure which looks great today. I will check some labs. Please continue to take your medication as prescribed.   Headaches: these are most likely tension headaches. I'd recommend taking Ibuprofen 600 mg or you can purchase Excedrin at your local pharmacy. Take 2 pills when you feel the headache come on.   I sent the cream in for your bug bite.   You are doing well, and I will plan to see you back sometime next year!  Please call sooner if something comes up.   Take care, Dr. Koleen Distance

## 2019-06-03 NOTE — Progress Notes (Signed)
Internal Medicine Clinic Attending  Case discussed with Dr. Bloomfield at the time of the visit.  We reviewed the resident's history and exam and pertinent patient test results.  I agree with the assessment, diagnosis, and plan of care documented in the resident's note.  

## 2019-06-03 NOTE — Assessment & Plan Note (Signed)
Patient complains of intermittent headaches that are located in bilateral temples and described as throbbing. She gets approximately 1 per month. Headache resolves on its own. She has not tried taking any medications for them. Symptoms most consistent with tension headache. Instructed her to take NSAIDs when headache begins. If these do not work, she can try taking combination Acetaminophen with caffeine (Excedrin).

## 2019-06-03 NOTE — Assessment & Plan Note (Signed)
Still smoking 1/2 ppd. She has a 22 pack year smoking history. Does not meet recommended guidelines for LDCT scan for lung cancer screening. Counseled her today on smoking cessation and various options that are available to her. She remains in pre-contemplation and is not ready to quit.

## 2019-06-03 NOTE — Progress Notes (Signed)
   CC: HTN  HPI:  [name] is a 63 y.o. female with PMHx listed below who presents for follow-up on HTN and intermittent headaches. Please see problem based charting for further details.   Past Medical History:  Diagnosis Date  . Allergy   . Anxiety   . Fetal alcohol syndrome 1959  . GERD (gastroesophageal reflux disease)   . Hypertension   . Pancreatitis   . Substance abuse ([place])    alcohol and tobacco  . Ulcer    Review of Systems:  Review of Systems  Constitutional: Negative for chills, fever and weight loss.  HENT: Negative for congestion and sore throat.   Eyes: Negative for blurred vision.  Respiratory: Negative for cough and shortness of breath.   Cardiovascular: Negative for chest pain, palpitations and leg swelling.  Gastrointestinal: Negative for abdominal pain, blood in stool and melena.  Genitourinary: Negative for dysuria.  Musculoskeletal: Negative for falls.  Neurological: Negative for dizziness and focal weakness.     Physical Exam:  Vitals:   06/03/19 1338  BP: (!) 129/92  Pulse: 70  Temp: 98.7 F (37.1 C)  TempSrc: Oral  SpO2: 100%  Weight: 105 lb 1.6 oz (47.7 kg)   Physical Exam Constitutional:      General: She is not in acute distress.    Appearance: Normal appearance. She is normal weight.  Eyes:     Conjunctiva/sclera: Conjunctivae normal.  Neck:     Musculoskeletal: Neck supple.  Cardiovascular:     Rate and Rhythm: Normal rate and regular rhythm.     Pulses: Normal pulses.  Pulmonary:     Effort: Pulmonary effort is normal.     Breath sounds: Normal breath sounds.  Abdominal:     General: Abdomen is flat. Bowel sounds are normal.     Palpations: Abdomen is soft.     Tenderness: There is no abdominal tenderness.  Musculoskeletal: Normal range of motion.     Right lower leg: No edema.     Left lower leg: No edema.  Lymphadenopathy:     Cervical: No cervical adenopathy.  Neurological:     General: No focal deficit  present.     Mental Status: She is alert and oriented to person, place, and time.  Psychiatric:        Mood and Affect: Mood normal.        Behavior: Behavior normal.      Assessment & Plan:   See Encounters Tab for problem based charting.  Patient discussed with Dr. Daryll Drown

## 2019-06-03 NOTE — Assessment & Plan Note (Signed)
Patient states she had a colonoscopy 2-3 year ago at [facility]. However, after further chart review there has been no documented screening. Will plan to discuss further at next visit and will likely refer for colonoscopy.

## 2019-06-03 NOTE — Assessment & Plan Note (Signed)
Blood pressure well controlled today. She is tolerating HCTZ 12.5 mg daily without side effects. Will check BMP today and continue current therapy.

## 2019-06-04 LAB — BMP8+ANION GAP
Anion Gap: 18 mmol/L (ref 10.0–18.0)
BUN/Creatinine Ratio: 20 (ref 12–28)
BUN: 17 mg/dL (ref 8–27)
CO2: 23 mmol/L (ref 20–29)
Calcium: 9.8 mg/dL (ref 8.7–10.3)
Chloride: 99 mmol/L (ref 96–106)
Creatinine, Ser: 0.86 mg/dL (ref 0.57–1.00)
GFR calc Af Amer: 85 mL/min/{1.73_m2} (ref >59)
GFR calc non Af Amer: 74 mL/min/{1.73_m2} (ref >59)
Glucose: 87 mg/dL (ref 65–99)
Potassium: 3.8 mmol/L (ref 3.5–5.2)
Sodium: 140 mmol/L (ref 134–144)

## 2019-06-27 ENCOUNTER — Ambulatory Visit (INDEPENDENT_AMBULATORY_CARE_PROVIDER_SITE_OTHER): Payer: Medicaid Other | Admitting: Psychiatry

## 2019-06-27 ENCOUNTER — Other Ambulatory Visit: Payer: Self-pay

## 2019-06-27 DIAGNOSIS — F319 Bipolar disorder, unspecified: Secondary | ICD-10-CM

## 2019-06-27 MED ORDER — CARBAMAZEPINE 200 MG PO TABS: ORAL_TABLET | ORAL | 6 refills | Status: DC

## 2019-06-27 MED ORDER — CARBAMAZEPINE 200 MG PO TABS
ORAL_TABLET | ORAL | 6 refills | Status: DC
Start: 2019-06-27 — End: 2019-09-27

## 2019-06-27 NOTE — Progress Notes (Signed)
Psychiatric Initial Adult Assessment   Patient Identification: Roberta Miller MRN:  [id number] Date of Evaluation:  06/27/2019 Referral Source: Self-referred Chief Complaint:   Visit Diagnosis: No diagnosis found.  History of Present Illness:    This patient is 63 year old African-American female who is well-known to me.  She had a care approximately 2 years ago because she was unable to get to our clinic.  A year after she was finished with me she continue taking Tegretol but is been out of it for the last 6 months to the last year.  She is noted herself that she is become more irritable.  She has a particular conflict related to her boyfriend alcohol.  She says it is getting on her nerves much more.  She is having required to control her self.  She is asked her family to bring her back to see me and to consider going back on the Tegretol that she was taking successfully.  The patient denies being suicidal or homicidal.  She denies daily depression or any vegetative symptoms.  She has no psychosis at all.  She drinks no alcohol uses no drugs.  Medically she is very stable.  She takes no other medical medicines.  She lives alone she cooks on her own and she functions fairly well.  She has no significant complaints at this time other than that she is fearful she is becoming more more irritable and might possibly explode.  When I first met her 2 or 3 years ago she actually was very violent.  She did well respond to Tegretol and wishes now to be back on it.  She is clearly showing good insight.  Associated Signs/Symptoms: Depression Symptoms:  anhedonia, hypersomnia, anxiety, (Hypo) Manic Symptoms:   Anxiety Symptoms:   Psychotic Symptoms:   PTSD Symptoms:   Past Psychiatric History:   Previous Psychotropic Medications: Tegretol  Substance Abuse History in the last 12 months:    Consequences of Substance Abuse:   Past Medical History:  Past Medical History:  Diagnosis Date  . Allergy    . Anxiety   . Fetal alcohol syndrome 1959  . GERD (gastroesophageal reflux disease)   . Hypertension   . Pancreatitis   . Substance abuse ([place])    alcohol and tobacco  . Ulcer     Past Surgical History:  Procedure Laterality Date  . ESOPHAGOGASTRODUODENOSCOPY N/A 06/21/2016   Procedure: ESOPHAGOGASTRODUODENOSCOPY (EGD);  Surgeon: Milus Banister, MD;  Location: [facility];  Service: Endoscopy;  Laterality: N/A;  . NO PAST SURGERIES      Family Psychiatric History:   Family History:  Family History  Problem Relation Age of Onset  . Cancer Mother        died of breast cancer  . Alcoholism Mother   . Breast cancer Mother   . Hypertension Sister   . Breast cancer Sister   . Hypertension Brother   . Diabetes Brother   . Cancer - Ovarian Sister   . Colon cancer Neg Hx     Social History:   Social History   Socioeconomic History  . Marital status: Single    Spouse name: Not on file  . Number of children: Not on file  . Years of education: Not on file  . Highest education level: Not on file  Occupational History  . Not on file  Social Needs  . Financial resource strain: Not on file  . Food insecurity    Worry: Not on file    Inability:  Not on file  . Transportation needs    Medical: Not on file    Non-medical: Not on file  Tobacco Use  . Smoking status: Current Every Day Smoker    Packs/day: 0.50    Years: 42.00    Pack years: 21.00    Types: Cigarettes  . Smokeless tobacco: Never Used  . Tobacco comment: 1/2ppd   Substance and Sexual Activity  . Alcohol use: Yes    Alcohol/week: 8.0 standard drinks    Types: 7 Cans of beer, 1 Standard drinks or equivalent per week  . Drug use: No    Comment: hx of cocaine use  . Sexual activity: Never  Lifestyle  . Physical activity    Days per week: Not on file    Minutes per session: Not on file  . Stress: Not on file  Relationships  . Social Herbalist on phone: Not on file    Gets together: Not on  file    Attends religious service: Not on file    Active member of club or organization: Not on file    Attends meetings of clubs or organizations: Not on file    Relationship status: Not on file  Other Topics Concern  . Not on file  Social History Narrative   Lives alone   Niece power of attorney   Gets SSI   Single          Additional Social History:   Allergies:   Allergies  Allergen Reactions  . Other Rash    Tomatoes and Chocolate     Metabolic Disorder Labs: No results found for: HGBA1C, MPG No results found for: PROLACTIN Lab Results  Component Value Date   CHOL 168 11/24/2009   TRIG 68 11/24/2009   HDL 53 11/24/2009   CHOLHDL 3.2 Ratio 11/24/2009   VLDL 14 11/24/2009   LDLCALC 101 (H) 11/24/2009   Lab Results  Component Value Date   TSH 1.897 11/09/2011    Therapeutic Level Labs: No results found for: LITHIUM Lab Results  Component Value Date   CBMZ 6.4 02/24/2015   No results found for: VALPROATE  Current Medications: Current Outpatient Medications  Medication Sig Dispense Refill  . aspirin-acetaminophen-caffeine (EXCEDRIN MIGRAINE) 250-250-65 MG tablet Take 2 tablets by mouth every 6 (six) hours as needed for headache. 30 tablet 0  . carbamazepine (TEGRETOL) 200 MG tablet 1  Bid for 2 weeks then 2 bid 120 tablet 6  . fluticasone (FLONASE) 50 MCG/ACT nasal spray Place 1 spray into both nostrils daily. 48 g 3  . folic acid (FOLVITE) 1 MG tablet Take 1 tablet (1 mg total) by mouth daily. 30 tablet 11  . hydrochlorothiazide (HYDRODIURIL) 12.5 MG tablet Take 1 tablet (12.5 mg total) by mouth daily. 90 tablet 3  . hydrocortisone cream 1 % Apply to affected area 2 times daily 30 g 1  . nicotine (NICODERM CQ - DOSED IN MG/24 HOURS) 14 mg/24hr patch Place 1 patch (14 mg total) onto the skin daily. 21 patch 1  . nicotine polacrilex (NICORETTE) 2 MG gum Take 1 each (2 mg total) by mouth as needed for smoking cessation. 100 tablet 0  . pantoprazole  (PROTONIX) 40 MG tablet Take 1 tablet (40 mg total) by mouth daily. 60 tablet 3   No current facility-administered medications for this visit.     Musculoskeletal: Strength & Muscle Tone: within normal limits Gait & Station: normal Patient leans: Right  Psychiatric Specialty Exam: ROS  There were no vitals taken for this visit.There is no height or weight on file to calculate BMI.  General Appearance: NA  Eye Contact:  Good  Speech:  Clear and Coherent  Volume:  Normal  Mood:  Negative  Affect:  NA  Thought Process:  Goal Directed  Orientation:  NA  Thought Content:  WDL  Suicidal Thoughts:  No  Homicidal Thoughts:  No  Memory:  Negative  Judgement:  Good  Insight:  Fair  Psychomotor Activity:  NA and Normal  Concentration:    Recall:  Negative  Fund of Knowledge:Fair  Language: NA  Akathisia:  No  Handed:  Right  AIMS (if indicated):  not done  Assets:  Communication Skills  ADL's:  Intact  Cognition: WNL  Sleep:  Negative   Screenings: PHQ2-9     Office Visit from 06/03/2019 in [place] Office Visit from 03/26/2019 in [place] Office Visit from 10/09/2017 in [place] Office Visit from 03/01/2017 in [place] Office Visit from 01/05/2017 in [place]  PHQ-2 Total Score  0  0  0  0  0  PHQ-9 Total Score  0  -  -  -  -      Assessment and Plan:   At this time the patient is doing well.  She does have issues with irritability.  I believe she has a form of bipolar disorder which is well controlled on Tegretol.  At this time she will return back to Tegretol taking 200 mg twice daily for 2 weeks then increase it to 2 in the morning and 2 at night.  She is not aggressive or violent.  She is not dangerous to himself or anyone else.  Patient just realizes that she is closer to the age and gets quickly irritable and now more so with her  boyfriend.  Jerral Ralph, MD 8/20/20202:04 PM

## 2019-09-27 ENCOUNTER — Other Ambulatory Visit: Payer: Self-pay

## 2019-09-27 ENCOUNTER — Ambulatory Visit (INDEPENDENT_AMBULATORY_CARE_PROVIDER_SITE_OTHER): Payer: Medicaid Other | Admitting: Psychiatry

## 2019-09-27 DIAGNOSIS — F3110 Bipolar disorder, current episode manic without psychotic features, unspecified: Secondary | ICD-10-CM

## 2019-09-27 MED ORDER — CARBAMAZEPINE 200 MG PO TABS: ORAL_TABLET | ORAL | 6 refills | Status: DC
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
## 2019-12-18 ENCOUNTER — Other Ambulatory Visit (HOSPITAL_COMMUNITY): Payer: Self-pay

## 2019-12-18 DIAGNOSIS — Z79899 Other long term (current) drug therapy: Secondary | ICD-10-CM

## 2019-12-20 ENCOUNTER — Ambulatory Visit (HOSPITAL_COMMUNITY): Payer: Medicaid Other | Admitting: Psychiatry

## 2019-12-25 LAB — COMPREHENSIVE METABOLIC PANEL
ALT: 10 IU/L (ref 0–32)
AST: 19 IU/L (ref 0–40)
Albumin/Globulin Ratio: 1.9 (ref 1.2–2.2)
Albumin: 4.8 g/dL (ref 3.8–4.8)
Alkaline Phosphatase: 116 IU/L (ref 39–117)
BUN/Creatinine Ratio: 17 (ref 12–28)
BUN: 16 mg/dL (ref 8–27)
Bilirubin Total: <0.2 mg/dL (ref 0.0–1.2)
CO2: 23 mmol/L (ref 20–29)
Calcium: 9.7 mg/dL (ref 8.7–10.3)
Chloride: 101 mmol/L (ref 96–106)
Creatinine, Ser: 0.92 mg/dL (ref 0.57–1.00)
GFR calc Af Amer: 78 mL/min/{1.73_m2} (ref >59)
GFR calc non Af Amer: 67 mL/min/{1.73_m2} (ref >59)
Globulin, Total: 2.5 g/dL (ref 1.5–4.5)
Glucose: 87 mg/dL (ref 65–99)
Potassium: 4.3 mmol/L (ref 3.5–5.2)
Sodium: 141 mmol/L (ref 134–144)
Total Protein: 7.3 g/dL (ref 6.0–8.5)

## 2019-12-25 LAB — CBC WITH DIFFERENTIAL
Basophils Absolute: 0.1 10*3/uL (ref 0.0–0.2)
Basos: 0 %
EOS (ABSOLUTE): 0.1 10*3/uL (ref 0.0–0.4)
Eos: 1 %
Hematocrit: 44.9 % (ref 34.0–46.6)
Hemoglobin: 14.8 g/dL (ref 11.1–15.9)
Immature Grans (Abs): 0 10*3/uL (ref 0.0–0.1)
Immature Granulocytes: 0 %
Lymphocytes Absolute: 2.6 10*3/uL (ref 0.7–3.1)
Lymphs: 20 %
MCH: 30.5 pg (ref 26.6–33.0)
MCHC: 33 g/dL (ref 31.5–35.7)
MCV: 93 fL (ref 79–97)
Monocytes Absolute: 0.7 10*3/uL (ref 0.1–0.9)
Monocytes: 5 %
Neutrophils Absolute: 9.5 10*3/uL — ABNORMAL HIGH (ref 1.4–7.0)
Neutrophils: 74 %
RBC: 4.85 x10E6/uL (ref 3.77–5.28)
RDW: 12.7 % (ref 11.7–15.4)
WBC: 13 10*3/uL — ABNORMAL HIGH (ref 3.4–10.8)

## 2019-12-25 LAB — TSH: TSH: 2.21 u[IU]/mL (ref 0.450–4.500)

## 2019-12-25 LAB — VALPROIC ACID LEVEL: Valproic Acid Lvl: <4 ug/mL — ABNORMAL LOW (ref 50–100)

## 2019-12-27 ENCOUNTER — Ambulatory Visit (INDEPENDENT_AMBULATORY_CARE_PROVIDER_SITE_OTHER): Payer: Medicaid Other | Admitting: Psychiatry

## 2019-12-27 ENCOUNTER — Other Ambulatory Visit: Payer: Self-pay

## 2019-12-27 DIAGNOSIS — F419 Anxiety disorder, unspecified: Secondary | ICD-10-CM | POA: Diagnosis not present

## 2019-12-27 DIAGNOSIS — F0391 Unspecified dementia with behavioral disturbance: Secondary | ICD-10-CM

## 2019-12-27 DIAGNOSIS — F0281 Dementia in other diseases classified elsewhere with behavioral disturbance: Secondary | ICD-10-CM

## 2019-12-27 DIAGNOSIS — F02818 Dementia in other diseases classified elsewhere, unspecified severity, with other behavioral disturbance: Secondary | ICD-10-CM

## 2019-12-27 MED ORDER — CARBAMAZEPINE 200 MG PO TABS: ORAL_TABLET | ORAL | 6 refills | Status: DC
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
## 2019-12-28 ENCOUNTER — Encounter (HOSPITAL_COMMUNITY): Payer: Self-pay | Admitting: Psychiatry

## 2020-02-08 ENCOUNTER — Ambulatory Visit: Payer: Self-pay | Attending: Internal Medicine

## 2020-02-08 DIAGNOSIS — Z23 Encounter for immunization: Secondary | ICD-10-CM

## 2020-02-08 NOTE — Progress Notes (Signed)
   Covid-19 Vaccination Clinic  Name:  Roberta Miller    MRN: [id number] DOB: 1956-08-03  02/08/2020  [name] was observed post Covid-19 immunization for 15 minutes without incident. She was provided with Vaccine Information Sheet and instruction to access the V-Safe system.   [name] was instructed to call 911 with any severe reactions post vaccine: Marland Kitchen Difficulty breathing  . Swelling of face and throat  . A fast heartbeat  . A bad rash all over body  . Dizziness and weakness   Immunizations Administered    Name Date Dose VIS Date Route   Pfizer COVID-19 Vaccine 02/08/2020  8:44 AM 0.3 mL 10/18/2019 Intramuscular   Manufacturer: [place]   Lot: [id number]   [place]: [id number]

## 2020-03-03 ENCOUNTER — Ambulatory Visit: Payer: Medicaid Other | Attending: Internal Medicine

## 2020-03-03 DIAGNOSIS — Z23 Encounter for immunization: Secondary | ICD-10-CM

## 2020-03-03 NOTE — Progress Notes (Signed)
   Covid-19 Vaccination Clinic  Name:  Roberta Miller    MRN: [id number] DOB: Jun 26, 1956  03/03/2020  [name] was observed post Covid-19 immunization for 15 minutes without incident. She was provided with Vaccine Information Sheet and instruction to access the V-Safe system.   [name] was instructed to call 911 with any severe reactions post vaccine: Marland Kitchen Difficulty breathing  . Swelling of face and throat  . A fast heartbeat  . A bad rash all over body  . Dizziness and weakness   Immunizations Administered    Name Date Dose VIS Date Route   Pfizer COVID-19 Vaccine 03/03/2020  9:11 AM 0.3 mL 01/01/2019 Intramuscular   Manufacturer: [place]   Lot: [id number]   [place]: [id number]

## 2020-03-11 ENCOUNTER — Other Ambulatory Visit: Payer: Self-pay | Admitting: *Deleted

## 2020-03-11 DIAGNOSIS — I10 Essential (primary) hypertension: Secondary | ICD-10-CM

## 2020-03-11 MED ORDER — HYDROCHLOROTHIAZIDE 12.5 MG PO TABS: 12.5000 mg | ORAL_TABLET | Freq: Every day | ORAL | 3 refills | Status: DC

## 2020-03-18 ENCOUNTER — Other Ambulatory Visit: Payer: Self-pay

## 2020-03-18 ENCOUNTER — Telehealth (INDEPENDENT_AMBULATORY_CARE_PROVIDER_SITE_OTHER): Payer: Medicaid Other | Admitting: Psychiatry

## 2020-03-18 DIAGNOSIS — F4325 Adjustment disorder with mixed disturbance of emotions and conduct: Secondary | ICD-10-CM | POA: Diagnosis not present

## 2020-03-18 MED ORDER — CARBAMAZEPINE 200 MG PO TABS: ORAL_TABLET | ORAL | 6 refills | Status: DC

## 2020-03-18 NOTE — Progress Notes (Signed)
Psychiatric Initial Adult Assessment   Patient Identification: Roberta Miller MRN:  [id number] Date of Evaluation:  03/18/2020 Referral Source: Self-referred Chief Complaint:   Visit Diagnosis: No diagnosis found.  History of Present Illness:    Today we spoke to the patient and her Sister Roberta Miller.  Roberta Miller just had hip surgery.  Apparently the patient acted out recently.  She has been more aggressive and more impulsive.  Unfortunately things fell through and she never got a Tegretol blood level.  Today once again we will arrange for her to get some Tegretol blood levels and a comprehensive metabolic panel.  Patient will be seen in the office in 2 months.  Generally she is sleeping and eating well.  She drinks no alcohol uses no drugs.  She shows no evidence of psychosis.  Patient is very susceptible to other people around her.  She is quick to anger and accused Roberta Miller of stealing from her.  Roberta Miller is a very devoted person for her.  Roberta Miller is recovering from hip surgery.  But then will be seen in the office in 2 months.  We will continue her Tegretol as prescribed. Associated Signs/Symptoms: Depression Symptoms:  anhedonia, hypersomnia, anxiety, (Hypo) Manic Symptoms:   Anxiety Symptoms:   Psychotic Symptoms:   PTSD Symptoms:   Past Psychiatric History:   Previous Psychotropic Medications: Tegretol  Substance Abuse History in the last 12 months:    Consequences of Substance Abuse:   Past Medical History:  Past Medical History:  Diagnosis Date  . Allergy   . Anxiety   . Fetal alcohol syndrome 1959  . GERD (gastroesophageal reflux disease)   . Hypertension   . Pancreatitis   . Substance abuse ([place])    alcohol and tobacco  . Ulcer     Past Surgical History:  Procedure Laterality Date  . ESOPHAGOGASTRODUODENOSCOPY N/A 06/21/2016   Procedure: ESOPHAGOGASTRODUODENOSCOPY (EGD);  Surgeon: Milus Banister, MD;  Location: [place];  Service: Endoscopy;  Laterality: N/A;  . NO  PAST SURGERIES      Family Psychiatric History:   Family History:  Family History  Problem Relation Age of Onset  . Cancer Mother        died of breast cancer  . Alcoholism Mother   . Breast cancer Mother   . Hypertension Sister   . Breast cancer Sister   . Hypertension Brother   . Diabetes Brother   . Cancer - Ovarian Sister   . Colon cancer Neg Hx     Social History:   Social History   Socioeconomic History  . Marital status: Single    Spouse name: Not on file  . Number of children: Not on file  . Years of education: Not on file  . Highest education level: Not on file  Occupational History  . Not on file  Tobacco Use  . Smoking status: Current Every Day Smoker    Packs/day: 0.50    Years: 42.00    Pack years: 21.00    Types: Cigarettes  . Smokeless tobacco: Never Used  . Tobacco comment: 1/2ppd   Substance and Sexual Activity  . Alcohol use: Yes    Alcohol/week: 8.0 standard drinks    Types: 7 Cans of beer, 1 Standard drinks or equivalent per week  . Drug use: No    Comment: hx of cocaine use  . Sexual activity: Never  Other Topics Concern  . Not on file  Social History Narrative   Lives alone   Niece power of  attorney   Gets SSI   Single         Social Determinants of Health   Financial Resource Strain:   . Difficulty of Paying Living Expenses:   Food Insecurity:   . Worried About Charity fundraiser in the Last Year:   . Arboriculturist in the Last Year:   Transportation Needs:   . Film/video editor (Medical):   Marland Kitchen Lack of Transportation (Non-Medical):   Physical Activity:   . Days of Exercise per Week:   . Minutes of Exercise per Session:   Stress:   . Feeling of Stress :   Social Connections:   . Frequency of Communication with Friends and Family:   . Frequency of Social Gatherings with Friends and Family:   . Attends Religious Services:   . Active Member of Clubs or Organizations:   . Attends Archivist Meetings:   Marland Kitchen  Marital Status:     Additional Social History:   Allergies:   Allergies  Allergen Reactions  . Other Rash    Tomatoes and Chocolate     Metabolic Disorder Labs: No results found for: HGBA1C, MPG No results found for: PROLACTIN Lab Results  Component Value Date   CHOL 168 11/24/2009   TRIG 68 11/24/2009   HDL 53 11/24/2009   CHOLHDL 3.2 Ratio 11/24/2009   VLDL 14 11/24/2009   LDLCALC 101 (H) 11/24/2009   Lab Results  Component Value Date   TSH 2.210 12/24/2019    Therapeutic Level Labs: No results found for: LITHIUM Lab Results  Component Value Date   CBMZ 6.4 02/24/2015   Lab Results  Component Value Date   VALPROATE <4 (L) 12/24/2019    Current Medications: Current Outpatient Medications  Medication Sig Dispense Refill  . aspirin-acetaminophen-caffeine (EXCEDRIN MIGRAINE) 250-250-65 MG tablet Take 2 tablets by mouth every 6 (six) hours as needed for headache. 30 tablet 0  . carbamazepine (TEGRETOL) 200 MG tablet 1  Bid for 2 weeks then 2 bid 120 tablet 6  . fluticasone (FLONASE) 50 MCG/ACT nasal spray Place 1 spray into both nostrils daily. 48 g 3  . folic acid (FOLVITE) 1 MG tablet Take 1 tablet (1 mg total) by mouth daily. 30 tablet 11  . hydrochlorothiazide (HYDRODIURIL) 12.5 MG tablet Take 1 tablet (12.5 mg total) by mouth daily. 90 tablet 3  . hydrocortisone cream 1 % Apply to affected area 2 times daily 30 g 1  . nicotine (NICODERM CQ - DOSED IN MG/24 HOURS) 14 mg/24hr patch Place 1 patch (14 mg total) onto the skin daily. 21 patch 1  . nicotine polacrilex (NICORETTE) 2 MG gum Take 1 each (2 mg total) by mouth as needed for smoking cessation. 100 tablet 0  . pantoprazole (PROTONIX) 40 MG tablet Take 1 tablet (40 mg total) by mouth daily. 60 tablet 3   No current facility-administered medications for this visit.    Musculoskeletal: Strength & Muscle Tone: within normal limits Gait & Station: normal Patient leans: Right  Psychiatric Specialty  Exam: ROS  There were no vitals taken for this visit.There is no height or weight on file to calculate BMI.  General Appearance: NA  Eye Contact:  Good  Speech:  Clear and Coherent  Volume:  Normal  Mood:  Negative  Affect:  NA  Thought Process:  Goal Directed  Orientation:  NA  Thought Content:  WDL  Suicidal Thoughts:  No  Homicidal Thoughts:  No  Memory:  Negative  Judgement:  Good  Insight:  Fair  Psychomotor Activity:  NA and Normal  Concentration:    Recall:  Negative  Fund of Knowledge:Fair  Language: NA  Akathisia:  No  Handed:  Right  AIMS (if indicated):  not done  Assets:  Communication Skills  ADL's:  Intact  Cognition: WNL  Sleep:  Negative   Screenings: PHQ2-9     Office Visit from 06/03/2019 in [place] Office Visit from 03/26/2019 in [place] Office Visit from 10/09/2017 in [place] Office Visit from 03/01/2017 in [place] Office Visit from 01/05/2017 in [place]  PHQ-2 Total Score  0  0  0  0  0  PHQ-9 Total Score  0  --  --  --  --      Assessment and Plan:    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  I think it is more related to the irritability from brain injury.  Nonetheless Tegretol has done well up until recently where it seems there is some problems.  We will go ahead and get her Tegretol level right away continue her Tegretol as ordered and make her another appointment in 2 months.  Patient is not dangerous to herself or anyone else.  She denies any neurological symptoms at this time.  Jerral Ralph, MD 5/12/20212:39 PM

## 2020-03-19 ENCOUNTER — Other Ambulatory Visit (HOSPITAL_COMMUNITY): Payer: Self-pay | Admitting: *Deleted

## 2020-03-19 ENCOUNTER — Telehealth (HOSPITAL_COMMUNITY): Payer: Self-pay | Admitting: *Deleted

## 2020-03-19 DIAGNOSIS — Z79899 Other long term (current) drug therapy: Secondary | ICD-10-CM

## 2020-03-19 NOTE — Telephone Encounter (Signed)
Lab orders, CMP and Tegretol level, faxed [facility] [address] Pt niece [name] is familiar with this location.

## 2020-03-25 ENCOUNTER — Telehealth (HOSPITAL_COMMUNITY): Payer: Self-pay

## 2020-03-25 NOTE — Telephone Encounter (Signed)
Received PA for Carbamazepine 200mg . Spoke with Ebony Hail at [facility]; Tegretol does not require a PA. Tegretol has gone through at the pharmacy

## 2020-05-20 ENCOUNTER — Other Ambulatory Visit: Payer: Self-pay

## 2020-05-20 ENCOUNTER — Telehealth (INDEPENDENT_AMBULATORY_CARE_PROVIDER_SITE_OTHER): Payer: Medicaid Other | Admitting: Psychiatry

## 2020-05-20 DIAGNOSIS — F4324 Adjustment disorder with disturbance of conduct: Secondary | ICD-10-CM

## 2020-05-20 DIAGNOSIS — F02818 Dementia in other diseases classified elsewhere, unspecified severity, with other behavioral disturbance: Secondary | ICD-10-CM

## 2020-05-20 LAB — CMP14+EGFR
ALT: 11 IU/L (ref 0–32)
AST: 20 IU/L (ref 0–40)
Albumin/Globulin Ratio: 1.7 (ref 1.2–2.2)
Albumin: 4.4 g/dL (ref 3.8–4.8)
Alkaline Phosphatase: 100 IU/L (ref 48–121)
BUN/Creatinine Ratio: 16 (ref 12–28)
BUN: 16 mg/dL (ref 8–27)
Bilirubin Total: 0.3 mg/dL (ref 0.0–1.2)
CO2: 20 mmol/L (ref 20–29)
Calcium: 9.6 mg/dL (ref 8.7–10.3)
Chloride: 107 mmol/L — ABNORMAL HIGH (ref 96–106)
Creatinine, Ser: 0.97 mg/dL (ref 0.57–1.00)
GFR calc Af Amer: 73 mL/min/{1.73_m2} (ref >59)
GFR calc non Af Amer: 63 mL/min/{1.73_m2} (ref >59)
Globulin, Total: 2.6 g/dL (ref 1.5–4.5)
Glucose: 85 mg/dL (ref 65–99)
Potassium: 4 mmol/L (ref 3.5–5.2)
Sodium: 142 mmol/L (ref 134–144)
Total Protein: 7 g/dL (ref 6.0–8.5)

## 2020-05-20 LAB — CARBAMAZEPINE LEVEL, TOTAL: Carbamazepine (Tegretol), S: 7.1 ug/mL (ref 4.0–12.0)

## 2020-05-20 MED ORDER — CARBAMAZEPINE 200 MG PO TABS: ORAL_TABLET | ORAL | 6 refills | Status: DC
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
## 2020-06-25 ENCOUNTER — Other Ambulatory Visit: Payer: Self-pay | Admitting: Internal Medicine

## 2020-06-25 ENCOUNTER — Other Ambulatory Visit: Payer: Self-pay | Admitting: Family Medicine

## 2020-06-25 DIAGNOSIS — Z1231 Encounter for screening mammogram for malignant neoplasm of breast: Secondary | ICD-10-CM

## 2020-07-09 ENCOUNTER — Ambulatory Visit
Admission: RE | Admit: 2020-07-09 | Discharge: 2020-07-09 | Disposition: A | Discharge status: Home / Self Care | Payer: Medicaid Other | Source: Ambulatory Visit | Attending: Family Medicine | Admitting: Family Medicine

## 2020-07-09 ENCOUNTER — Other Ambulatory Visit: Payer: Self-pay

## 2020-07-09 DIAGNOSIS — Z1231 Encounter for screening mammogram for malignant neoplasm of breast: Secondary | ICD-10-CM

## 2020-07-15 ENCOUNTER — Other Ambulatory Visit: Payer: Self-pay | Admitting: Family Medicine

## 2020-07-15 DIAGNOSIS — R928 Other abnormal and inconclusive findings on diagnostic imaging of breast: Secondary | ICD-10-CM

## 2020-07-24 ENCOUNTER — Other Ambulatory Visit: Payer: Self-pay | Admitting: Family Medicine

## 2020-07-24 ENCOUNTER — Ambulatory Visit
Admission: RE | Admit: 2020-07-24 | Discharge: 2020-07-24 | Disposition: A | Discharge status: Home / Self Care | Payer: Medicaid Other | Source: Ambulatory Visit | Attending: Family Medicine | Admitting: Family Medicine

## 2020-07-24 ENCOUNTER — Other Ambulatory Visit: Payer: Self-pay

## 2020-07-24 DIAGNOSIS — R928 Other abnormal and inconclusive findings on diagnostic imaging of breast: Secondary | ICD-10-CM

## 2020-07-24 DIAGNOSIS — R921 Mammographic calcification found on diagnostic imaging of breast: Secondary | ICD-10-CM

## 2020-10-21 ENCOUNTER — Telehealth (HOSPITAL_COMMUNITY): Payer: Medicaid Other | Admitting: Psychiatry

## 2020-10-24 ENCOUNTER — Ambulatory Visit: Payer: Medicaid Other

## 2020-10-28 ENCOUNTER — Other Ambulatory Visit: Payer: Self-pay

## 2020-10-28 ENCOUNTER — Telehealth (INDEPENDENT_AMBULATORY_CARE_PROVIDER_SITE_OTHER): Payer: Medicaid Other | Admitting: Psychiatry

## 2020-10-28 DIAGNOSIS — F0151 Vascular dementia with behavioral disturbance: Secondary | ICD-10-CM | POA: Diagnosis not present

## 2020-10-28 DIAGNOSIS — F01518 Vascular dementia, unspecified severity, with other behavioral disturbance: Secondary | ICD-10-CM

## 2020-10-28 MED ORDER — CARBAMAZEPINE 200 MG PO TABS
ORAL_TABLET | ORAL | 6 refills | Status: DC
Start: 2020-10-28 — End: 2021-03-23
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
## 2020-11-02 ENCOUNTER — Ambulatory Visit: Payer: Medicaid Other | Attending: Internal Medicine

## 2020-11-02 ENCOUNTER — Other Ambulatory Visit: Payer: Self-pay

## 2020-11-02 DIAGNOSIS — Z23 Encounter for immunization: Secondary | ICD-10-CM

## 2020-11-02 NOTE — Progress Notes (Signed)
   Covid-19 Vaccination Clinic  Name:  Roberta Miller    MRN: [id number] DOB: 11-19-1955  11/02/2020  Roberta Miller was observed post Covid-19 immunization for 15 minutes without incident. She was provided with Vaccine Information Sheet and instruction to access the V-Safe system.   Roberta Miller was instructed to call 911 with any severe reactions post vaccine: Marland Kitchen Difficulty breathing  . Swelling of face and throat  . A fast heartbeat  . A bad rash all over body  . Dizziness and weakness   Immunizations Administered    Name Date Dose VIS Date Route   Pfizer COVID-19 Vaccine 11/02/2020  2:44 PM 0.3 mL 08/26/2020 Intramuscular   Manufacturer: [place]   Lot: [id number]   [place]: [id number]

## 2021-01-25 ENCOUNTER — Ambulatory Visit
Admission: RE | Admit: 2021-01-25 | Discharge: 2021-01-25 | Disposition: A | Discharge status: Home / Self Care | Payer: Medicaid Other | Source: Ambulatory Visit | Attending: Family Medicine | Admitting: Family Medicine

## 2021-01-25 ENCOUNTER — Other Ambulatory Visit: Payer: Self-pay | Admitting: Family Medicine

## 2021-01-25 ENCOUNTER — Other Ambulatory Visit: Payer: Self-pay

## 2021-01-25 DIAGNOSIS — R921 Mammographic calcification found on diagnostic imaging of breast: Secondary | ICD-10-CM

## 2021-03-23 ENCOUNTER — Telehealth (INDEPENDENT_AMBULATORY_CARE_PROVIDER_SITE_OTHER): Payer: Medicaid Other | Admitting: Psychiatry

## 2021-03-23 ENCOUNTER — Other Ambulatory Visit: Payer: Self-pay

## 2021-03-23 DIAGNOSIS — F3162 Bipolar disorder, current episode mixed, moderate: Secondary | ICD-10-CM

## 2021-03-23 MED ORDER — CARBAMAZEPINE 200 MG PO TABS
ORAL_TABLET | ORAL | 6 refills | Status: DC
Start: 2021-03-23 — End: 2021-08-24

## 2021-03-23 NOTE — Progress Notes (Signed)
Psychiatric Initial Adult Assessment   Patient Identification: Roberta Miller MRN:  [id number] Date of Evaluation:  03/23/2021 Referral Source: Self-referred Chief Complaint:   Visit Diagnosis: No diagnosis found.  History of Present Illness:  Today I spoke to the patient and [name].  The patient is actually doing well.  She is functioning independently.  The good news is the patient has not acted out or been aggressive or been impulsive at all.  She is not oversedated.  She takes the Tegretol as prescribed.  Physically she is healthy.  She denies any chest pain or shortness of breath or any other physical complaints.  Financially she is stable.  The patient denies being depressed or anxious.  She has no psychotic symptomatology.  The good news also she is drinking very little alcohol.  He uses no drugs.  She continues to function independently.  Her sister also is busy taking care of other family members and does a great job.  The patient will be scheduled to be seeing her primary care doctor in the next few weeks.  At that time we will get baseline blood work and include Tegretol.  The results will be sent to our office here.  The patient is not suicidal.  She is not been aggressive at all.  She is sleeping and eating well. Depression Symptoms:  anhedonia, hypersomnia, anxiety, (Hypo) Manic Symptoms:   Anxiety Symptoms:   Psychotic Symptoms:   PTSD Symptoms:   Past Psychiatric History:   Previous Psychotropic Medications: Tegretol  Substance Abuse History in the last 12 months:    Consequences of Substance Abuse:   Past Medical History:  Past Medical History:  Diagnosis Date  . Allergy   . Anxiety   . Fetal alcohol syndrome 1959  . GERD (gastroesophageal reflux disease)   . Hypertension   . Pancreatitis   . Substance abuse ([place])    alcohol and tobacco  . Ulcer     Past Surgical History:  Procedure Laterality Date  . ESOPHAGOGASTRODUODENOSCOPY N/A 06/21/2016    Procedure: ESOPHAGOGASTRODUODENOSCOPY (EGD);  Surgeon: Milus Banister, MD;  Location: [facility];  Service: Endoscopy;  Laterality: N/A;  . NO PAST SURGERIES      Family Psychiatric History:   Family History:  Family History  Problem Relation Age of Onset  . Cancer Mother        died of breast cancer  . Alcoholism Mother   . Breast cancer Mother   . Hypertension Sister   . Breast cancer Sister   . Hypertension Brother   . Diabetes Brother   . Cancer - Ovarian Sister   . Colon cancer Neg Hx     Social History:   Social History   Socioeconomic History  . Marital status: Single    Spouse name: Not on file  . Number of children: Not on file  . Years of education: Not on file  . Highest education level: Not on file  Occupational History  . Not on file  Tobacco Use  . Smoking status: Current Every Day Smoker    Packs/day: 0.50    Years: 42.00    Pack years: 21.00    Types: Cigarettes  . Smokeless tobacco: Never Used  . Tobacco comment: 1/2ppd   Substance and Sexual Activity  . Alcohol use: Yes    Alcohol/week: 8.0 standard drinks    Types: 7 Cans of beer, 1 Standard drinks or equivalent per week  . Drug use: No    Comment:  hx of cocaine use  . Sexual activity: Never  Other Topics Concern  . Not on file  Social History Narrative   Lives alone   Niece power of attorney   Gets SSI   Single         Social Determinants of Health   Financial Resource Strain: Not on file  Food Insecurity: Not on file  Transportation Needs: Not on file  Physical Activity: Not on file  Stress: Not on file  Social Connections: Not on file    Additional Social History:   Allergies:   Allergies  Allergen Reactions  . Other Rash    Tomatoes and Chocolate     Metabolic Disorder Labs: No results found for: HGBA1C, MPG No results found for: PROLACTIN Lab Results  Component Value Date   CHOL 168 11/24/2009   TRIG 68 11/24/2009   HDL 53 11/24/2009   CHOLHDL 3.2 Ratio  11/24/2009   VLDL 14 11/24/2009   LDLCALC 101 (H) 11/24/2009   Lab Results  Component Value Date   TSH 2.210 12/24/2019    Therapeutic Level Labs: No results found for: LITHIUM Lab Results  Component Value Date   CBMZ 7.1 05/19/2020   Lab Results  Component Value Date   VALPROATE <4 (L) 12/24/2019    Current Medications: Current Outpatient Medications  Medication Sig Dispense Refill  . aspirin-acetaminophen-caffeine (EXCEDRIN MIGRAINE) 250-250-65 MG tablet Take 2 tablets by mouth every 6 (six) hours as needed for headache. 30 tablet 0  . carbamazepine (TEGRETOL) 200 MG tablet 2 bid 120 tablet 6  . fluticasone (FLONASE) 50 MCG/ACT nasal spray Place 1 spray into both nostrils daily. 48 g 3  . folic acid (FOLVITE) 1 MG tablet Take 1 tablet (1 mg total) by mouth daily. 30 tablet 11  . hydrochlorothiazide (HYDRODIURIL) 12.5 MG tablet Take 1 tablet (12.5 mg total) by mouth daily. 90 tablet 3  . nicotine (NICODERM CQ - DOSED IN MG/24 HOURS) 14 mg/24hr patch Place 1 patch (14 mg total) onto the skin daily. 21 patch 1  . nicotine polacrilex (NICORETTE) 2 MG gum Take 1 each (2 mg total) by mouth as needed for smoking cessation. 100 tablet 0  . pantoprazole (PROTONIX) 40 MG tablet Take 1 tablet (40 mg total) by mouth daily. 60 tablet 3   No current facility-administered medications for this visit.    Musculoskeletal: Strength & Muscle Tone: within normal limits Gait & Station: normal Patient leans: Right  Psychiatric Specialty Exam: ROS  There were no vitals taken for this visit.There is no height or weight on file to calculate BMI.  General Appearance: NA  Eye Contact:  Good  Speech:  Clear and Coherent  Volume:  Normal  Mood:  Negative  Affect:  NA  Thought Process:  Goal Directed  Orientation:  NA  Thought Content:  WDL  Suicidal Thoughts:  No  Homicidal Thoughts:  No  Memory:  Negative  Judgement:  Good  Insight:  Fair  Psychomotor Activity:  NA and Normal   Concentration:    Recall:  Negative  Fund of Knowledge:Fair  Language: NA  Akathisia:  No  Handed:  Right  AIMS (if indicated):  not done  Assets:  Communication Skills  ADL's:  Intact  Cognition: WNL  Sleep:  Negative   Screenings: PHQ2-9   [facility] Office Visit from 06/03/2019 in [facility] Office Visit from 03/26/2019 in [facility] Office Visit from 10/09/2017 in [facility] Office Visit from 03/01/2017 in [facility] Office Visit from 01/05/2017 in [facility]  PHQ-2 Total Score 0 0 0 0 0  PHQ-9 Total Score 0 -- -- -- --      Assessment and Plan:  This patient is diagnosed with dementia with behavioral disturbance.  She takes Tegretol for irritability and explosive behavior.  On this medicine she does very well.  Her sister helps her function but the patient does all her basic ADLs without problem.  The patient will get some blood work in the next few weeks.  She will return to see me in 5 months.  Hopefully it will be in person.  Jerral Ralph, MD 5/17/20224:01 PM

## 2021-08-24 ENCOUNTER — Other Ambulatory Visit: Payer: Self-pay

## 2021-08-24 ENCOUNTER — Telehealth (HOSPITAL_BASED_OUTPATIENT_CLINIC_OR_DEPARTMENT_OTHER): Payer: Medicaid Other | Admitting: Psychiatry

## 2021-08-24 DIAGNOSIS — F0630 Mood disorder due to known physiological condition, unspecified: Secondary | ICD-10-CM

## 2021-08-24 MED ORDER — CARBAMAZEPINE 200 MG PO TABS
ORAL_TABLET | ORAL | 6 refills | Status: DC
Start: 2021-08-24 — End: 2023-04-05

## 2021-08-24 NOTE — Progress Notes (Signed)
Psychiatric Initial Adult Assessment   Patient Identification: Roberta Miller MRN:  [id number] Date of Evaluation:  08/24/2021 Referral Source: Self-referred Chief Complaint:   Visit Diagnosis: No diagnosis found.  History of Present Illness:  Today the patient is doing quite well.  We spoke to her and [name].  The patient lives independently.  She makes her own food.  The patient is functioning very well.  She rarely or occasionally sees [name] her boyfriend.  The good news is the patient has not been agitated or violent at all.  She takes her Tegretol as prescribed.  She has cut down her alcohol to only 1 beer a day.  She has no evidence of psychosis.  Her irritability is well controlled.  She uses no drugs.  The patient is actually doing well.  Her health is good.  She sees her primary care doctor regularly.  Unfortunately she did not get the blood work that we asked last time we will call her tomorrow and set up her to get some blood work.  But overall she is doing quite well. Depression Symptoms:  anhedonia, hypersomnia, anxiety, (Hypo) Manic Symptoms:   Anxiety Symptoms:   Psychotic Symptoms:   PTSD Symptoms:   Past Psychiatric History:   Previous Psychotropic Medications: Tegretol  Substance Abuse History in the last 12 months:    Consequences of Substance Abuse:   Past Medical History:  Past Medical History:  Diagnosis Date   Allergy    Anxiety    Fetal alcohol syndrome 1959   GERD (gastroesophageal reflux disease)    Hypertension    Pancreatitis    Substance abuse ([place])    alcohol and tobacco   Ulcer     Past Surgical History:  Procedure Laterality Date   ESOPHAGOGASTRODUODENOSCOPY N/A 06/21/2016   Procedure: ESOPHAGOGASTRODUODENOSCOPY (EGD);  Surgeon: Milus Banister, MD;  Location: [facility];  Service: Endoscopy;  Laterality: N/A;   NO PAST SURGERIES      Family Psychiatric History:   Family History:  Family History  Problem Relation Age of  Onset   Cancer Mother        died of breast cancer   Alcoholism Mother    Breast cancer Mother    Hypertension Sister    Breast cancer Sister    Hypertension Brother    Diabetes Brother    Cancer - Ovarian Sister    Colon cancer Neg Hx     Social History:   Social History   Socioeconomic History   Marital status: Single    Spouse name: Not on file   Number of children: Not on file   Years of education: Not on file   Highest education level: Not on file  Occupational History   Not on file  Tobacco Use   Smoking status: Every Day    Packs/day: 0.50    Years: 42.00    Pack years: 21.00    Types: Cigarettes   Smokeless tobacco: Never   Tobacco comments:    1/2ppd   Substance and Sexual Activity   Alcohol use: Yes    Alcohol/week: 8.0 standard drinks    Types: 7 Cans of beer, 1 Standard drinks or equivalent per week   Drug use: No    Comment: hx of cocaine use   Sexual activity: Never  Other Topics Concern   Not on file  Social History Narrative   Lives alone   Niece power of attorney   Gets SSI   Single  Social Determinants of Health   Financial Resource Strain: Not on file  Food Insecurity: Not on file  Transportation Needs: Not on file  Physical Activity: Not on file  Stress: Not on file  Social Connections: Not on file    Additional Social History:   Allergies:   Allergies  Allergen Reactions   Other Rash    Tomatoes and Chocolate     Metabolic Disorder Labs: No results found for: HGBA1C, MPG No results found for: PROLACTIN Lab Results  Component Value Date   CHOL 168 11/24/2009   TRIG 68 11/24/2009   HDL 53 11/24/2009   CHOLHDL 3.2 Ratio 11/24/2009   VLDL 14 11/24/2009   LDLCALC 101 (H) 11/24/2009   Lab Results  Component Value Date   TSH 2.210 12/24/2019    Therapeutic Level Labs: No results found for: LITHIUM Lab Results  Component Value Date   CBMZ 7.1 05/19/2020   Lab Results  Component Value Date   VALPROATE <4  (L) 12/24/2019    Current Medications: Current Outpatient Medications  Medication Sig Dispense Refill   aspirin-acetaminophen-caffeine (EXCEDRIN MIGRAINE) 250-250-65 MG tablet Take 2 tablets by mouth every 6 (six) hours as needed for headache. 30 tablet 0   carbamazepine (TEGRETOL) 200 MG tablet 2 bid 120 tablet 6   fluticasone (FLONASE) 50 MCG/ACT nasal spray Place 1 spray into both nostrils daily. 48 g 3   folic acid (FOLVITE) 1 MG tablet Take 1 tablet (1 mg total) by mouth daily. 30 tablet 11   hydrochlorothiazide (HYDRODIURIL) 12.5 MG tablet Take 1 tablet (12.5 mg total) by mouth daily. 90 tablet 3   nicotine (NICODERM CQ - DOSED IN MG/24 HOURS) 14 mg/24hr patch Place 1 patch (14 mg total) onto the skin daily. 21 patch 1   nicotine polacrilex (NICORETTE) 2 MG gum Take 1 each (2 mg total) by mouth as needed for smoking cessation. 100 tablet 0   pantoprazole (PROTONIX) 40 MG tablet Take 1 tablet (40 mg total) by mouth daily. 60 tablet 3   No current facility-administered medications for this visit.    Musculoskeletal: Strength & Muscle Tone: within normal limits Gait & Station: normal Patient leans: Right  Psychiatric Specialty Exam: ROS  There were no vitals taken for this visit.There is no height or weight on file to calculate BMI.  General Appearance: NA  Eye Contact:  Good  Speech:  Clear and Coherent  Volume:  Normal  Mood:  Negative  Affect:  NA  Thought Process:  Goal Directed  Orientation:  NA  Thought Content:  WDL  Suicidal Thoughts:  No  Homicidal Thoughts:  No  Memory:  Negative  Judgement:  Good  Insight:  Fair  Psychomotor Activity:  NA and Normal  Concentration:    Recall:  Negative  Fund of Knowledge:Fair  Language: NA  Akathisia:  No  Handed:  Right  AIMS (if indicated):  not done  Assets:  Communication Skills  ADL's:  Intact  Cognition: WNL  Sleep:  Negative   Screenings: PHQ2-9    [facility] Office Visit from 06/03/2019 in [facility] Office Visit from 03/26/2019 in [facility] Office Visit from 10/09/2017 in [facility] Office Visit from 03/01/2017 in [facility] Office Visit from 01/05/2017 in [facility]  PHQ-2 Total Score 0 0 0 0 0  PHQ-9 Total Score 0 -- -- -- --       Assessment  and Plan:   This patient's first problem is that of dementia with behavioral disturbance.  She does very well taking Tegretol as prescribed.  She is sleeping and eating well does exercise by taking long walks.  She is a very supportive sister who is with her today.  Patient second problem is that of alcohol abuse.  She has cut down her alcohol to only 1 beer a day and that is confirmed by her sister.  Overall this patient is doing quite well and will be seen again in 5 months.  Jerral Ralph, MD 10/18/20224:00 PM

## 2021-09-06 ENCOUNTER — Other Ambulatory Visit: Payer: Self-pay

## 2021-09-06 ENCOUNTER — Ambulatory Visit
Admission: RE | Admit: 2021-09-06 | Discharge: 2021-09-06 | Disposition: A | Discharge status: Home / Self Care | Payer: Medicaid Other | Source: Ambulatory Visit | Attending: Family Medicine | Admitting: Family Medicine

## 2021-09-06 DIAGNOSIS — R921 Mammographic calcification found on diagnostic imaging of breast: Secondary | ICD-10-CM

## 2021-11-17 ENCOUNTER — Other Ambulatory Visit: Payer: Self-pay

## 2021-11-17 ENCOUNTER — Encounter: Payer: Self-pay | Admitting: Internal Medicine

## 2021-11-17 ENCOUNTER — Ambulatory Visit: Payer: Medicaid Other | Admitting: Internal Medicine

## 2021-11-17 VITALS — BP 181/113 | HR 61 | Temp 97.8°F | Ht 64.0 in | Wt 107.5 lb

## 2021-11-17 DIAGNOSIS — F17210 Nicotine dependence, cigarettes, uncomplicated: Secondary | ICD-10-CM

## 2021-11-17 DIAGNOSIS — I10 Essential (primary) hypertension: Secondary | ICD-10-CM | POA: Diagnosis present

## 2021-11-17 DIAGNOSIS — F4324 Adjustment disorder with disturbance of conduct: Secondary | ICD-10-CM | POA: Diagnosis not present

## 2021-11-17 DIAGNOSIS — F17200 Nicotine dependence, unspecified, uncomplicated: Secondary | ICD-10-CM

## 2021-11-17 LAB — POCT GLYCOSYLATED HEMOGLOBIN (HGB A1C): Hemoglobin A1C: 5.1 % (ref 4.0–5.6)

## 2021-11-17 LAB — GLUCOSE, CAPILLARY: Glucose-Capillary: 95 mg/dL (ref 70–99)

## 2021-11-17 MED ORDER — HYDROCHLOROTHIAZIDE 12.5 MG PO TABS: 12.5000 mg | ORAL_TABLET | Freq: Every day | ORAL | 0 refills | Status: DC

## 2021-11-17 NOTE — Progress Notes (Signed)
° °  CC: Re-establish Care  HPI:  [name] is a 66 y.o. person, with a PMH noted below, who presents to the clinic to re-establish care. To see the management of their acute and chronic conditions, please see the A&P note under the Encounters tab.   Past Medical History:  Diagnosis Date   Allergy    Anxiety    Fetal alcohol syndrome 1959   GERD (gastroesophageal reflux disease)    Hypertension    Pancreatitis    Substance abuse ([place])    alcohol and tobacco   Ulcer    Review of Systems:   Review of Systems  Constitutional:  Negative for chills, diaphoresis, fever, malaise/fatigue and weight loss.  HENT:  Negative for ear discharge, ear pain, hearing loss and tinnitus.   Eyes:  Negative for blurred vision, double vision, photophobia and pain.  Skin:  Negative for itching and rash.    Physical Exam:  Vitals:   11/17/21 0855 11/17/21 0856  BP:  (!) 181/113  Pulse:  61  Temp:  97.8 F (36.6 C)  TempSrc:  Oral  SpO2:  99%  Weight: 107 lb 8 oz (48.8 kg)   Height: 5\' 4"  (1.626 m)    Physical Exam Vitals and nursing note reviewed.  Constitutional:      General: She is not in acute distress.    Appearance: Normal appearance. She is not ill-appearing or toxic-appearing.  HENT:     Head: Normocephalic and atraumatic.  Cardiovascular:     Rate and Rhythm: Normal rate and regular rhythm.     Pulses: Normal pulses.     Heart sounds: Normal heart sounds. No murmur heard.   No friction rub. No gallop.  Pulmonary:     Effort: Pulmonary effort is normal.     Breath sounds: Normal breath sounds. No wheezing, rhonchi or rales.  Abdominal:     General: Bowel sounds are normal.     Palpations: Abdomen is soft.     Tenderness: There is no abdominal tenderness. There is no guarding.  Musculoskeletal:        General: No swelling.     Right lower leg: No edema.     Left lower leg: No edema.  Skin:    General: Skin is warm and dry.     Findings: No lesion or rash.   Neurological:     General: No focal deficit present.     Mental Status: She is alert and oriented to person, place, and time.  Psychiatric:        Mood and Affect: Mood normal.        Behavior: Behavior normal.     Assessment & Plan:   See Encounters Tab for problem based charting.  Patient discussed with Dr. Jimmye Norman

## 2021-11-17 NOTE — Patient Instructions (Addendum)
To Roberta Miller,   It was a pleasure meeting you today! Today we discussed your blood pressure medications. Given that your blood pressure is elevated, we will restart your Hydrochlorothiazide 12.5 mg daily today and have you follow back with [place] in 2 weeks for a blood pressure check. Additionally, I will obtain some labs today to assess your blood sugars, cholesterol, and kidney function. We will see you in two weeks! Have a good day,  Roberta Mercury, MD

## 2021-11-17 NOTE — Assessment & Plan Note (Signed)
Vitals with BMI 11/17/2021 06/03/2019 03/26/2019  Height 5\' 4"  - -  Weight 107 lbs 8 oz 105 lbs 2 oz 107 lbs  BMI 14.48 - -  Systolic 185 631 497  Diastolic 026 92 378  Pulse 61 70 60   Patient presents with uncontrolled hypertension. She has been out of her HCTZ 12.5 for ~2 weeks. She is currently asymptomatic and denies any current vision changes, headaches, or chest pain at this time. Today we will restart her home medications and assess her BMP.  - Refilled HCTZ 12.5 mg daily - Follow up in 2 weeks

## 2021-11-18 LAB — BMP8+ANION GAP
Anion Gap: 17 mmol/L (ref 10.0–18.0)
BUN/Creatinine Ratio: 19 (ref 12–28)
BUN: 15 mg/dL (ref 8–27)
CO2: 22 mmol/L (ref 20–29)
Calcium: 9.9 mg/dL (ref 8.7–10.3)
Chloride: 105 mmol/L (ref 96–106)
Creatinine, Ser: 0.79 mg/dL (ref 0.57–1.00)
Glucose: 89 mg/dL (ref 70–99)
Potassium: 4 mmol/L (ref 3.5–5.2)
Sodium: 144 mmol/L (ref 134–144)
eGFR: 84 mL/min/{1.73_m2} (ref >59)

## 2021-11-18 LAB — LIPID PANEL
Chol/HDL Ratio: 2.7 ratio (ref 0.0–4.4)
Cholesterol, Total: 185 mg/dL (ref 100–199)
HDL: 69 mg/dL (ref >39)
LDL Chol Calc (NIH): 103 mg/dL — ABNORMAL HIGH (ref 0–99)
Triglycerides: 68 mg/dL (ref 0–149)
VLDL Cholesterol Cal: 13 mg/dL (ref 5–40)

## 2021-11-18 LAB — CBC WITH DIFFERENTIAL/PLATELET
Basophils Absolute: 0 10*3/uL (ref 0.0–0.2)
Basos: 0 %
EOS (ABSOLUTE): 0.1 10*3/uL (ref 0.0–0.4)
Eos: 1 %
Hematocrit: 46.5 % (ref 34.0–46.6)
Hemoglobin: 15.3 g/dL (ref 11.1–15.9)
Immature Grans (Abs): 0 10*3/uL (ref 0.0–0.1)
Immature Granulocytes: 0 %
Lymphocytes Absolute: 3.1 10*3/uL (ref 0.7–3.1)
Lymphs: 28 %
MCH: 30.3 pg (ref 26.6–33.0)
MCHC: 32.9 g/dL (ref 31.5–35.7)
MCV: 92 fL (ref 79–97)
Monocytes Absolute: 0.6 10*3/uL (ref 0.1–0.9)
Monocytes: 6 %
Neutrophils Absolute: 7.2 10*3/uL — ABNORMAL HIGH (ref 1.4–7.0)
Neutrophils: 65 %
Platelets: 193 10*3/uL (ref 150–450)
RBC: 5.05 x10E6/uL (ref 3.77–5.28)
RDW: 13.6 % (ref 11.7–15.4)
WBC: 11.1 10*3/uL — ABNORMAL HIGH (ref 3.4–10.8)

## 2021-11-18 NOTE — Assessment & Plan Note (Addendum)
Follows with Dr. Casimiro Needle, she is doing well on her carbamazepine. She is compliant with her medication. Of note there is a diagnosis of vascular dementia, but there is no supporting MRI/CT head in our system. This may need further exploration.   - Continue carbamazepine 200 BID - Continue to follow with Dr. Casimiro Needle

## 2021-11-18 NOTE — Assessment & Plan Note (Signed)
Continues to smoke a half pack a day. She has a 24 pack year history. She is currently working on self weaning, and declined any aids today. Discussed the health benefits of tobacco cessation today. - Continue to monitor

## 2021-11-29 NOTE — Progress Notes (Signed)
Internal Medicine Clinic Attending ? ?Case discussed with Dr. Winters  At the time of the visit.  We reviewed the resident?s history and exam and pertinent patient test results.  I agree with the assessment, diagnosis, and plan of care documented in the resident?s note.  ?

## 2021-12-06 ENCOUNTER — Encounter: Payer: Medicaid Other | Admitting: Internal Medicine

## 2022-01-19 ENCOUNTER — Telehealth (HOSPITAL_COMMUNITY): Payer: Medicaid Other | Admitting: Psychiatry

## 2022-05-29 ENCOUNTER — Other Ambulatory Visit: Payer: Self-pay | Admitting: Internal Medicine

## 2022-05-29 DIAGNOSIS — I10 Essential (primary) hypertension: Secondary | ICD-10-CM

## 2022-05-30 NOTE — Telephone Encounter (Signed)
Please have patient follow-up for blood pressure 

## 2022-07-05 ENCOUNTER — Encounter: Payer: Medicaid Other | Admitting: Internal Medicine

## 2022-07-06 ENCOUNTER — Encounter: Payer: Self-pay | Admitting: Internal Medicine

## 2023-01-17 ENCOUNTER — Other Ambulatory Visit: Payer: Self-pay | Admitting: Internal Medicine

## 2023-01-17 DIAGNOSIS — N6320 Unspecified lump in the left breast, unspecified quadrant: Secondary | ICD-10-CM

## 2023-01-27 ENCOUNTER — Ambulatory Visit: Payer: Medicaid Other | Admitting: Student

## 2023-01-27 VITALS — BP 127/90 | HR 68 | Temp 98.1°F | Ht 64.0 in | Wt 105.1 lb

## 2023-01-27 DIAGNOSIS — Z1211 Encounter for screening for malignant neoplasm of colon: Secondary | ICD-10-CM

## 2023-01-27 DIAGNOSIS — Z681 Body mass index (BMI) 19 or less, adult: Secondary | ICD-10-CM | POA: Diagnosis not present

## 2023-01-27 DIAGNOSIS — A539 Syphilis, unspecified: Secondary | ICD-10-CM | POA: Diagnosis not present

## 2023-01-27 DIAGNOSIS — F17200 Nicotine dependence, unspecified, uncomplicated: Secondary | ICD-10-CM

## 2023-01-27 DIAGNOSIS — N83201 Unspecified ovarian cyst, right side: Secondary | ICD-10-CM | POA: Diagnosis not present

## 2023-01-27 DIAGNOSIS — F17210 Nicotine dependence, cigarettes, uncomplicated: Secondary | ICD-10-CM | POA: Diagnosis not present

## 2023-01-27 DIAGNOSIS — G44209 Tension-type headache, unspecified, not intractable: Secondary | ICD-10-CM | POA: Diagnosis not present

## 2023-01-27 DIAGNOSIS — I10 Essential (primary) hypertension: Secondary | ICD-10-CM | POA: Diagnosis not present

## 2023-01-27 DIAGNOSIS — Z0000 Encounter for general adult medical examination without abnormal findings: Secondary | ICD-10-CM

## 2023-01-27 DIAGNOSIS — K279 Peptic ulcer, site unspecified, unspecified as acute or chronic, without hemorrhage or perforation: Secondary | ICD-10-CM

## 2023-01-27 MED ORDER — NICOTINE POLACRILEX 2 MG MT GUM: 2.0000 mg | CHEWING_GUM | OROMUCOSAL | 0 refills | Status: DC | PRN

## 2023-01-27 MED ORDER — NICOTINE 14 MG/24HR TD PT24: 14.0000 mg | MEDICATED_PATCH | Freq: Every day | TRANSDERMAL | 1 refills | Status: DC

## 2023-01-27 NOTE — Patient Instructions (Addendum)
It was a pleasure seeing you in clinic today  Will check your kidney labs today as well as syphilis labs  Your blood pressure is doing great today you can stop taking the hydrochlorothiazide as it is making you urinate  You had a right ovarian cyst in the past which I would like to check on with a transvaginal ultrasound  Please schedule a follow-up in the next few months for a Pap smear  Follow up in 1-3 months

## 2023-01-28 LAB — H. PYLORI BREATH TEST: H pylori Breath Test: NEGATIVE

## 2023-01-29 LAB — RPR

## 2023-01-31 LAB — BMP8+ANION GAP
Anion Gap: 19 mmol/L — ABNORMAL HIGH (ref 10.0–18.0)
BUN/Creatinine Ratio: 23 (ref 12–28)
BUN: 18 mg/dL (ref 8–27)
CO2: 22 mmol/L (ref 20–29)
Calcium: 9.8 mg/dL (ref 8.7–10.3)
Chloride: 102 mmol/L (ref 96–106)
Creatinine, Ser: 0.78 mg/dL (ref 0.57–1.00)
Glucose: 93 mg/dL (ref 70–99)
Potassium: 3.7 mmol/L (ref 3.5–5.2)
Sodium: 143 mmol/L (ref 134–144)
eGFR: 85 mL/min/{1.73_m2} (ref >59)

## 2023-01-31 LAB — RPR: RPR Ser Ql: REACTIVE — AB

## 2023-01-31 LAB — RPR, QUANT+TP ABS (REFLEX)
Rapid Plasma Reagin, Quant: 1:16 {titer} — ABNORMAL HIGH
T Pallidum Abs: REACTIVE — AB

## 2023-02-02 ENCOUNTER — Encounter: Payer: Self-pay | Admitting: Student

## 2023-02-02 DIAGNOSIS — Z681 Body mass index (BMI) 19 or less, adult: Secondary | ICD-10-CM | POA: Insufficient documentation

## 2023-02-02 NOTE — Assessment & Plan Note (Signed)
Repeat RPR today. Patient cannot recall details of when she had syphilis.

## 2023-02-02 NOTE — Progress Notes (Signed)
Established Patient Office Visit  Subjective   Patient ID: Roberta Miller, female    DOB: [birth year]  Age: 67 y.o. MRN: [id number]  No chief complaint on file.   Roberta Miller is a 67 y.o. person living with a history listed below who presents to clinic for hypertension follow up. Patient accompanied by niece today.  Please refer to problem based charting for further details and assessment and plan of current problem and chronic medical conditions.    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  Respiratory:  Negative for sputum production.   Cardiovascular:  Negative for chest pain.  Skin:  Negative for rash.  Neurological:  Negative for weakness.  Psychiatric/Behavioral:  Negative for depression. The patient is not nervous/anxious.   All other systems reviewed and are negative.     Objective:     BP (!) 127/90 (BP Location: Right Arm, Patient Position: Sitting, Cuff Size: Small)   Pulse 68   Temp 98.1 F (36.7 C) (Oral)   Ht 5\' 4"  (1.626 m)   Wt 105 lb 1.6 oz (47.7 kg)   SpO2 99%   BMI 18.04 kg/m  BP Readings from Last 3 Encounters:  01/27/23 (!) 127/90  11/17/21 (!) 181/113  06/03/19 (!) 129/92      Physical Exam Constitutional:      Comments: Sitting in chair, frail appearing  HENT:     Head: Normocephalic and atraumatic.     Mouth/Throat:     Mouth: Mucous membranes are moist.     Pharynx:  Oropharynx is clear.  Eyes:     Extraocular Movements: Extraocular movements intact.     Conjunctiva/sclera: Conjunctivae normal.     Pupils: Pupils are equal, round, and reactive to light.  Cardiovascular:     Rate and Rhythm: Normal rate and regular rhythm.     Heart sounds: No murmur heard. Pulmonary:     Effort: Pulmonary effort is normal.     Breath sounds: No rhonchi or rales.  Abdominal:     General: Abdomen is flat. Bowel sounds are normal. There is no distension.     Palpations: Abdomen is soft. There is no mass.     Tenderness: There is no abdominal tenderness.  Genitourinary:    Comments: Deferred internal pelvic exam. No adnexal mass on exam Musculoskeletal:        General: Normal range of motion.     Right lower leg: No edema.     Left lower leg: No edema.  Skin:    General: Skin is warm and dry.     Capillary Refill: Capillary refill takes less than 2 seconds.     Findings: No rash.  Neurological:     General: No focal deficit present.     Mental Status: She is alert and oriented to person, place, and time.  Psychiatric:        Mood and Affect: Mood normal.  Behavior: Behavior normal.      Results for orders placed or performed in visit on 01/27/23  BMP8+Anion Gap  Result Value Ref Range   Glucose 93 70 - 99 mg/dL   BUN 18 8 - 27 mg/dL   Creatinine, Ser 0.78 0.57 - 1.00 mg/dL   eGFR 85 >59 mL/min/1.73   BUN/Creatinine Ratio 23 12 - 28   Sodium 143 134 - 144 mmol/L   Potassium 3.7 3.5 - 5.2 mmol/L   Chloride 102 96 - 106 mmol/L   CO2 22 20 - 29 mmol/L   Anion Gap 19.0 (H) 10.0 - 18.0 mmol/L   Calcium 9.8 8.7 - 10.3 mg/dL  RPR  Result Value Ref Range   RPR Ser Ql Reactive (A) Non Reactive  H. pylori breath test  Result Value Ref Range   H pylori Breath Test Negative Negative  RPR, quant & T.pallidum antibodies  Result Value Ref Range   Rapid Plasma Reagin, Quant 1:16 (H) NonRea<1:1 titer   T Pallidum Abs Reactive (A) Non Reactive    Last  metabolic panel Lab Results  Component Value Date   GLUCOSE 93 01/27/2023   NA 143 01/27/2023   K 3.7 01/27/2023   CL 102 01/27/2023   CO2 22 01/27/2023   BUN 18 01/27/2023   CREATININE 0.78 01/27/2023   EGFR 85 01/27/2023   CALCIUM 9.8 01/27/2023   PHOS 3.1 11/09/2011   PROT 7.0 05/19/2020   ALBUMIN 4.4 05/19/2020   LABGLOB 2.6 05/19/2020   AGRATIO 1.7 05/19/2020   BILITOT 0.3 05/19/2020   ALKPHOS 100 05/19/2020   AST 20 05/19/2020   ALT 11 05/19/2020   ANIONGAP 8 06/20/2016      The 10-year ASCVD risk score (Arnett DK, et al., 2019) is: 14.2%    Assessment & Plan:   Problem List Items Addressed This Visit       Cardiovascular and Mediastinum   Essential hypertension - Primary (Chronic)    BP well controlled in office today. Has not taken her medication. BMP today. Will have her discontinue HCTZ  as she complains it causes frequent urination and often avoids taking it. Niece will help her keep a home log and bring to next OV. If elevated of medication can start on ACE/ARB      Relevant Orders   BMP8+Anion Gap (Completed)     Digestive   PUD (peptic ulcer disease)    Repeat H pylori breath test symptoms to assess for cure. Not having symptoms. Has not been on PPI for years per patient.       Relevant Orders   H. pylori breath test (Completed)     Endocrine   Right ovarian cyst--needs follow up u/s    Patient deferred pelvic exam today. No obvious adnexal mass on abdominal exam. Will get repeat transvaginal US. If persistent will need referral to GYN.       Relevant Orders   US OB Transvaginal     Other   UNSPECIFIED SYPHILIS    Repeat RPR today. Patient cannot recall details of when she had syphilis.       Relevant Orders   RPR (Completed)   HEADACHE, TENSION   Tobacco use disorder    Smoking 1/2 ppd since age 54. Out of nicotine patches and would like to try again. Refilled this. Discussed lung cancer screening with low dose CT, she and family would  like to think about this before pursing this. Will follow up at next visit.  Routine health maintenance    FIT test ordered, has mammogram scheduled on 4/29, she will schedule for PAP, deferred today      Body mass index (BMI) 19.9 or less, adult    Low BMI 18.04. Niece report patient has been thin her whole life. Weight stable since 2020. Reports good appetite, no abdominal pain, nausea, vomiting. No difficulties with eating or swallowing. Continue to monitor.      Other Visit Diagnoses     Colon cancer screening       Relevant Orders   Fecal occult blood, imunochemical       Return in about 3 months (around 04/29/2023).    Iona Beard, MD

## 2023-02-02 NOTE — Assessment & Plan Note (Signed)
Patient deferred pelvic exam today. No obvious adnexal mass on abdominal exam. Will get repeat transvaginal US. If persistent will need referral to GYN.

## 2023-02-02 NOTE — Assessment & Plan Note (Signed)
BP well controlled in office today. Has not taken her medication. BMP today. Will have her discontinue HCTZ  as she complains it causes frequent urination and often avoids taking it. Niece will help her keep a home log and bring to next OV. If elevated of medication can start on ACE/ARB

## 2023-02-02 NOTE — Assessment & Plan Note (Signed)
Low BMI 18.04. Niece report patient has been thin her whole life. Weight stable since 2020. Reports good appetite, no abdominal pain, nausea, vomiting. No difficulties with eating or swallowing. Continue to monitor.

## 2023-02-02 NOTE — Assessment & Plan Note (Signed)
Smoking 1/2 ppd since age 67. Out of nicotine patches and would like to try again. Refilled this. Discussed lung cancer screening with low dose CT, she and family would like to think about this before pursing this. Will follow up at next visit.

## 2023-02-02 NOTE — Assessment & Plan Note (Signed)
FIT test ordered, has mammogram scheduled on 4/29, she will schedule for PAP, deferred today

## 2023-02-02 NOTE — Assessment & Plan Note (Signed)
Repeat H pylori breath test symptoms to assess for cure. Not having symptoms. Has not been on PPI for years per patient.

## 2023-02-08 NOTE — Progress Notes (Signed)
Internal Medicine Clinic Attending  Case discussed with Dr. Lisabeth Devoid  At the time of the visit.  We reviewed the resident's history and exam and pertinent patient test results.  I agree with the assessment, diagnosis, and plan of care documented in the resident's note.  Roberta Miller has had sporadic infrequent f/u in our clinic.  Hx of syphilis noted today on problem list, dates back to prior to 2010.  Details unavailable, though it was reported to a historical provide (Dr. Amil Amen, 2011) that she had been treated for this, with oral therapy.  Important to better understand her status. She is asymptomatic.  Discuss with ID specialist.

## 2023-03-06 ENCOUNTER — Other Ambulatory Visit: Payer: 59

## 2023-03-06 ENCOUNTER — Ambulatory Visit
Admission: RE | Admit: 2023-03-06 | Discharge: 2023-03-06 | Disposition: A | Discharge status: Home / Self Care | Payer: 59 | Source: Ambulatory Visit | Attending: Internal Medicine | Admitting: Internal Medicine

## 2023-03-06 DIAGNOSIS — Z1211 Encounter for screening for malignant neoplasm of colon: Secondary | ICD-10-CM

## 2023-03-06 DIAGNOSIS — N6320 Unspecified lump in the left breast, unspecified quadrant: Secondary | ICD-10-CM

## 2023-03-08 LAB — FECAL OCCULT BLOOD, IMMUNOCHEMICAL: Fecal Occult Bld: NEGATIVE

## 2023-03-22 ENCOUNTER — Ambulatory Visit (HOSPITAL_BASED_OUTPATIENT_CLINIC_OR_DEPARTMENT_OTHER)
Admission: RE | Admit: 2023-03-22 | Discharge: 2023-03-22 | Disposition: A | Discharge status: Home / Self Care | Payer: 59 | Source: Ambulatory Visit | Attending: Internal Medicine | Admitting: Internal Medicine

## 2023-03-22 DIAGNOSIS — N83201 Unspecified ovarian cyst, right side: Secondary | ICD-10-CM | POA: Insufficient documentation

## 2023-03-29 ENCOUNTER — Other Ambulatory Visit: Payer: Self-pay | Admitting: Student

## 2023-03-30 ENCOUNTER — Other Ambulatory Visit: Payer: Self-pay | Admitting: Student

## 2023-03-30 DIAGNOSIS — N83201 Unspecified ovarian cyst, right side: Secondary | ICD-10-CM

## 2023-04-02 IMAGING — MG DIGITAL DIAGNOSTIC BILAT W/ TOMO W/ CAD
8 of 13 series · 8 of 29 positions shown · non-contrast
Comparison: Previous exam(s).

CLINICAL DATA: Short-term follow-up for likely benign left breast
calcifications.

EXAM:
DIGITAL DIAGNOSTIC BILATERAL MAMMOGRAM WITH TOMOSYNTHESIS AND CAD
TECHNIQUE: Bilateral digital diagnostic mammography and breast tomosynthesis
was performed. The images were evaluated with computer-aided
detection.

[L CC (1 of 3)]
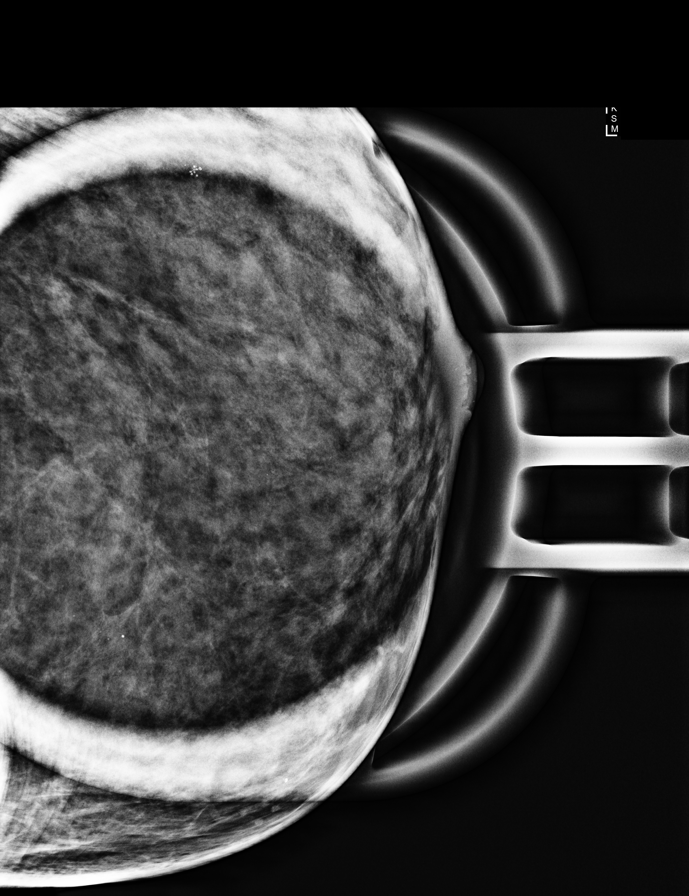

[L CC (2 of 3)]
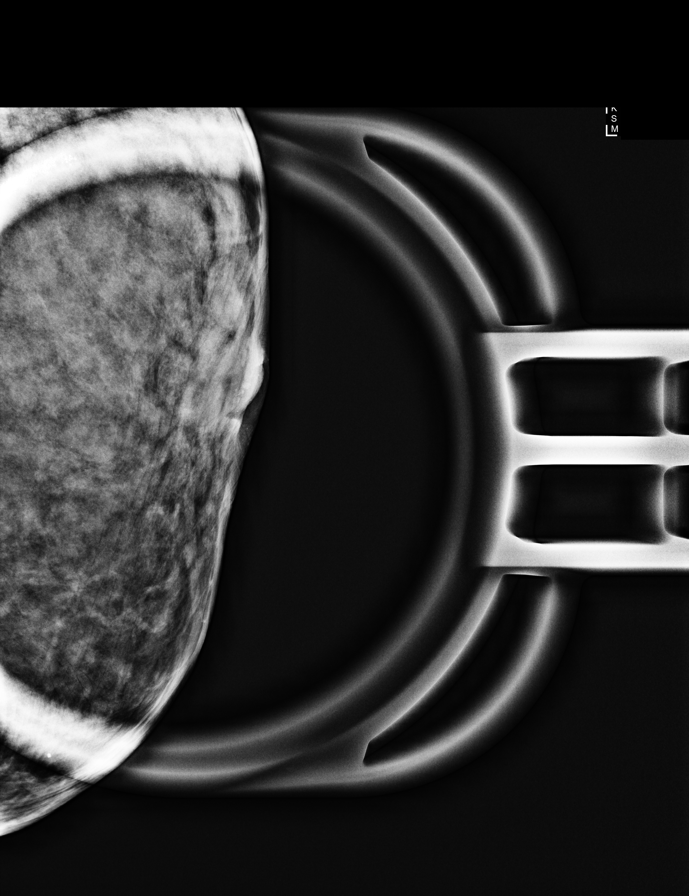

[L ML (1 of 2)]
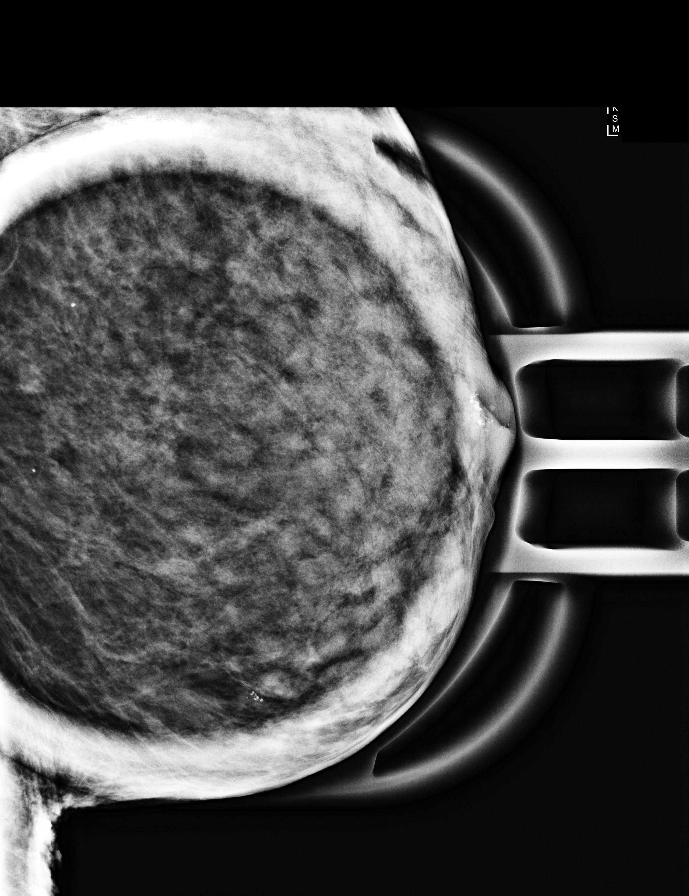

[L ML (2 of 2)]
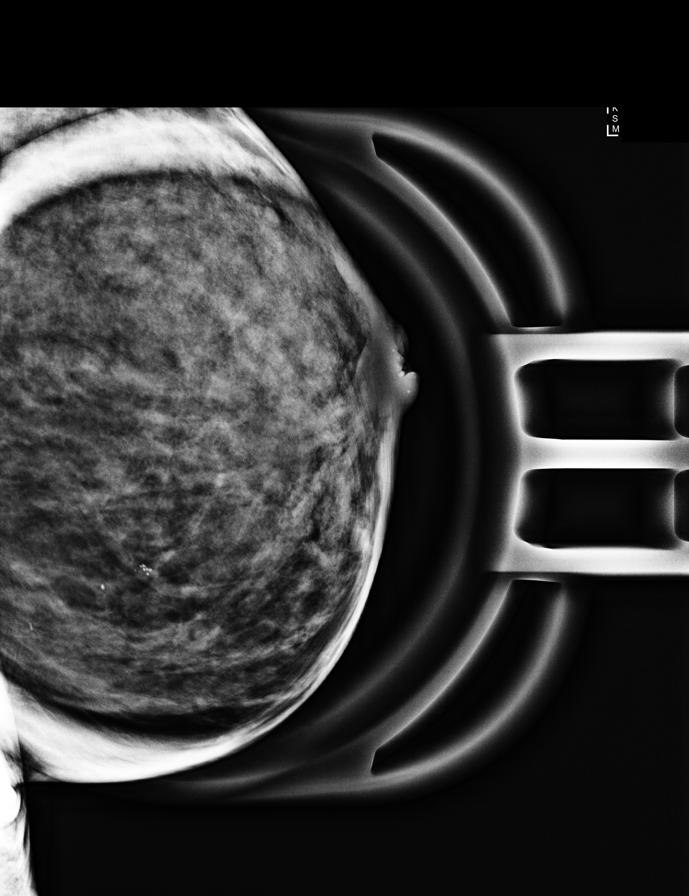

[L CC (3 of 3)]
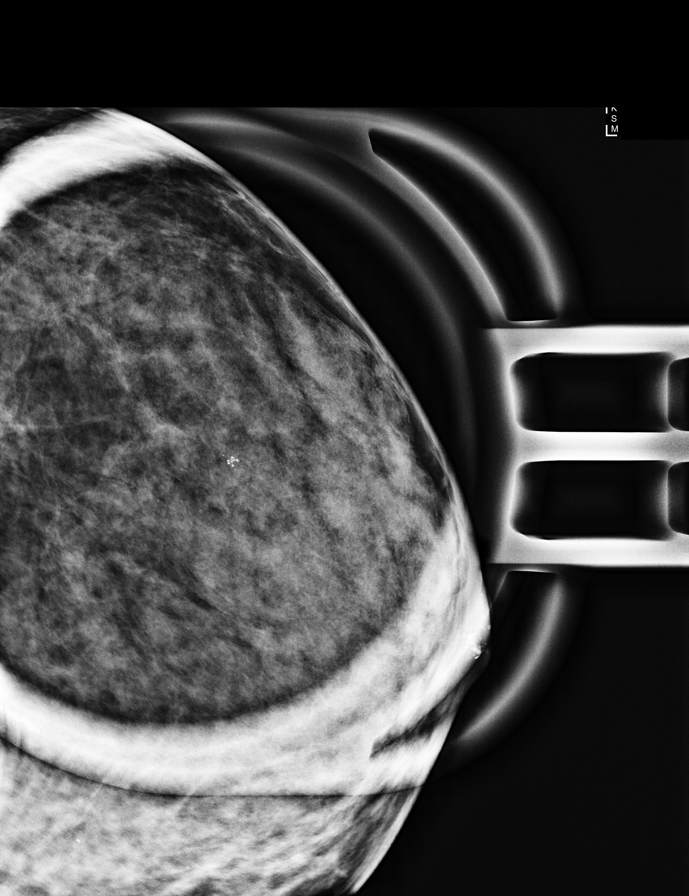

[R CC synth-2D]
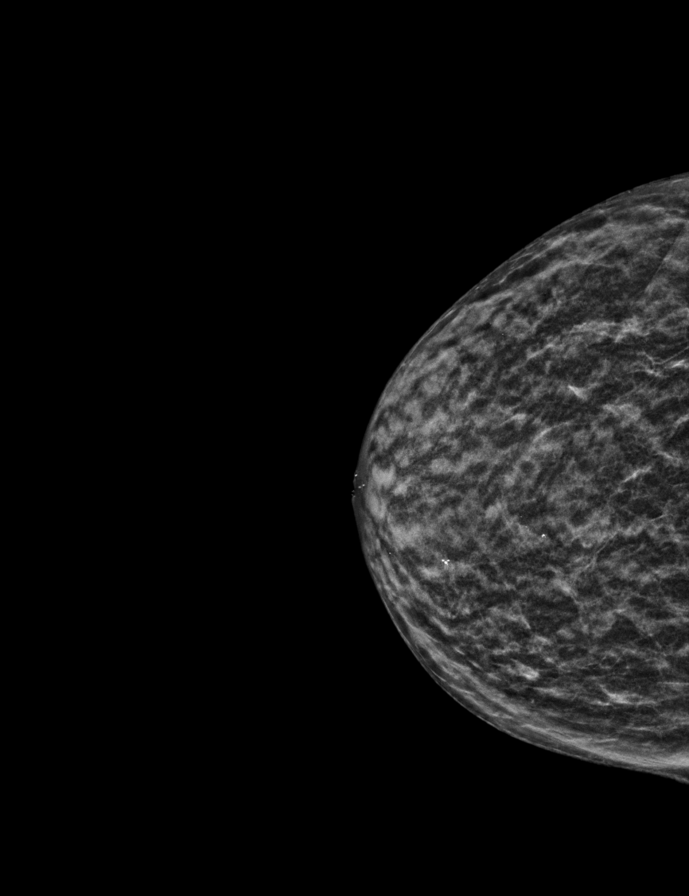

[L MLO synth-2D]
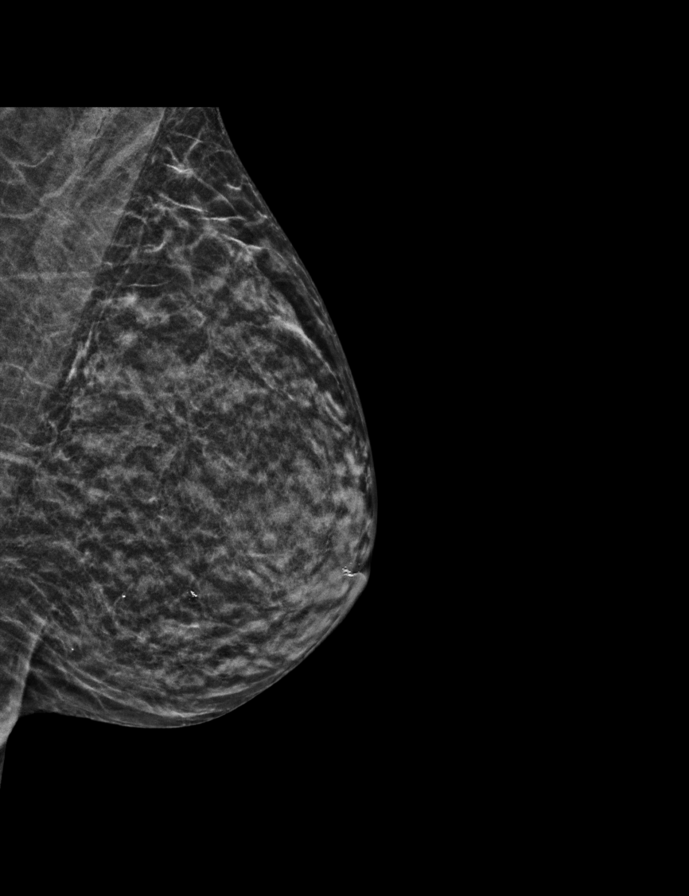

[L CC synth-2D]
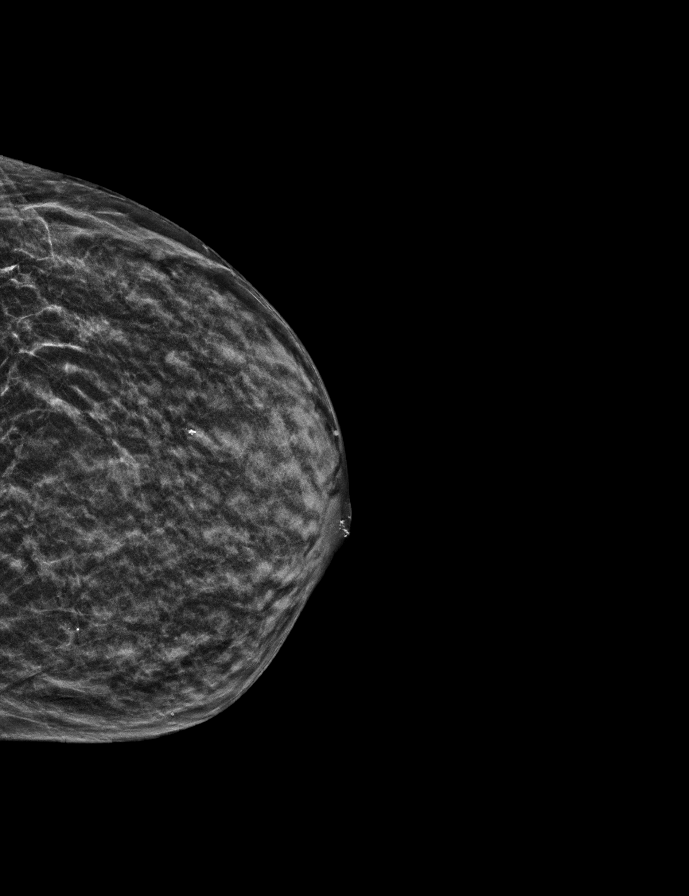

[8 of 29 positions shown; findings below may reference images not displayed]

ACR Breast Density Category c: The breast tissue is heterogeneously
dense, which may obscure small masses.
FINDINGS: The calcifications in the lateral anterior left breast have become
more coarse. They have a similar appearance to a small group of
stable calcifications in the right breast. New calcium deposits are
seen in the nipples bilaterally. On physical exam, crusty debris is
seen within the patient's inverted nipples bilaterally. This debris
was cleaned out, and a repeat magnified image of the retroareolar
left breast was performed demonstrating significant decrease in the
amount of calcium deposits initially seen. The remainder appear very
superficial, compatible with the residual debris thick could not be
thoroughly removed.
IMPRESSION: 1. No suspicious interval change in the small group of
calcifications in the lateral left breast.

2.  No mammographic evidence of malignancy in the bilateral breasts.

RECOMMENDATION:
Diagnostic mammogram is suggested in 1 year. (Code:IX-[id number])

I have discussed the findings and recommendations with the patient.
If applicable, a reminder letter will be sent to the patient
regarding the next appointment.

BI-RADS CATEGORY  3: Probably benign.

## 2023-04-05 ENCOUNTER — Ambulatory Visit (HOSPITAL_BASED_OUTPATIENT_CLINIC_OR_DEPARTMENT_OTHER): Payer: 59 | Admitting: Psychiatry

## 2023-04-05 DIAGNOSIS — F3130 Bipolar disorder, current episode depressed, mild or moderate severity, unspecified: Secondary | ICD-10-CM

## 2023-04-05 MED ORDER — CARBAMAZEPINE 200 MG PO TABS: ORAL_TABLET | ORAL | 6 refills | Status: DC

## 2023-04-05 NOTE — Progress Notes (Signed)
Psychiatric Initial Adult Assessment   Patient Identification: Roberta Miller MRN:  [id number] Date of Evaluation:  04/05/2023 Referral Source: self Chief Complaint:   Visit Diagnosis: Substance use disorder/bipolar  History of Present Illness:    Associated Signs/Symptoms: Depression Symptoms:  fatigue, (Hypo) Manic Symptoms:  Distractibility, Anxiety Symptoms:   Psychotic Symptoms:   PTSD Symptoms: NA  Past Psychiatric History:   Previous Psychotropic Medications: Yes   Substance Abuse History in the last 12 months:  No.  Consequences of Substance Abuse: NA  Past Medical History:  Past Medical History:  Diagnosis Date   Allergy    Anxiety    Fetal alcohol syndrome 1959   GERD (gastroesophageal reflux disease)    Hypertension    Pancreatitis    Substance abuse (HCC)    alcohol and tobacco   Ulcer     Past Surgical History:  Procedure Laterality Date   ESOPHAGOGASTRODUODENOSCOPY N/A 06/21/2016   Procedure: ESOPHAGOGASTRODUODENOSCOPY (EGD);  Surgeon: Rachael Fee, MD;  Location: [facility] ENDOSCOPY;  Service: Endoscopy;  Laterality: N/A;   NO PAST SURGERIES      Family Psychiatric History:   Family History:  Family History  Problem Relation Age of Onset   Cancer Mother        died of breast cancer   Alcoholism Mother    Breast cancer Mother    Hypertension Sister    Breast cancer Sister    Hypertension Brother    Diabetes Brother    Cancer - Ovarian Sister    Colon cancer Neg Hx     Social History:   Social History   Socioeconomic History   Marital status: Single    Spouse name: Not on file   Number of children: Not on file   Years of education: Not on file   Highest education level: Not on file  Occupational History   Not on file  Tobacco Use   Smoking status: Every Day    Packs/day: 0.50    Years: 42.00    Additional pack years: 0.00    Total pack years: 21.00    Types: Cigarettes   Smokeless tobacco: Never   Tobacco comments:    1/2ppd    Substance and Sexual Activity   Alcohol use: Yes    Alcohol/week: 2.0 standard drinks of alcohol    Types: 1 Cans of beer, 1 Standard drinks or equivalent per week   Drug use: No    Comment: hx of cocaine use   Sexual activity: Never  Other Topics Concern   Not on file  Social History Narrative   Lives alone   Niece power of attorney   Gets SSI   Single         Social Determinants of Health   Financial Resource Strain: Not on file  Food Insecurity: Not on file  Transportation Needs: Not on file  Physical Activity: Not on file  Stress: Not on file  Social Connections: Not on file    Additional Social History:   Allergies:   Allergies  Allergen Reactions   Other Rash    Tomatoes and Chocolate     Metabolic Disorder Labs: Lab Results  Component Value Date   HGBA1C 5.1 11/17/2021   No results found for: "PROLACTIN" Lab Results  Component Value Date   CHOL 185 11/17/2021   TRIG 68 11/17/2021   HDL 69 11/17/2021   CHOLHDL 2.7 11/17/2021   VLDL 14 11/24/2009   LDLCALC 103 (H) 11/17/2021   LDLCALC 101 (H)  11/24/2009   Lab Results  Component Value Date   TSH 2.210 12/24/2019    Therapeutic Level Labs: No results found for: "LITHIUM" Lab Results  Component Value Date   CBMZ 7.1 05/19/2020   Lab Results  Component Value Date   VALPROATE <4 (L) 12/24/2019    Current Medications: Current Outpatient Medications  Medication Sig Dispense Refill   aspirin-acetaminophen-caffeine (EXCEDRIN MIGRAINE) 250-250-65 MG tablet Take 2 tablets by mouth every 6 (six) hours as needed for headache. 30 tablet 0   carbamazepine (TEGRETOL) 200 MG tablet 2 bid 120 tablet 6   fluticasone (FLONASE) 50 MCG/ACT nasal spray Place 1 spray into both nostrils daily. 48 g 3   folic acid (FOLVITE) 1 MG tablet Take 1 tablet (1 mg total) by mouth daily. 30 tablet 11   hydrochlorothiazide (HYDRODIURIL) 12.5 MG tablet TAKE 1 TABLET BY MOUTH EVERY DAY 90 tablet 2   nicotine (NICODERM CQ  - DOSED IN MG/24 HOURS) 14 mg/24hr patch Place 1 patch (14 mg total) onto the skin daily. 21 patch 1   nicotine polacrilex (NICORETTE) 2 MG gum Take 1 each (2 mg total) by mouth as needed for smoking cessation. 100 tablet 0   No current facility-administered medications for this visit.    Musculoskeletal: Strength & Muscle Tone: within normal limits Gait & Station: normal Patient leans: N/A  Psychiatric Specialty Exam: Review of Systems  There were no vitals taken for this visit.There is no height or weight on file to calculate BMI.  General Appearance: Casual  Eye Contact:  Good  Speech:  NA  Volume:  Normal  Mood:  Negative  Affect:  Appropriate  Thought Process:  Goal Directed  Orientation:  Full (Time, Place, and Person)  Thought Content:  Logical  Suicidal Thoughts:  No  Homicidal Thoughts:  No  Memory:  NA  Judgement:  Fair  Insight:  Fair  Psychomotor Activity:  Psychomotor Retardation  Concentration:    Recall:  Good  Fund of Knowledge:Good  Language: Fair  Akathisia:  No  Handed:  Right  AIMS (if indicated):  not done  Assets:  Desire for Improvement  ADL's:  Intact  Cognition: WNL  Sleep:  Good   Screenings: PHQ2-9    Flowsheet Row Office Visit from 01/27/2023 in [facility] Office Visit from 11/17/2021 in [facility] Office Visit from 06/03/2019 in [facility] Office Visit from 03/26/2019 in [facility] Office Visit from 10/09/2017 in [facility]  PHQ-2 Total Score 0 0 0 0 0  PHQ-9 Total Score -- 1 0 -- --       Assessment and Plan:    This patient is well-known to me.  She has a history of intense alcohol use.  She has history of mood instability and has been placed on Tegretol many years ago.  She done extremely well.  In the past with Tegretol she was hostile and got in trouble with the law.  Her alcohol use has dropped off  dramatically.  She drinks 1 or 2 drinks of alcohol a week.  She takes her Tegretol rarely.  She has not been seen in 2 years only because of transportation issues.  She is doing very well and she will come back to see me in 3 months. Collaboration of Care:   Patient/Guardian was advised Release of Information must be obtained prior to any record release in order to collaborate their care  with an outside provider. Patient/Guardian was advised if they have not already done so to contact the registration department to sign all necessary forms in order for [place] to release information regarding their care.   Consent: Patient/Guardian gives verbal consent for treatment and assignment of benefits for services provided during this visit. Patient/Guardian expressed understanding and agreed to proceed.   Gypsy Balsam, MD 5/29/20243:19 PM

## 2023-04-12 ENCOUNTER — Telehealth (HOSPITAL_COMMUNITY): Payer: Self-pay

## 2023-04-12 NOTE — Telephone Encounter (Signed)
PA received for patients Carbamazepine 200 mg 1 po bid. I called [place] [employer] and got authorization. Spoke with Idelle Leech [id number]. Auth number for medication is [id number] good until 04/10/2025. For future reference, insurance prefers brand not generic.

## 2023-04-27 ENCOUNTER — Ambulatory Visit: Payer: Medicaid Other | Admitting: Internal Medicine

## 2023-04-27 ENCOUNTER — Encounter: Payer: Self-pay | Admitting: Internal Medicine

## 2023-04-27 VITALS — BP 162/102 | HR 67 | Ht 64.0 in | Wt 100.8 lb

## 2023-04-27 DIAGNOSIS — I10 Essential (primary) hypertension: Secondary | ICD-10-CM

## 2023-04-27 DIAGNOSIS — A539 Syphilis, unspecified: Secondary | ICD-10-CM

## 2023-04-27 DIAGNOSIS — N83201 Unspecified ovarian cyst, right side: Secondary | ICD-10-CM | POA: Diagnosis not present

## 2023-04-27 DIAGNOSIS — R238 Other skin changes: Secondary | ICD-10-CM

## 2023-04-27 DIAGNOSIS — L988 Other specified disorders of the skin and subcutaneous tissue: Secondary | ICD-10-CM | POA: Diagnosis not present

## 2023-04-27 MED ORDER — TRIAMCINOLONE ACETONIDE 0.1 % EX CREA
1.00 | TOPICAL_CREAM | Freq: Two times a day (BID) | CUTANEOUS | 0 refills | Status: DC
Start: 2023-04-27 — End: 2023-05-16

## 2023-04-27 MED ORDER — OLMESARTAN MEDOXOMIL 20 MG PO TABS
20.0000 mg | ORAL_TABLET | Freq: Every day | ORAL | 11 refills | Status: DC
Start: 2023-04-27 — End: 2023-05-16

## 2023-04-27 MED ORDER — AMLODIPINE BESYLATE 5 MG PO TABS
5.0000 mg | ORAL_TABLET | Freq: Every day | ORAL | 11 refills | Status: DC
Start: 2023-04-27 — End: 2023-07-26

## 2023-04-27 NOTE — Patient Instructions (Signed)
Thank you, Ms.Mishawn Wenzell for allowing [place] to provide your care today.   Blood pressure Please start taking amlodipine and olmesartan daily. Come back in 4 weeks to repeat blood pressure and blood work.  Syphilis I would like you to see [name] on July 9th to follow-up on syphilis. He will help with setting up treatment of this.  I will check in about scheduling of MRI.   I have ordered the following medication/changed the following medications:   Stop the following medications: Medications Discontinued During This Encounter  Medication Reason   hydrochlorothiazide (HYDRODIURIL) 12.5 MG tablet    nicotine (NICODERM CQ - DOSED IN MG/24 HOURS) 14 mg/24hr patch      Start the following medications: Meds ordered this encounter  Medications   amLODipine (NORVASC) 5 MG tablet    Sig: Take 1 tablet (5 mg total) by mouth daily.    Dispense:  30 tablet    Refill:  11   olmesartan (BENICAR) 20 MG tablet    Sig: Take 1 tablet (20 mg total) by mouth daily.    Dispense:  30 tablet    Refill:  11     Follow up:  1 month    We look forward to seeing you next time. Please call our clinic at [phone] if you have any questions or concerns. The best time to call is Monday-Friday from 9am-4pm, but there is someone available 24/7. If after hours or the weekend, call the main hospital number and ask for the Internal Medicine Resident On-Call. If you need medication refills, please notify your pharmacy one week in advance and they will send [place] a request.   Thank you for trusting me with your care. Wishing you the best!   Rudene Christians, DO [facility]

## 2023-04-28 DIAGNOSIS — R238 Other skin changes: Secondary | ICD-10-CM | POA: Insufficient documentation

## 2023-04-28 NOTE — Progress Notes (Signed)
Subjective:  CC: syphilis, ovarian cyst  HPI:  Roberta Miller is a 67 y.o. female with a past medical history stated below and presents today for follow-up about syphilis blood work and need to schedule MRI.  She was seen in [facility] 3/24 and noted to need follow-up on prior treated syphilis and ovarian cyst that was noted on imaging several years ago. Please see problem based assessment and plan for additional details.  Past Medical History:  Diagnosis Date   Allergy    Anxiety    Fetal alcohol syndrome 1959   GERD (gastroesophageal reflux disease)    Hypertension    Pancreatitis    Substance abuse (HCC)    alcohol and tobacco   Ulcer     Current Outpatient Medications on File Prior to Visit  Medication Sig Dispense Refill   aspirin-acetaminophen-caffeine (EXCEDRIN MIGRAINE) 250-250-65 MG tablet Take 2 tablets by mouth every 6 (six) hours as needed for headache. 30 tablet 0   carbamazepine (TEGRETOL) 200 MG tablet 2 bid 120 tablet 6   fluticasone (FLONASE) 50 MCG/ACT nasal spray Place 1 spray into both nostrils daily. 48 g 3   folic acid (FOLVITE) 1 MG tablet Take 1 tablet (1 mg total) by mouth daily. 30 tablet 11   nicotine polacrilex (NICORETTE) 2 MG gum Take 1 each (2 mg total) by mouth as needed for smoking cessation. 100 tablet 0   No current facility-administered medications on file prior to visit.    Family History  Problem Relation Age of Onset   Cancer Mother        died of breast cancer   Alcoholism Mother    Breast cancer Mother    Hypertension Sister    Breast cancer Sister    Hypertension Brother    Diabetes Brother    Cancer - Ovarian Sister    Colon cancer Neg Hx     Social History   Socioeconomic History   Marital status: Single    Spouse name: Not on file   Number of children: Not on file   Years of education: Not on file   Highest education level: Not on file  Occupational History   Not on file  Tobacco Use   Smoking status: Every Day     Packs/day: 0.50    Years: 42.00    Additional pack years: 0.00    Total pack years: 21.00    Types: Cigarettes   Smokeless tobacco: Never   Tobacco comments:    1/2ppd   Substance and Sexual Activity   Alcohol use: Yes    Alcohol/week: 2.0 standard drinks of alcohol    Types: 1 Cans of beer, 1 Standard drinks or equivalent per week   Drug use: No    Comment: hx of cocaine use   Sexual activity: Never  Other Topics Concern   Not on file  Social History Narrative   Lives alone   Niece power of attorney   Gets SSI   Single         Social Determinants of Health   Financial Resource Strain: Not on file  Food Insecurity: Not on file  Transportation Needs: Not on file  Physical Activity: Not on file  Stress: Not on file  Social Connections: Not on file  Intimate Partner Violence: Not on file    Review of Systems: ROS negative except for what is noted on the assessment and plan.  Objective:   Vitals:   04/27/23 0927 04/27/23 0941  BP: Marland Kitchen)  190/104 (!) 162/102  Pulse: 70 67  SpO2: 100%   Weight: 100 lb 12.8 oz (45.7 kg)   Height: 5\' 4"  (1.626 m)     Physical Exam: Constitutional: well-appearing  Cardiovascular: regular rate and rhythm, no m/r/g Pulmonary/Chest: normal work of breathing on room air, lungs clear to auscultation bilaterally Skin: hyperpigmented macules present to left forearm      Assessment & Plan:  UNSPECIFIED SYPHILIS History of syphilis that was possibly treated in 2011 on chart review. Lab work was completed at visit 3/24 and showed RPR 1:16 with T pallidum antibody positive. She remains asymptomatic. P: Referral to 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  Right ovarian cyst--needs follow up u/s She completed transvaginal U/S to re-evaluate ovarian cyst from CT in 2013. However ovary was not visualized. MRI was ordered last month but has not been scheduled. P: Message sent to  Ms. Boone to help with coordinating imaging.  Essential hypertension BP Readings from Last 3 Encounters:  04/27/23 (!) 162/102  01/27/23 (!) 127/90  11/17/21 (!) 181/113    Blood pressure elevated at 190/104 to 162/102. She previously took hydrochlorothiazide but did not like how much it made her go to the bathroom. P: Start amlodipine 5 mg and olmesartan 20 mg F/u July 9th to recheck BP and BMP  Papules 2 papules present to left forearm. Her niece reports that they have been there for years and seem to stay about the same size. Patient notes that over last few months lesions have become itchy. On exam 2 papules present to left forearm with surrounding excoriations.  A: chronic pruritic skin lesions, this could be consistent with lichen simplex chronicus.  P: Triamcinolone 0.1% cream  Patient discussed with Dr. Josetta Huddle Roberta Miller, D.O. [facility]  PGY-2 Pager: [phone]  Phone: [phone] Date 04/28/2023  Time 9:21 AM

## 2023-04-28 NOTE — Assessment & Plan Note (Signed)
History of syphilis that was possibly treated in 2011 on chart review. Lab work was completed at visit 3/24 and showed RPR 1:16 with T pallidum antibody positive. She remains asymptomatic. P: Referral to 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

## 2023-04-28 NOTE — Assessment & Plan Note (Signed)
2 papules present to left forearm. Her niece reports that they have been there for years and seem to stay about the same size. Patient notes that over last few months lesions have become itchy. On exam 2 papules present to left forearm with surrounding excoriations.  A: chronic pruritic skin lesions, this could be consistent with lichen simplex chronicus.  P: Triamcinolone 0.1% cream

## 2023-04-28 NOTE — Assessment & Plan Note (Signed)
BP Readings from Last 3 Encounters:  04/27/23 (!) 162/102  01/27/23 (!) 127/90  11/17/21 (!) 181/113    Blood pressure elevated at 190/104 to 162/102. She previously took hydrochlorothiazide but did not like how much it made her go to the bathroom. P: Start amlodipine 5 mg and olmesartan 20 mg F/u July 9th to recheck BP and BMP

## 2023-04-28 NOTE — Assessment & Plan Note (Signed)
She completed transvaginal U/S to re-evaluate ovarian cyst from CT in 2013. However ovary was not visualized. MRI was ordered last month but has not been scheduled. P: Message sent to Roberta Miller to help with coordinating imaging.

## 2023-05-01 NOTE — Progress Notes (Signed)
Internal Medicine Clinic Attending  I saw and evaluated the patient.  I personally confirmed the key portions of the history and exam documented by Dr. Masters and I reviewed pertinent patient test results.  The assessment, diagnosis, and plan were formulated together and I agree with the documentation in the resident's note.  

## 2023-05-10 ENCOUNTER — Ambulatory Visit (HOSPITAL_COMMUNITY)
Admission: RE | Admit: 2023-05-10 | Discharge: 2023-05-10 | Disposition: A | Discharge status: Home / Self Care | Payer: MEDICAID | Source: Ambulatory Visit | Attending: Internal Medicine | Admitting: Internal Medicine

## 2023-05-10 DIAGNOSIS — N83201 Unspecified ovarian cyst, right side: Secondary | ICD-10-CM | POA: Insufficient documentation

## 2023-05-10 MED ORDER — GADOBUTROL 1 MMOL/ML IV SOLN
4.5000 mL | Freq: Once | INTRAVENOUS | Status: AC | PRN
  Administered 2023-05-10: 4.5 mL via INTRAVENOUS

## 2023-05-16 ENCOUNTER — Ambulatory Visit (INDEPENDENT_AMBULATORY_CARE_PROVIDER_SITE_OTHER): Payer: MEDICAID | Admitting: Internal Medicine

## 2023-05-16 ENCOUNTER — Ambulatory Visit: Payer: MEDICAID | Admitting: Infectious Diseases

## 2023-05-16 ENCOUNTER — Encounter: Payer: Self-pay | Admitting: Infectious Diseases

## 2023-05-16 ENCOUNTER — Encounter: Payer: Self-pay | Admitting: Internal Medicine

## 2023-05-16 VITALS — BP 146/90 | HR 60 | Temp 98.1°F | Ht 64.0 in | Wt 102.6 lb

## 2023-05-16 VITALS — BP 145/90 | HR 60 | Temp 98.1°F | Wt 102.6 lb

## 2023-05-16 DIAGNOSIS — F17210 Nicotine dependence, cigarettes, uncomplicated: Secondary | ICD-10-CM | POA: Diagnosis not present

## 2023-05-16 DIAGNOSIS — N83201 Unspecified ovarian cyst, right side: Secondary | ICD-10-CM

## 2023-05-16 DIAGNOSIS — L988 Other specified disorders of the skin and subcutaneous tissue: Secondary | ICD-10-CM

## 2023-05-16 DIAGNOSIS — F17200 Nicotine dependence, unspecified, uncomplicated: Secondary | ICD-10-CM

## 2023-05-16 DIAGNOSIS — I10 Essential (primary) hypertension: Secondary | ICD-10-CM

## 2023-05-16 DIAGNOSIS — A539 Syphilis, unspecified: Secondary | ICD-10-CM

## 2023-05-16 MED ORDER — OLMESARTAN MEDOXOMIL 40 MG PO TABS
40.0000 mg | ORAL_TABLET | Freq: Every day | ORAL | 11 refills | Status: DC
Start: 2023-05-16 — End: 2023-12-06

## 2023-05-16 MED ORDER — PENICILLIN G BENZATHINE 2400000 UNIT/4ML IM SUSY
2.40 10*6.[IU] | PREFILLED_SYRINGE | Freq: Once | INTRAMUSCULAR | Status: AC
Start: 2023-05-16 — End: 2023-05-16
  Administered 2023-05-16: 2400000 [IU] via INTRAMUSCULAR

## 2023-05-16 MED ORDER — TRIAMCINOLONE ACETONIDE 0.1 % EX CREA: 1.0000 | TOPICAL_CREAM | Freq: Two times a day (BID) | CUTANEOUS | 0 refills | Status: DC

## 2023-05-16 NOTE — Progress Notes (Unsigned)
Subjective:  CC: blood pressure follow-up  HPI:  [name] is a 67 y.o. female with a past medical history stated below and presents today for follow-up on blood pressure. Please see problem based assessment and plan for additional details.  Past Medical History:  Diagnosis Date   Allergy    Anxiety    Fetal alcohol syndrome 1959   GERD (gastroesophageal reflux disease)    Hypertension    Pancreatitis    Substance abuse (HCC)    alcohol and tobacco   Ulcer     Current Outpatient Medications on File Prior to Visit  Medication Sig Dispense Refill   amLODipine (NORVASC) 5 MG tablet Take 1 tablet (5 mg total) by mouth daily. 30 tablet 11   aspirin-acetaminophen-caffeine (EXCEDRIN MIGRAINE) 250-250-65 MG tablet Take 2 tablets by mouth every 6 (six) hours as needed for headache. 30 tablet 0   carbamazepine (TEGRETOL) 200 MG tablet 2 bid 120 tablet 6   fluticasone (FLONASE) 50 MCG/ACT nasal spray Place 1 spray into both nostrils daily. 48 g 3   folic acid (FOLVITE) 1 MG tablet Take 1 tablet (1 mg total) by mouth daily. 30 tablet 11   nicotine polacrilex (NICORETTE) 2 MG gum Take 1 each (2 mg total) by mouth as needed for smoking cessation. 100 tablet 0   No current facility-administered medications on file prior to visit.    Family History  Problem Relation Age of Onset   Cancer Mother        died of breast cancer   Alcoholism Mother    Breast cancer Mother    Cancer Father    Hypertension Sister    Breast cancer Sister    Cancer - Ovarian Sister    Hypertension Brother    Diabetes Brother    Colon cancer Neg Hx     Social History   Socioeconomic History   Marital status: Single    Spouse name: Not on file   Number of children: Not on file   Years of education: Not on file   Highest education level: Not on file  Occupational History   Not on file  Tobacco Use   Smoking status: Every Day    Packs/day: 0.50    Years: 42.00    Additional pack years: 0.00     Total pack years: 21.00    Types: Cigarettes   Smokeless tobacco: Never   Tobacco comments:    1/2ppd   Substance and Sexual Activity   Alcohol use: Yes    Alcohol/week: 2.0 standard drinks of alcohol    Types: 1 Cans of beer, 1 Standard drinks or equivalent per week   Drug use: No    Comment: hx of cocaine use   Sexual activity: Never  Other Topics Concern   Not on file  Social History Narrative   Lives alone   Niece power of attorney   Gets SSI   Single         Social Determinants of Health   Financial Resource Strain: Not on file  Food Insecurity: Not on file  Transportation Needs: Not on file  Physical Activity: Not on file  Stress: Not on file  Social Connections: Not on file  Intimate Partner Violence: Not on file    Review of Systems: ROS negative except for what is noted on the assessment and plan.  Objective:   Vitals:   05/16/23 1427 05/16/23 1445  BP: (!) 146/92 (!) 146/90  Pulse: (!) 59 60  Temp: 98.1 F (36.7 C)   TempSrc: Oral   SpO2: 100%   Weight: 102 lb 9.6 oz (46.5 kg)   Height: 5\' 4"  (1.626 m)     Physical Exam: Constitutional: well-appearing  Cardiovascular: regular rate and rhythm, no m/r/g Pulmonary/Chest: normal work of breathing on room air, lungs clear to auscultation bilaterally Abdominal: soft, non-tender, non-distended MSK: normal bulk and tone Skin: warm and dry   Assessment & Plan:  Essential hypertension Blood pressure is elevated at 146/92 and improved to 146/90 on repeat. She reports adherence to amlodipine 5 mg and olmesartan 20 mg.  P: Repeat BMP Increase olmesartan to 40mg  Continue amlodipine 5 mg F/u in 4 weeks  Right ovarian cyst--needs follow up u/s MRI completed as transvaginal U/S did not show ovary. MRI showed no adnexal or pelvic masses. There was some bladder wall thickening. Patient denies dysuria and change in frequency.   Patient discussed with Dr. Precious Bard Coye Dawood, D.O. [facility]  Internal Medicine  PGY-3 Pager: [phone]  Phone: [phone] Date 05/17/2023  Time 5:25 PM

## 2023-05-16 NOTE — Assessment & Plan Note (Signed)
Encouraged to quit. 

## 2023-05-16 NOTE — Patient Instructions (Addendum)
Thank you, Ms.Stephany Poorman for allowing [place] to provide your care today.   Your blood pressure was high with 2 medications. I am sending in higher dose of olmesartan. Continue taking amlodipine at same dose. Follow-up in 4 weeks.  Please get shingrix vaccine at your pharmacy. This is to prevent shingles. I included information about this vaccine in back of this paperwork.  Be sure to follow-up with [name]  I have ordered the following labs for you:  Lab Orders         Basic metabolic panel      Referrals ordered today:   Referral Orders  No referral(s) requested today     I have ordered the following medication/changed the following medications:   Stop the following medications: Medications Discontinued During This Encounter  Medication Reason   triamcinolone cream (KENALOG) 0.1 % Reorder   olmesartan (BENICAR) 20 MG tablet      Start the following medications: Meds ordered this encounter  Medications   triamcinolone cream (KENALOG) 0.1 %    Sig: Apply 1 Application topically 2 (two) times daily.    Dispense:  30 g    Refill:  0   olmesartan (BENICAR) 40 MG tablet    Sig: Take 1 tablet (40 mg total) by mouth daily.    Dispense:  30 tablet    Refill:  11     Follow up:  4 weeks    We look forward to seeing you next time. Please call our clinic at [phone] if you have any questions or concerns. The best time to call is Monday-Friday from 9am-4pm, but there is someone available 24/7. If after hours or the weekend, call the main hospital number and ask for the Internal Medicine Resident On-Call. If you need medication refills, please notify your pharmacy one week in advance and they will send [place] a request.   Thank you for trusting me with your care. Wishing you the best!   Rudene Christians, DO [facility]

## 2023-05-16 NOTE — Assessment & Plan Note (Signed)
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  Will also screen her for HIV and Hepatitis.  She will return weekly x 2 then in 4 weeks for f/u with me.

## 2023-05-16 NOTE — Progress Notes (Signed)
   Subjective:    Patient ID: Roberta Miller, female  DOB: [birth year], 67 y.o.        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 She was treated with doxy for 2 weeks at that time.  She had repeat test done 01-2023 1:16.  She is here with family member today who states her care was provided by another family member at that time.  She denies vaginal or oral sores.  She has had not recent memory changes.   1/3 ppd- not ready to quit.  Mammo this year.    Health Maintenance  Topic Date Due  . Colonoscopy  Never done  . Lung Cancer Screening  Never done  . Zoster Vaccines- Shingrix (1 of 2) Never done  . PAP SMEAR-Modifier  12/14/2021  . COVID-19 Vaccine (4 - 2023-24 season) 07/08/2022  . INFLUENZA VACCINE  06/08/2023  . COLON CANCER SCREENING ANNUAL FOBT  02/26/2024  . MAMMOGRAM  03/05/2025  . DTaP/Tdap/Td (3 - Td or Tdap) 01/06/2027  . Hepatitis C Screening  Completed  . HIV Screening  Completed  . HPV VACCINES  Aged Out      Review of Systems  Constitutional:  Negative for chills, fever and weight loss.  Eyes:  Negative for blurred vision.  Respiratory:  Negative for cough and shortness of breath.   Gastrointestinal:  Negative for abdominal pain, constipation and diarrhea.  Genitourinary:  Negative for dysuria.    Please see HPI. All other systems reviewed and negative.     Objective:  Physical Exam Vitals reviewed.  Constitutional:      Appearance: Normal appearance.  HENT:     Mouth/Throat:     Mouth: Mucous membranes are moist.  Eyes:     Extraocular Movements: Extraocular movements intact.     Pupils: Pupils are equal, round, and reactive to light.  Cardiovascular:     Rate and Rhythm: Normal rate and regular rhythm.  Pulmonary:     Effort: Pulmonary effort is normal.     Breath sounds: Normal breath sounds.  Abdominal:     General: Bowel sounds are normal. There is no distension.     Palpations: Abdomen is soft.     Tenderness: There  is no abdominal tenderness.  Musculoskeletal:     Right lower leg: No edema.     Left lower leg: No edema.  Skin:    Findings: No rash.  Neurological:     General: No focal deficit present.     Mental Status: She is alert.  Psychiatric:        Mood and Affect: Mood normal.          Assessment & Plan:

## 2023-05-17 LAB — BASIC METABOLIC PANEL
BUN/Creatinine Ratio: 22 (ref 12–28)
BUN: 16 mg/dL (ref 8–27)
CO2: 18 mmol/L — ABNORMAL LOW (ref 20–29)
Calcium: 10.1 mg/dL (ref 8.7–10.3)
Chloride: 101 mmol/L (ref 96–106)
Creatinine, Ser: 0.72 mg/dL (ref 0.57–1.00)
Glucose: 88 mg/dL (ref 70–99)
Potassium: 4.4 mmol/L (ref 3.5–5.2)
Sodium: 140 mmol/L (ref 134–144)
eGFR: 93 mL/min/{1.73_m2} (ref >59)

## 2023-05-17 LAB — HEPATITIS A ANTIBODY, TOTAL: hep A Total Ab: POSITIVE — AB

## 2023-05-17 LAB — HEPATITIS C ANTIBODY: Hep C Virus Ab: NONREACTIVE

## 2023-05-17 LAB — HIV ANTIBODY (ROUTINE TESTING W REFLEX): HIV Screen 4th Generation wRfx: NONREACTIVE

## 2023-05-17 LAB — HEPATITIS B SURFACE ANTIBODY,QUALITATIVE: Hep B Surface Ab, Qual: REACTIVE

## 2023-05-17 LAB — HEPATITIS B SURFACE ANTIGEN: Hepatitis B Surface Ag: NEGATIVE

## 2023-05-17 NOTE — Assessment & Plan Note (Addendum)
Blood pressure is elevated at 146/92 and improved to 146/90 on repeat. She reports adherence to amlodipine 5 mg and olmesartan 20 mg.  P: Repeat BMP Increase olmesartan to 40mg  Continue amlodipine 5 mg F/u in 4 weeks

## 2023-05-17 NOTE — Assessment & Plan Note (Signed)
MRI completed as transvaginal U/S did not show ovary. MRI showed no adnexal or pelvic masses. There was some bladder wall thickening. Patient denies dysuria and change in frequency.

## 2023-05-18 NOTE — Progress Notes (Signed)
Internal Medicine Clinic Attending  Case discussed with Dr. Masters  At the time of the visit.  We reviewed the resident's history and exam and pertinent patient test results.  I agree with the assessment, diagnosis, and plan of care documented in the resident's note.  

## 2023-05-23 ENCOUNTER — Ambulatory Visit: Payer: MEDICAID | Admitting: *Deleted

## 2023-05-23 DIAGNOSIS — A539 Syphilis, unspecified: Secondary | ICD-10-CM

## 2023-05-23 MED ORDER — PENICILLIN G BENZATHINE 2400000 UNIT/4ML IM SUSY
2.4000 10*6.[IU] | PREFILLED_SYRINGE | Freq: Once | INTRAMUSCULAR | Status: AC
Start: 2023-05-23 — End: 2023-05-23
  Administered 2023-05-23: 2400000 [IU] via INTRAMUSCULAR

## 2023-05-23 NOTE — Progress Notes (Signed)
#  2 out 3 injections given.

## 2023-05-30 ENCOUNTER — Ambulatory Visit: Payer: MEDICAID

## 2023-05-30 DIAGNOSIS — A539 Syphilis, unspecified: Secondary | ICD-10-CM | POA: Diagnosis not present

## 2023-05-30 MED ORDER — PENICILLIN G BENZATHINE 2400000 UNIT/4ML IM SUSY
2.4000 10*6.[IU] | PREFILLED_SYRINGE | Freq: Once | INTRAMUSCULAR | Status: AC
Start: 2023-05-30 — End: 2023-05-30
  Administered 2023-05-30: 2400000 [IU] via INTRAMUSCULAR

## 2023-06-08 NOTE — Progress Notes (Signed)
Patient called. Spoke with Niece who denied patient's recent reports of suprapubic tenderness, urinary frequency or dysuria. She remember discussing these results with another provider.   Patient will follow up with [name] in ~4 weeks

## 2023-07-11 ENCOUNTER — Ambulatory Visit (HOSPITAL_COMMUNITY): Payer: 59 | Admitting: Psychiatry

## 2023-07-13 ENCOUNTER — Other Ambulatory Visit: Payer: Self-pay | Admitting: Internal Medicine

## 2023-07-13 DIAGNOSIS — L988 Other specified disorders of the skin and subcutaneous tissue: Secondary | ICD-10-CM

## 2023-07-24 ENCOUNTER — Encounter: Payer: Self-pay | Admitting: Internal Medicine

## 2023-07-24 ENCOUNTER — Ambulatory Visit: Payer: MEDICAID | Admitting: Internal Medicine

## 2023-07-24 ENCOUNTER — Other Ambulatory Visit: Payer: Self-pay

## 2023-07-24 VITALS — BP 166/97 | HR 61 | Temp 98.2°F | Ht 64.0 in | Wt 102.4 lb

## 2023-07-24 DIAGNOSIS — R238 Other skin changes: Secondary | ICD-10-CM | POA: Diagnosis not present

## 2023-07-24 DIAGNOSIS — A539 Syphilis, unspecified: Secondary | ICD-10-CM

## 2023-07-24 DIAGNOSIS — F17210 Nicotine dependence, cigarettes, uncomplicated: Secondary | ICD-10-CM

## 2023-07-24 DIAGNOSIS — F17200 Nicotine dependence, unspecified, uncomplicated: Secondary | ICD-10-CM

## 2023-07-24 DIAGNOSIS — Z0000 Encounter for general adult medical examination without abnormal findings: Secondary | ICD-10-CM

## 2023-07-24 DIAGNOSIS — I10 Essential (primary) hypertension: Secondary | ICD-10-CM

## 2023-07-24 NOTE — Patient Instructions (Signed)
Ms.Tate Miyasato, it was a pleasure seeing you today! You endorsed feeling well today. Below are some of the things we talked about this visit. We look forward to seeing you in the follow up appointment!  Today we discussed: For your blood pressure, we will check your kidney function and add another medication.  For your skin lesion, I will refer you to dermatology. We will call to schedule you with [name] for repeat testing as you don't need repeat testing at this time.   I have ordered the following labs today:   Lab Orders         BMP8+Anion Gap       Referrals ordered today:   Referral Orders  No referral(s) requested today     I have ordered the following medication/changed the following medications:   Stop the following medications: Medications Discontinued During This Encounter  Medication Reason   folic acid (FOLVITE) 1 MG tablet Patient has not taken in last 30 days     Start the following medications: No orders of the defined types were placed in this encounter.    Follow-up: 1 month follow up   Please make sure to arrive 15 minutes prior to your next appointment. If you arrive late, you may be asked to reschedule.   We look forward to seeing you next time. Please call our clinic at [phone] if you have any questions or concerns. The best time to call is Monday-Friday from 9am-4pm, but there is someone available 24/7. If after hours or the weekend, call the main hospital number and ask for the Internal Medicine Resident On-Call. If you need medication refills, please notify your pharmacy one week in advance and they will send [place] a request.  Thank you for letting us take part in your care. Wishing you the best!  Thank you, Gwenevere Abbot, MD

## 2023-07-24 NOTE — Progress Notes (Unsigned)
   CC: HTN follow up  HPI:  [name] is a 67 y.o. with medical history of HTN, GERD, alcohol use disorder presenting to [facility] for HTN follow up.  Please see problem-based list for further details, assessments, and plans.  Past Medical History:  Diagnosis Date   Allergy    Anxiety    Fetal alcohol syndrome 1959   GERD (gastroesophageal reflux disease)    HEADACHE, TENSION 08/02/2007   Qualifier: Diagnosis of   By: Roberta Alfred MD, Miller         Hypertension    Nephrolithiasis 11/09/2011   CT abdomen 11/2011:  IMPRESSION:   1. Tiny (1-2 mm) non-obstructing calculi in the upper pole calyces   of the left kidney. No urinary tract calculi elsewhere on either   side.            Pancreatitis    PUD (peptic ulcer disease) 06/20/2016   Substance abuse (HCC)    alcohol and tobacco   Ulcer      Current Outpatient Medications (Cardiovascular):    amLODipine (NORVASC) 5 MG tablet, Take 1 tablet (5 mg total) by mouth daily.   olmesartan (BENICAR) 40 MG tablet, Take 1 tablet (40 mg total) by mouth daily.  Current Outpatient Medications (Respiratory):    fluticasone (FLONASE) 50 MCG/ACT nasal spray, Place 1 spray into both nostrils daily.  Current Outpatient Medications (Analgesics):    aspirin-acetaminophen-caffeine (EXCEDRIN MIGRAINE) 250-250-65 MG tablet, Take 2 tablets by mouth every 6 (six) hours as needed for headache.   Current Outpatient Medications (Other):    carbamazepine (TEGRETOL) 200 MG tablet, 2 bid   nicotine polacrilex (NICORETTE) 2 MG gum, Take 1 each (2 mg total) by mouth as needed for smoking cessation.   triamcinolone cream (KENALOG) 0.1 %, APPLY TO AFFECTED AREA TWICE A DAY  Review of Systems:  Review of system negative unless stated in the problem list or HPI.    Physical Exam:  Vitals:   07/24/23 1028 07/24/23 1128  BP: (!) 161/96 (!) 166/97  Pulse: 60 61  Temp: 98.2 F (36.8 C)   TempSrc: Oral   SpO2: 100%   Weight: 102 lb 6.4 oz (46.4 kg)    Height: 5\' 4"  (1.626 m)    Physical Exam General: NAD HENT: NCAT Lungs: CTAB, no wheeze, rhonchi or rales.  Cardiovascular: Normal heart sounds, no r/m/g, 2+ pulses in all extremities. No LE edema Abdomen: No TTP, normal bowel sounds MSK: No asymmetry or muscle atrophy.  Skin: no lesions noted on exposed skin Neuro: Alert and oriented x4. CN grossly intact Psych: Normal mood and normal affect   Assessment & Plan:   No problem-specific Assessment & Plan notes found for this encounter.   See Encounters Tab for problem based charting.  Patient Discussed with Dr. {NAMES:[id number]::"Roberta Miller","Roberta Miller","Roberta Miller","Roberta Miller","Roberta Miller","Roberta Miller","Roberta Miller","Roberta Miller","Roberta Miller"} Gwenevere Abbot, MD Eligha Bridegroom. [facility], PGY-3   HTN Olmesartan 40 mg every day, 5 amlodipine. Not checking BP at home. Chart review shows missed refills. Restarted medications about 6 days.   Tobacco use disorder Still not interested.   Syphillis 3 IM penicillin G given. 07/09,07/16, 07/23.

## 2023-07-25 LAB — BMP8+ANION GAP
Anion Gap: 16 mmol/L (ref 10.0–18.0)
BUN/Creatinine Ratio: 19 (ref 12–28)
BUN: 14 mg/dL (ref 8–27)
CO2: 23 mmol/L (ref 20–29)
Calcium: 9.7 mg/dL (ref 8.7–10.3)
Chloride: 103 mmol/L (ref 96–106)
Creatinine, Ser: 0.72 mg/dL (ref 0.57–1.00)
Glucose: 77 mg/dL (ref 70–99)
Potassium: 3.4 mmol/L — ABNORMAL LOW (ref 3.5–5.2)
Sodium: 142 mmol/L (ref 134–144)
eGFR: 93 mL/min/{1.73_m2} (ref >59)

## 2023-07-25 NOTE — Assessment & Plan Note (Signed)
Declined pap smear even after I told her it would be her last one if it was normal as we don't need to screen further. Pt refused. Refusing colonoscopy at this time.

## 2023-07-25 NOTE — Assessment & Plan Note (Addendum)
Noted by Dr. Sloan Leiter during the previous visit. They are unchanged with triamcinolone 0.1 % cream (see media tab for images). She will benefit from dermatology referral for further treatment.

## 2023-07-25 NOTE — Assessment & Plan Note (Addendum)
Pt has HTN that is uncontrolled. She is taking Olmesartan 40 mg every day, amlodipine 5 mg qd. She is not checking BP at home. Chart review shows missed refills but pt states she is taking the medication every day. Niece who is present with her states she got a refill and started taking her medications about 6 days. 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 Unsure if she is adherent to her regimen regularly as she lives alone. Discussed pt or niece may need to ask her pharmacy to use pill pack for her prescriptions as it will simplify and help her niece track her medications. Niece stated she will inquire from her pharmacy. Initial plan was to add hydrochlorothiazide but given hypokalemia on BMP and previous concern for polyuria with hydrochlorothiazide, she may benefit from increase of amlodipine to 10 mg. That will keep the pill burden low. I expect her potassium to improve as she remains adherent to her Olmesartan. Will have her follow up with PCP in one month and advised to bring all medications with her. Amlodipine 10 mg sent to the pharmacy.

## 2023-07-25 NOTE — Assessment & Plan Note (Signed)
States she is not interested in tobacco cessation at this time.

## 2023-07-25 NOTE — Assessment & Plan Note (Addendum)
Pt with elevated RPR noted who was treated for late latent syphillis by [name]. She had syphilis previously and was treated per patient. She had 3 IM penicillin G Injections: 07/09,07/16, 07/23. They were requesting repeat testing but no repeat testing is currently indicated. They will need repeat testing after 6 months of completing therapies. Discussed with [name] who would like to see them at 6 month mark. Will advise pt to follow up with [name] in January 2025.

## 2023-07-26 MED ORDER — AMLODIPINE BESYLATE 10 MG PO TABS
10.0000 mg | ORAL_TABLET | Freq: Every day | ORAL | 11 refills | Status: DC
Start: 2023-07-26 — End: 2023-12-06

## 2023-07-28 NOTE — Progress Notes (Signed)
Internal Medicine Clinic Attending  Case discussed with the resident at the time of the visit.  We reviewed the resident's history and exam and pertinent patient test results.  I agree with the assessment, diagnosis, and plan of care documented in the resident's note.  

## 2023-07-28 NOTE — Addendum Note (Signed)
Addended by: Dickie La on: 07/28/2023 09:11 AM   Modules accepted: Level of Service

## 2023-08-22 ENCOUNTER — Encounter: Payer: MEDICAID | Admitting: Infectious Diseases

## 2023-09-20 ENCOUNTER — Encounter: Payer: MEDICAID | Admitting: Infectious Diseases

## 2023-10-03 ENCOUNTER — Encounter: Payer: Medicare Other | Admitting: Infectious Diseases

## 2023-11-20 ENCOUNTER — Other Ambulatory Visit: Payer: Self-pay | Admitting: Internal Medicine

## 2023-11-20 DIAGNOSIS — L988 Other specified disorders of the skin and subcutaneous tissue: Secondary | ICD-10-CM

## 2023-12-06 ENCOUNTER — Encounter: Payer: Self-pay | Admitting: Internal Medicine

## 2023-12-06 ENCOUNTER — Ambulatory Visit (INDEPENDENT_AMBULATORY_CARE_PROVIDER_SITE_OTHER): Payer: Medicare Other | Admitting: Internal Medicine

## 2023-12-06 VITALS — BP 168/114 | HR 78 | Temp 97.9°F | Ht 64.0 in | Wt 101.7 lb

## 2023-12-06 DIAGNOSIS — Z1211 Encounter for screening for malignant neoplasm of colon: Secondary | ICD-10-CM

## 2023-12-06 DIAGNOSIS — I10 Essential (primary) hypertension: Secondary | ICD-10-CM | POA: Diagnosis present

## 2023-12-06 DIAGNOSIS — A539 Syphilis, unspecified: Secondary | ICD-10-CM | POA: Diagnosis not present

## 2023-12-06 DIAGNOSIS — Z0000 Encounter for general adult medical examination without abnormal findings: Secondary | ICD-10-CM

## 2023-12-06 MED ORDER — AMLODIPINE BESYLATE 10 MG PO TABS
10.0000 mg | ORAL_TABLET | Freq: Every day | ORAL | 3 refills | Status: DC
Start: 2023-12-06 — End: 2024-04-11

## 2023-12-06 MED ORDER — OLMESARTAN MEDOXOMIL 40 MG PO TABS
40.0000 mg | ORAL_TABLET | Freq: Every day | ORAL | 3 refills | Status: DC
Start: 2023-12-06 — End: 2024-04-11

## 2023-12-06 NOTE — Patient Instructions (Addendum)
Thank you, [name] for allowing [place] to provide your care today.   Blood pressure Your blood pressure is elevated today.  Please be sure to pick up the new dose of amlodipine 10 mg.  Continue taking olmesartan 40 mg.  History of syphilis I have sent a message to the front desk to help you schedule with [name] who is our ID specialist.   Skin irrigation: Please call dermatology for follow-up-  [facility] [address] [place],  [place]  [address] Main: [phone] Fax: [phone]  I have ordered the following medication/changed the following medications:   Stop the following medications: There are no discontinued medications.   Start the following medications: No orders of the defined types were placed in this encounter.    Follow up: 1 month with [name]   We look forward to seeing you next time. Please call our clinic at [phone] if you have any questions or concerns. The best time to call is Monday-Friday from 9am-4pm, but there is someone available 24/7. If after hours or the weekend, call the main hospital number and ask for the Internal Medicine Resident On-Call. If you need medication refills, please notify your pharmacy one week in advance and they will send [place] a request.   Thank you for trusting me with your care. Wishing you the best!   Rudene Christians, DO [facility]

## 2023-12-06 NOTE — Progress Notes (Unsigned)
Subjective:  CC: Follow-up on hypertension  HPI:  [name] is a 68 y.o. female with a past medical history of fetal alcohol syndrome, alcohol use disorder, bipolar 1 disorder, hypertension, history of syphilis, tobacco use disorder who presents today for follow-up on hypertension.  When she was seen in September her blood pressure was uncontrolled with systolics greater than 160.  Refill history reflected nonadherence.  Olmesartan was continued at the same dose and amlodipine increased from 5 to 10 mg.  On review of recent refills it looks like she picked up amlodipine 5 mg this month and has not picked up the 10 mg dose.  Please see problem based assessment and plan for additional details.  Past Medical History:  Diagnosis Date   Allergy    Anxiety    Bipolar disorder (HCC)    Fetal alcohol syndrome 1959   GERD (gastroesophageal reflux disease)    HEADACHE, TENSION 08/02/2007   Qualifier: Diagnosis of   By: Delrae Alfred MD, Elizabeth         Hypertension    Nephrolithiasis 11/09/2011   CT abdomen 11/2011:  IMPRESSION:   1. Tiny (1-2 mm) non-obstructing calculi in the upper pole calyces   of the left kidney. No urinary tract calculi elsewhere on either   side.            Pancreatitis    PUD (peptic ulcer disease) 06/20/2016   Substance abuse (HCC)    alcohol and tobacco   Ulcer     MEDICATIONS:  Carbamazepine 200 mg-no refill since June Amlodipine 10 mg (has never picked up 10 mg dose) Olmesartan 40 mg  Family History  Problem Relation Age of Onset   Cancer Mother        died of breast cancer   Alcoholism Mother    Breast cancer Mother    Cancer Father    Hypertension Sister    Breast cancer Sister    Cancer - Ovarian Sister    Hypertension Brother    Diabetes Brother    Colon cancer Neg Hx     Social History   Socioeconomic History   Marital status: Single    Spouse name: Not on file   Number of children: Not on file   Years of education: Not on file    Highest education level: Not on file  Occupational History   Not on file  Tobacco Use   Smoking status: Every Day    Current packs/day: 0.50    Average packs/day: 0.5 packs/day for 42.0 years (21.0 ttl pk-yrs)    Types: Cigarettes   Smokeless tobacco: Never   Tobacco comments:    1/2ppd   Substance and Sexual Activity   Alcohol use: Yes    Alcohol/week: 2.0 standard drinks of alcohol    Types: 1 Cans of beer, 1 Standard drinks or equivalent per week   Drug use: No    Comment: hx of cocaine use   Sexual activity: Never  Other Topics Concern   Not on file  Social History Narrative   Lives alone   Roberta Miller power of attorney   Gets SSI   Single         Social Drivers of Health   Financial Resource Strain: Not on file  Food Insecurity: Not on file  Transportation Needs: Not on file  Physical Activity: Not on file  Stress: Not on file  Social Connections: Not on file  Intimate Partner Violence: Not on file    Review  of Systems: ROS negative except for what is noted on the assessment and plan.  Objective:   Vitals:   12/06/23 1054 12/06/23 1119  BP: (!) 151/92 (!) 168/114  Pulse: 73 78  Temp: 97.9 F (36.6 C)   TempSrc: Oral   SpO2: 100%   Weight: 101 lb 11.2 oz (46.1 kg)   Height: 5\' 4"  (1.626 m)     Physical Exam: Constitutional: well-appearing, in no acute distress, thin Cardiovascular: regular rate and rhythm, no m/r/g Pulmonary/Chest: normal work of breathing on room air, lungs clear to auscultation bilaterally Abdominal: soft, non-tender, non-distended MSK: normal bulk and tone Neurological: alert & oriented x 3, normal gait Skin: warm and dry  Assessment & Plan:  Essential hypertension Blood pressure elevated from 151/92 to 168/114.  Patient reports adherence with her 2 medications.  Lives alone and her Roberta Miller tries to remember to pick up her medications as able but she is also caregiver for several other individuals.  Her amlodipine was to be  increased from 5 mg to 10 mg after last office visit however it looks like pharmacy refilled old prescription.  Olmesartan 40 mg refilled. I called pharmacy and canceled amlodipine 5 mg.  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 -Sent to front desk for help with follow-up in 4 weeks for blood pressure and to follow-up with Roberta Miller.  Could consider getting lipid panel at that time if she is already getting lab work.  Patient has some needle phobia so trying to limit needle sticks needed  Routine health maintenance Declined pneumonia and flu vaccine. Cologuard ordered.  colo fit test was negative last year   Patient discussed with Dr. Marjorie Smolder Romell Miller, D.O. [facility]  PGY-3 Pager: [phone]  Phone: [phone] Date 12/07/2023  Time 9:00 AM

## 2023-12-07 NOTE — Assessment & Plan Note (Addendum)
Declined pneumonia and flu vaccine. Cologuard ordered.  colo fit test was negative last year

## 2023-12-07 NOTE — Assessment & Plan Note (Addendum)
Due for follow-up with 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 -Sent to front desk for help with follow-up in 4 weeks for blood pressure and to follow-up with [name].  Could consider getting lipid panel at that time if she is already getting lab work.  Patient has some needle phobia so trying to limit needle sticks needed

## 2023-12-07 NOTE — Progress Notes (Signed)
Internal Medicine Clinic Attending  Case discussed with the resident at the time of the visit.  We reviewed the resident's history and exam and pertinent patient test results.  I agree with the assessment, diagnosis, and plan of care documented in the resident's note.

## 2023-12-07 NOTE — Assessment & Plan Note (Signed)
Blood pressure elevated from 151/92 to 168/114.  Patient reports adherence with her 2 medications.  Lives alone and her niece tries to remember to pick up her medications as able but she is also caregiver for several other individuals.  Her amlodipine was to be increased from 5 mg to 10 mg after last office visit however it looks like pharmacy refilled old prescription.  Olmesartan 40 mg refilled. I called pharmacy and canceled amlodipine 5 mg.  P: Amlodipine 10 mg Olmesartan 40 mg Prescription sent for 90-day supply with 3 refills Follow-up in 4 weeks

## 2024-01-10 ENCOUNTER — Ambulatory Visit (INDEPENDENT_AMBULATORY_CARE_PROVIDER_SITE_OTHER): Payer: Medicare Other | Admitting: Student

## 2024-01-10 ENCOUNTER — Ambulatory Visit (INDEPENDENT_AMBULATORY_CARE_PROVIDER_SITE_OTHER): Payer: Medicare Other | Admitting: Infectious Diseases

## 2024-01-10 VITALS — BP 121/99 | HR 73 | Temp 98.2°F | Ht 64.0 in | Wt 104.2 lb

## 2024-01-10 VITALS — BP 121/99 | HR 68 | Temp 98.2°F | Ht 64.0 in | Wt 104.3 lb

## 2024-01-10 DIAGNOSIS — I10 Essential (primary) hypertension: Secondary | ICD-10-CM

## 2024-01-10 DIAGNOSIS — Z0000 Encounter for general adult medical examination without abnormal findings: Secondary | ICD-10-CM

## 2024-01-10 DIAGNOSIS — Z13820 Encounter for screening for osteoporosis: Secondary | ICD-10-CM

## 2024-01-10 DIAGNOSIS — F17210 Nicotine dependence, cigarettes, uncomplicated: Secondary | ICD-10-CM

## 2024-01-10 DIAGNOSIS — E785 Hyperlipidemia, unspecified: Secondary | ICD-10-CM

## 2024-01-10 DIAGNOSIS — F17200 Nicotine dependence, unspecified, uncomplicated: Secondary | ICD-10-CM

## 2024-01-10 DIAGNOSIS — Z122 Encounter for screening for malignant neoplasm of respiratory organs: Secondary | ICD-10-CM

## 2024-01-10 DIAGNOSIS — Z87891 Personal history of nicotine dependence: Secondary | ICD-10-CM

## 2024-01-10 DIAGNOSIS — A539 Syphilis, unspecified: Secondary | ICD-10-CM

## 2024-01-10 DIAGNOSIS — E2839 Other primary ovarian failure: Secondary | ICD-10-CM

## 2024-01-10 NOTE — Assessment & Plan Note (Signed)
 Will recheck her RPR today.  Will call her with results.  She has (hopefully) been adequately treated.  Rtc prn.

## 2024-01-10 NOTE — Progress Notes (Unsigned)
 CC: Follow-up  HPI:  [name] is a 68 y.o. female living with a history stated below and presents today for follow-up. Please see problem based assessment and plan for additional details.  Past Medical History:  Diagnosis Date   Allergy    Anxiety    Bipolar disorder (HCC)    Fetal alcohol syndrome 1959   GERD (gastroesophageal reflux disease)    HEADACHE, TENSION 08/02/2007   Qualifier: Diagnosis of   By: Delrae Alfred MD, Elizabeth         Hypertension    Nephrolithiasis 11/09/2011   CT abdomen 11/2011:  IMPRESSION:   1. Tiny (1-2 mm) non-obstructing calculi in the upper pole calyces   of the left kidney. No urinary tract calculi elsewhere on either   side.            Pancreatitis    PUD (peptic ulcer disease) 06/20/2016   Substance abuse (HCC)    alcohol and tobacco   Ulcer     Current Outpatient Medications on File Prior to Visit  Medication Sig Dispense Refill   amLODipine (NORVASC) 10 MG tablet Take 1 tablet (10 mg total) by mouth daily. 90 tablet 3   aspirin-acetaminophen-caffeine (EXCEDRIN MIGRAINE) 250-250-65 MG tablet Take 2 tablets by mouth every 6 (six) hours as needed for headache. 30 tablet 0   carbamazepine (TEGRETOL) 200 MG tablet 2 bid 120 tablet 6   fluticasone (FLONASE) 50 MCG/ACT nasal spray Place 1 spray into both nostrils daily. 48 g 3   olmesartan (BENICAR) 40 MG tablet Take 1 tablet (40 mg total) by mouth daily. 90 tablet 3   triamcinolone cream (KENALOG) 0.1 % APPLY TO AFFECTED AREA TWICE A DAY 30 g 0   No current facility-administered medications on file prior to visit.    Family History  Problem Relation Age of Onset   Cancer Mother        died of breast cancer   Alcoholism Mother    Breast cancer Mother    Cancer Father    Hypertension Sister    Breast cancer Sister    Cancer - Ovarian Sister    Hypertension Brother    Diabetes Brother    Colon cancer Neg Hx     Social History   Socioeconomic History   Marital status: Single     Spouse name: Not on file   Number of children: Not on file   Years of education: Not on file   Highest education level: Not on file  Occupational History   Not on file  Tobacco Use   Smoking status: Every Day    Current packs/day: 0.50    Average packs/day: 0.5 packs/day for 42.0 years (21.0 ttl pk-yrs)    Types: Cigarettes   Smokeless tobacco: Never   Tobacco comments:    1/2ppd   Substance and Sexual Activity   Alcohol use: Yes    Alcohol/week: 2.0 standard drinks of alcohol    Types: 1 Cans of beer, 1 Standard drinks or equivalent per week   Drug use: No    Comment: hx of cocaine use   Sexual activity: Never  Other Topics Concern   Not on file  Social History Narrative   Lives alone   Niece power of attorney   Gets SSI   Single         Social Drivers of Health   Financial Resource Strain: Not on file  Food Insecurity: Not on file  Transportation Needs: Not on file  Physical  Activity: Not on file  Stress: Not on file  Social Connections: Not on file  Intimate Partner Violence: Not on file    Review of Systems: ROS negative except for what is noted on the assessment and plan.  Vitals:   01/10/24 1310 01/10/24 1323  BP: (!) 129/99 (!) 121/99  Pulse: 68 73  Temp: 98.2 F (36.8 C)   TempSrc: Oral   SpO2: 100%   Weight: 104 lb 3.2 oz (47.3 kg)   Height: 5\' 4"  (1.626 m)     Physical Exam: Constitutional: well-appearing, sitting in chair, in no acute distress Cardiovascular: regular rate and rhythm, no m/r/g Pulmonary/Chest: normal work of breathing on room air, lungs clear to auscultation bilaterally Skin: warm and dry Psych: normal mood and behavior  Assessment & Plan:     Patient discussed with Dr. Cleda Daub  Essential hypertension BP is 129/99 and 121/99.  Managed with amlodipine 10 and olmesartan 40 which she took today.  Does not check blood pressure at home.  Denies headaches, chest pain, visual disturbances.  She was given BP log and will  return in 2 weeks for nurse visit/cuff calibration and will bring in ambulatory readings at that time. -Consider adding additional agent (e.g. HCTZ) if ambulatory readings are elevated.  Tobacco use disorder Continues to smoke 1/3 pack/day and is going to work on quitting.  I counseled her on how this affects every organ system and is contributing to her hypertension. -Lung cancer screening CT ordered  Routine health maintenance DEXA scan referral   Roberta Miller, D.O. [facility], PGY-1 Phone: [phone] Date 01/11/2024 Time 8:28 AM

## 2024-01-10 NOTE — Progress Notes (Signed)
   Subjective:    Patient ID: Roberta Miller, female  DOB: 12/30/1955, 68 y.o.        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 She was treated with doxy for 2 weeks at that time.  She had repeat test done 01-2023 1:16.  She was seen by me 05-2023 and given 3 (weekly) doses of IM Pen.   She has been doing well since then.  She denies any changes since then- no change in balance/sensation/memory.  No change in vision, has noted watery eyes.    Health Maintenance  Topic Date Due  . Medicare Annual Wellness (AWV)  Never done  . Pneumonia Vaccine 19+ Years old (1 of 2 - PCV) Never done  . Colonoscopy  Never done  . Lung Cancer Screening  Never done  . Zoster Vaccines- Shingrix (1 of 2) Never done  . Cervical Cancer Screening (HPV/Pap Cotest)  12/14/2021  . INFLUENZA VACCINE  06/08/2023  . COVID-19 Vaccine (4 - 2024-25 season) 07/09/2023  . DEXA SCAN  Never done  . COLON CANCER SCREENING ANNUAL FOBT  02/26/2024  . MAMMOGRAM  03/05/2025  . DTaP/Tdap/Td (3 - Td or Tdap) 01/06/2027  . Hepatitis C Screening  Completed  . HIV Screening  Completed  . HPV VACCINES  Aged Out      Review of Systems  Constitutional:  Negative for chills, fever and weight loss.  HENT:  Negative for sore throat.   Respiratory:  Negative for cough and shortness of breath.   Gastrointestinal:  Negative for constipation and diarrhea.  Genitourinary:  Negative for dysuria.   Please see HPI. All other systems reviewed and negative.     Objective:  Physical Exam Vitals reviewed.  Constitutional:      Appearance: Normal appearance.  HENT:     Mouth/Throat:     Mouth: Mucous membranes are moist.     Pharynx: No oropharyngeal exudate.  Eyes:     Extraocular Movements: Extraocular movements intact.     Pupils: Pupils are equal, round, and reactive to light.  Cardiovascular:     Rate and Rhythm: Normal rate and regular rhythm.  Pulmonary:     Effort: Pulmonary effort is normal.      Breath sounds: Normal breath sounds.  Abdominal:     General: Bowel sounds are normal. There is no distension.     Palpations: Abdomen is soft.     Tenderness: There is no abdominal tenderness.  Musculoskeletal:     Right lower leg: No edema.     Left lower leg: No edema.  Neurological:     General: No focal deficit present.     Mental Status: She is alert.     Sensory: No sensory deficit.  Psychiatric:        Mood and Affect: Mood normal.          Assessment & Plan:

## 2024-01-11 LAB — LIPID PANEL
Chol/HDL Ratio: 2.7 ratio (ref 0.0–4.4)
Cholesterol, Total: 188 mg/dL (ref 100–199)
HDL: 70 mg/dL (ref >39)
LDL Chol Calc (NIH): 106 mg/dL — ABNORMAL HIGH (ref 0–99)
Triglycerides: 65 mg/dL (ref 0–149)
VLDL Cholesterol Cal: 12 mg/dL (ref 5–40)

## 2024-01-11 NOTE — Assessment & Plan Note (Addendum)
 Continues to smoke 1/3 pack/day and is going to work on quitting.  I counseled her on how this affects every organ system and is contributing to her hypertension. -Lung cancer screening CT ordered

## 2024-01-11 NOTE — Assessment & Plan Note (Signed)
 DEXA scan referral

## 2024-01-11 NOTE — Assessment & Plan Note (Signed)
 BP is 129/99 and 121/99.  Managed with amlodipine 10 and olmesartan 40 which she took today.  Does not check blood pressure at home.  Denies headaches, chest pain, visual disturbances.  She was given BP log and will return in 2 weeks for nurse visit/cuff calibration and will bring in ambulatory readings at that time. -Consider adding additional agent (e.g. HCTZ) if ambulatory readings are elevated.

## 2024-01-12 ENCOUNTER — Other Ambulatory Visit: Payer: Self-pay | Admitting: Student

## 2024-01-12 MED ORDER — ROSUVASTATIN CALCIUM 10 MG PO TABS: 10.0000 mg | ORAL_TABLET | Freq: Every day | ORAL | 3 refills | Status: AC

## 2024-01-13 LAB — RPR, QUANT+TP ABS (REFLEX)
Rapid Plasma Reagin, Quant: 1:16 {titer} — ABNORMAL HIGH
T Pallidum Abs: REACTIVE — AB

## 2024-01-13 LAB — RPR: RPR Ser Ql: REACTIVE — AB

## 2024-01-15 ENCOUNTER — Telehealth: Payer: Self-pay | Admitting: Infectious Diseases

## 2024-01-15 NOTE — Telephone Encounter (Signed)
 Called pt about her repeat rpr Spoke with her family member (care giver) and explained results.

## 2024-01-16 NOTE — Addendum Note (Signed)
 Addended by: Carlynn Purl C on: 01/16/2024 04:02 PM   Modules accepted: Level of Service

## 2024-01-16 NOTE — Progress Notes (Signed)
 Internal Medicine Clinic Attending  Case discussed with the resident at the time of the visit.  We reviewed the resident's history and exam and pertinent patient test results.  I agree with the assessment, diagnosis, and plan of care documented in the resident's note.

## 2024-01-18 ENCOUNTER — Ambulatory Visit

## 2024-01-18 ENCOUNTER — Encounter: Payer: Self-pay | Admitting: Internal Medicine

## 2024-01-18 DIAGNOSIS — Z0000 Encounter for general adult medical examination without abnormal findings: Secondary | ICD-10-CM | POA: Diagnosis not present

## 2024-01-18 NOTE — Progress Notes (Signed)
 Entered in error

## 2024-01-18 NOTE — Progress Notes (Signed)
 Subjective:   Kateryn Marasigan is a 68 y.o. female who presents for an Initial Medicare Annual Wellness Visit.  Visit Complete: Virtual I connected with  Tucker Steedley on 01/18/24 by a audio enabled telemedicine application and verified that I am speaking with the correct person using two identifiers.  Patient Location: Home  Provider Location: Office/Clinic  I discussed the limitations of evaluation and management by telemedicine. The patient expressed understanding and agreed to proceed.  Vital Signs: Because this visit was a virtual/telehealth visit, some criteria may be missing or patient reported. Any vitals not documented were not able to be obtained and vitals that have been documented are patient reported.  Patient Medicare AWV questionnaire was completed by the patient on 01/18/2024; I have confirmed that all information answered by patient is correct and no changes since this date.        Objective:    There were no vitals filed for this visit. There is no height or weight on file to calculate BMI.     01/18/2024    4:13 PM 07/24/2023   10:34 AM 05/16/2023    4:50 PM 04/27/2023    9:29 AM 11/17/2021    8:59 AM 06/03/2019    1:39 PM 03/26/2019    1:44 PM  Advanced Directives  Does Patient Have a Medical Advance Directive? No No No No No No No  Would patient like information on creating a medical advance directive? No - Patient declined No - Patient declined No - Patient declined No - Patient declined No - Patient declined Yes (MAU/Ambulatory/Procedural Areas - Information given) No - Patient declined    Current Medications (verified) Outpatient Encounter Medications as of 01/18/2024  Medication Sig   amLODipine (NORVASC) 10 MG tablet Take 1 tablet (10 mg total) by mouth daily.   aspirin-acetaminophen-caffeine (EXCEDRIN MIGRAINE) 250-250-65 MG tablet Take 2 tablets by mouth every 6 (six) hours as needed for headache.   carbamazepine (TEGRETOL) 200 MG tablet 2 bid    fluticasone (FLONASE) 50 MCG/ACT nasal spray Place 1 spray into both nostrils daily.   olmesartan (BENICAR) 40 MG tablet Take 1 tablet (40 mg total) by mouth daily.   rosuvastatin (CRESTOR) 10 MG tablet Take 1 tablet (10 mg total) by mouth daily.   triamcinolone cream (KENALOG) 0.1 % APPLY TO AFFECTED AREA TWICE A DAY   No facility-administered encounter medications on file as of 01/18/2024.    Allergies (verified) Other   History: Past Medical History:  Diagnosis Date   Allergy    Anxiety    Bipolar disorder (HCC)    Fetal alcohol syndrome 1959   GERD (gastroesophageal reflux disease)    HEADACHE, TENSION 08/02/2007   Qualifier: Diagnosis of   By: Delrae Alfred MD, Elizabeth         Hypertension    Nephrolithiasis 11/09/2011   CT abdomen 11/2011:  IMPRESSION:   1. Tiny (1-2 mm) non-obstructing calculi in the upper pole calyces   of the left kidney. No urinary tract calculi elsewhere on either   side.            Pancreatitis    PUD (peptic ulcer disease) 06/20/2016   Substance abuse (HCC)    alcohol and tobacco   Ulcer    Past Surgical History:  Procedure Laterality Date   ESOPHAGOGASTRODUODENOSCOPY N/A 06/21/2016   Procedure: ESOPHAGOGASTRODUODENOSCOPY (EGD);  Surgeon: Rachael Fee, MD;  Location: [facility] END[facility]COPY;  Service: Endoscopy;  Laterality: N/A;   NO PAST SURGERIES     Family  History  Problem Relation Age of Onset   Cancer Mother        died of breast cancer   Alcoholism Mother    Breast cancer Mother    Cancer Father    Hypertension Sister    Breast cancer Sister    Cancer - Ovarian Sister    Hypertension Brother    Diabetes Brother    Colon cancer Neg Hx    Social History   Socioeconomic History   Marital status: Single    Spouse name: Not on file   Number of children: Not on file   Years of education: Not on file   Highest education level: Not on file  Occupational History   Not on file  Tobacco Use   Smoking status: Every Day    Current packs/day:  0.50    Average packs/day: 0.5 packs/day for 42.0 years (21.0 ttl pk-yrs)    Types: Cigarettes   Smokeless tobacco: Never   Tobacco comments:    1/2ppd   Substance and Sexual Activity   Alcohol use: Yes    Alcohol/week: 2.0 standard drinks of alcohol    Types: 1 Cans of beer, 1 Standard drinks or equivalent per week   Drug use: No    Comment: hx of cocaine use   Sexual activity: Never  Other Topics Concern   Not on file  Social History Narrative   Lives alone   Niece power of attorney   Gets SSI   Single         Social Drivers of Health   Financial Resource Strain: Low Risk  (01/18/2024)   Overall Financial Resource Strain (CARDIA)    Difficulty of Paying Living Expenses: Not hard at all  Food Insecurity: Food Insecurity Present (01/18/2024)   Hunger Vital Sign    Worried About Running Out of Food in the Last Year: Sometimes true    Ran Out of Food in the Last Year: Sometimes true  Transportation Needs: No Transportation Needs (01/18/2024)   PRAPARE - Administrator, Civil Service (Medical): No    Lack of Transportation (Non-Medical): No  Physical Activity: Sufficiently Active (01/18/2024)   Exercise Vital Sign    Days of Exercise per Week: 7 days    Minutes of Exercise per Session: 60 min  Stress: No Stress Concern Present (01/18/2024)   [employer] of Occupational Health - Occupational Stress Questionnaire    Feeling of Stress : Not at all  Social Connections: Socially Isolated (01/18/2024)   Social Connection and Isolation Panel [NHANES]    Frequency of Communication with Friends and Family: Twice a week    Frequency of Social Gatherings with Friends and Family: Once a week    Attends Religious Services: Never    Database administrator or Organizations: No    Attends Engineer, structural: Never    Marital Status: Never married    Tobacco Counseling Ready to quit: Yes Counseling given: No Tobacco comments: 1/2ppd    Clinical  Intake:  Pre-visit preparation completed: No  Pain : No/denies pain     Nutritional Risks: None Diabetes: No  How often do you need to have someone help you when you read instructions, pamphlets, or other written materials from your doctor or pharmacy?: 1 - Never What is the last grade level you completed in school?: 9th grade  Interpreter Needed?: No  Information entered by :: Daughter present on call.   Activities of Daily Living    01/18/2024  4:09 PM 07/24/2023   10:33 AM  In your present state of health, do you have any difficulty performing the following activities:  Hearing? 0 0  Vision? 0 0  Difficulty concentrating or making decisions? 0 0  Walking or climbing stairs? 0 0  Dressing or bathing? 0 0  Doing errands, shopping? 0 0    Patient Care Team: Masters, Florentina Addison, DO as PCP - General (Internal Medicine)  Indicate any recent Medical Services you may have received from other than [facility] providers in the past year (date may be approximate).     Assessment:   This is a routine wellness examination for Jahnyla.  Hearing/Vision screen No results found.   Goals Addressed   None   Depression Screen    01/18/2024    4:14 PM 01/10/2024    1:38 PM 12/06/2023   10:54 AM 07/24/2023   10:33 AM 05/16/2023    2:28 PM 04/27/2023    9:29 AM 01/27/2023   11:03 AM  PHQ 2/9 Scores  PHQ - 2 Score 0 0 0 0 0 0 0    Fall Risk    01/18/2024    4:13 PM 12/06/2023   10:54 AM 07/24/2023   10:33 AM 05/16/2023    4:57 PM 05/16/2023    2:27 PM  Fall Risk   Falls in the past year? 0 0 0 0 0  Number falls in past yr: 0 0 0 0 0  Injury with Fall? 0 0 0 0 0  Risk for fall due to : No Fall Risks No Fall Risks No Fall Risks No Fall Risks No Fall Risks  Follow up Falls evaluation completed;Falls prevention discussed Falls evaluation completed;Falls prevention discussed Falls prevention discussed;Falls evaluation completed Falls evaluation completed Falls evaluation completed    MEDICARE  RISK AT HOME: Medicare Risk at Home Any stairs in or around the home?: No If so, are there any without handrails?: No Home free of loose throw rugs in walkways, pet beds, electrical cords, etc?: Yes Adequate lighting in your home to reduce risk of falls?: Yes Life alert?: No Use of a cane, walker or w/c?: No Grab bars in the bathroom?: Yes Shower chair or bench in shower?: No Elevated toilet seat or a handicapped toilet?: No  TIMED UP AND GO:  Was the test performed? No    Cognitive Function:        01/18/2024    4:14 PM  6CIT Screen  What Year? 0 points  What month? 0 points  What time? 0 points  Count back from 20 0 points  Months in reverse 0 points  Repeat phrase 0 points  Total Score 0 points    Immunizations Immunization History  Administered Date(s) Administered   Influenza Split 01/09/2013   Influenza,inj,Quad PF,6+ Mos 01/05/2017   PFIZER(Purple Top)SARS-COV-2 Vaccination 02/08/2020, 03/03/2020, 11/02/2020   Td 09/07/2006   Tdap 01/05/2017    TDAP status: Up to date  Flu Vaccine status: Due, Education has been provided regarding the importance of this vaccine. Advised may receive this vaccine at local pharmacy or Health Dept. Aware to provide a copy of the vaccination record if obtained from local pharmacy or Health Dept. Verbalized acceptance and understanding.  Pneumococcal vaccine status: Due, Education has been provided regarding the importance of this vaccine. Advised may receive this vaccine at local pharmacy or Health Dept. Aware to provide a copy of the vaccination record if obtained from local pharmacy or Health Dept. Verbalized acceptance and understanding.  Covid-19 vaccine  status: Completed vaccines  Qualifies for Shingles Vaccine? No   Zostavax completed No   Shingrix Completed?: No.    Education has been provided regarding the importance of this vaccine. Patient has been advised to call insurance company to determine out of pocket expense if  they have not yet received this vaccine. Advised may also receive vaccine at local pharmacy or Health Dept. Verbalized acceptance and understanding.  Screening Tests Health Maintenance  Topic Date Due   Pneumonia Vaccine 38+ Years old (1 of 2 - PCV) Never done   Colonoscopy  Never done   Lung Cancer Screening  Never done   Zoster Vaccines- Shingrix (1 of 2) Never done   Cervical Cancer Screening (HPV/Pap Cotest)  12/14/2021   INFLUENZA VACCINE  06/08/2023   COVID-19 Vaccine (4 - 2024-25 season) 07/09/2023   DEXA SCAN  Never done   COLON CANCER SCREENING ANNUAL FOBT  02/26/2024   Medicare Annual Wellness (AWV)  01/17/2025   MAMMOGRAM  03/05/2025   DTaP/Tdap/Td (3 - Td or Tdap) 01/06/2027   Hepatitis C Screening  Completed   HIV Screening  Completed   HPV VACCINES  Aged Out    Health Maintenance  Health Maintenance Due  Topic Date Due   Pneumonia Vaccine 22+ Years old (1 of 2 - PCV) Never done   Colonoscopy  Never done   Lung Cancer Screening  Never done   Zoster Vaccines- Shingrix (1 of 2) Never done   Cervical Cancer Screening (HPV/Pap Cotest)  12/14/2021   INFLUENZA VACCINE  06/08/2023   COVID-19 Vaccine (4 - 2024-25 season) 07/09/2023   DEXA SCAN  Never done    Colorectal cancer screening: Type of screening: FOBT/FIT. Completed 02/26/2023. Repeat every 1 years  Mammogram status: Completed 03/06/2023. Repeat every year:2  Lung Cancer Screening: (Low Dose CT Chest recommended if Age 48-80 years, 20 pack-year currently smoking OR have quit w/in 15years.) does not qualify.   Lung Cancer Screening Referral: N/A  Additional Screening:  Hepatitis C Screening: does not qualify; Completed 05/17/2023  Vision Screening: Recommended annual ophthalmology exams for early detection of glaucoma and other disorders of the eye. Is the patient up to date with their annual eye exam?  No  Who is the provider or what is the name of the office in which the patient attends annual eye  exams? N/A If pt is not established with a provider, would they like to be referred to a provider to establish care? Yes .   Dental Screening: Recommended annual dental exams for proper oral hygiene   Community Resource Referral / Chronic Care Management: CRR required this visit?  No   CCM required this visit?  No     Plan:     I have personally reviewed and noted the following in the patient's chart:   Medical and social history Use of alcohol, tobacco or illicit drugs  Current medications and supplements including opioid prescriptions. Patient is not currently taking opioid prescriptions. Functional ability and status Nutritional status Physical activity Advanced directives List of other physicians Hospitalizations, surgeries, and ER visits in previous 12 months Vitals Screenings to include cognitive, depression, and falls Referrals and appointments  In addition, I have reviewed and discussed with patient certain preventive protocols, quality metrics, and best practice recommendations. A written personalized care plan for preventive services as well as general preventive health recommendations were provided to patient.     Cala Bradford, CMA   01/18/2024   After Visit Summary: (Mail) Due to this  being a telephonic visit, the after visit summary with patients personalized plan was offered to patient via mail   Nurse Notes: Face-To-Face Visit  Ms. Sposito , Thank you for taking time to come for your Medicare Wellness Visit. I appreciate your ongoing commitment to your health goals. Please review the following plan we discussed and let me know if I can assist you in the future.   These are the goals we discussed:  Goals      Blood Pressure < 140/90     Prevent Falls     Quit smoking / using tobacco        This is a list of the screening recommended for you and due dates:  Health Maintenance  Topic Date Due   Pneumonia Vaccine (1 of 2 - PCV) Never done   Colon  Cancer Screening  Never done   Screening for Lung Cancer  Never done   Zoster (Shingles) Vaccine (1 of 2) Never done   Pap with HPV screening  12/14/2021   Flu Shot  06/08/2023   COVID-19 Vaccine (4 - 2024-25 season) 07/09/2023   DEXA scan (bone density measurement)  Never done   Stool Blood Test  02/26/2024   Medicare Annual Wellness Visit  01/17/2025   Mammogram  03/05/2025   DTaP/Tdap/Td vaccine (3 - Td or Tdap) 01/06/2027   Hepatitis C Screening  Completed   HIV Screening  Completed   HPV Vaccine  Aged Out

## 2024-01-24 ENCOUNTER — Ambulatory Visit

## 2024-01-29 ENCOUNTER — Other Ambulatory Visit: Payer: Self-pay | Admitting: Internal Medicine

## 2024-01-29 DIAGNOSIS — Z1231 Encounter for screening mammogram for malignant neoplasm of breast: Secondary | ICD-10-CM

## 2024-01-31 ENCOUNTER — Ambulatory Visit

## 2024-02-02 ENCOUNTER — Telehealth: Payer: Self-pay | Admitting: *Deleted

## 2024-02-02 NOTE — Telephone Encounter (Signed)
 Appointment already scheduled breast center- MAMMOGRAM= 03-06-2024 @ 9:10 am                                                                            DEXA SCAN    = 11:00 am            reminder appointment mailed to the patient with the 75.00 no show fee information attached.

## 2024-02-08 NOTE — Progress Notes (Signed)
 Internal Medicine Attending:  I reviewed the AWV findings of the medical professional who conducted the visit. I was present in the office suite and immediately available to provide assistance and direction throughout the time the service was provided.

## 2024-02-26 ENCOUNTER — Ambulatory Visit (HOSPITAL_COMMUNITY)

## 2024-03-06 ENCOUNTER — Ambulatory Visit: Admitting: *Deleted

## 2024-03-06 ENCOUNTER — Ambulatory Visit
Admission: RE | Admit: 2024-03-06 | Discharge: 2024-03-06 | Disposition: A | Discharge status: Home / Self Care | Source: Ambulatory Visit | Attending: Pediatrics | Admitting: Pediatrics

## 2024-03-06 DIAGNOSIS — Z0130 Encounter for examination of blood pressure without abnormal findings: Secondary | ICD-10-CM

## 2024-03-06 DIAGNOSIS — Z1231 Encounter for screening mammogram for malignant neoplasm of breast: Secondary | ICD-10-CM

## 2024-03-06 NOTE — Progress Notes (Signed)
    Mitchell Ana presented today for blood pressure check. Patient is prescribed blood pressure medications and I confirmed that patient did not take their blood pressure medication prior to today's appointment. Blood pressure was taken in the usual and appropriate manner using an automated BP cuff.     Vitals:   03/06/24 1020 03/06/24 1028  BP: (!) 170/111 (!) 171/117      Results of today's visit will be routed to The [employer] Teamfor review and further management.  I talked to Dr Ancil Balzarine.Attending, about pt's BP - stated pt should take BP meds when she goes home and come back on Friday for another BP check; pt/niece were informed.

## 2024-03-08 ENCOUNTER — Ambulatory Visit: Admitting: *Deleted

## 2024-03-08 NOTE — Progress Notes (Signed)
    Roberta Miller presented today for blood pressure check. Patient is prescribed blood pressure medications and I confirmed that patient did take their blood pressure medication prior to today's appointment. Blood pressure was taken in the usual and appropriate manner using an automated BP cuff.     Vitals:   03/08/24 1017  BP: (!) 183/105      Results of today's visit will be routed to Dr. Jannice Mends  for review  and further management.   Repeat blood pressure- 177/109 P-60

## 2024-03-20 ENCOUNTER — Ambulatory Visit (HOSPITAL_COMMUNITY): Payer: Self-pay | Admitting: Psychiatry

## 2024-03-22 NOTE — Progress Notes (Signed)
 Spoke with Roberta Miller about Roberta Miller's blood pressure and she states they haven't been checking her blood pressure at home. Discussed the need to do this and advised to call the clinic and set up a BP nurse visit to titrate her medications. Per the caregiver, Roberta Miller was only taking olmesartan  and only started taking both of her medications this week. Plan was to start hydrochlorothiazide  if remains elevated next week.

## 2024-03-27 ENCOUNTER — Ambulatory Visit (HOSPITAL_BASED_OUTPATIENT_CLINIC_OR_DEPARTMENT_OTHER): Payer: Self-pay | Admitting: Psychiatry

## 2024-03-27 DIAGNOSIS — F3110 Bipolar disorder, current episode manic without psychotic features, unspecified: Secondary | ICD-10-CM

## 2024-03-27 MED ORDER — CARBAMAZEPINE 200 MG PO TABS: ORAL_TABLET | ORAL | 6 refills | Status: DC

## 2024-03-27 NOTE — Progress Notes (Signed)
 Psychiatric Initial Adult Assessment   Patient Identification: Roberta Miller MRN:  [id number] Date of Evaluation:  03/27/2024 Referral Source: self Chief Complaint:   Visit Diagnosis: Substance use disorder/bipolar  History of Present Illness:      Today the patient is seen with I believe it is her sister.  The patient is actually doing well.  She lives independently.  According to the patient and the person who is with her she has had no violent behavior.  She has no evidence of mania.  Her irritability is very low.  She is not oversedated.  She has hypertension and is followed closely in the outpatient medical clinic.  She has not been seen here in almost a year.  She takes her Tegretol  just as prescribed.  Patient has very little alcohol.  She uses no drugs.  She has no evidence of psychosis.  She has no evidence of euphoria or irritability.  Her mood is stable and even.  She is not grandiose. Associated Signs/Symptoms: Depression Symptoms:  fatigue, (Hypo) Manic Symptoms:  Distractibility, Anxiety Symptoms:   Psychotic Symptoms:   PTSD Symptoms: NA  Past Psychiatric History:   Previous Psychotropic Medications: Yes   Substance Abuse History in the last 12 months:  No.  Consequences of Substance Abuse: NA  Past Medical History:  Past Medical History:  Diagnosis Date   Allergy    Anxiety    Bipolar disorder (HCC)    Fetal alcohol syndrome 1959   GERD (gastroesophageal reflux disease)    HEADACHE, TENSION 08/02/2007   Qualifier: Diagnosis of   By: Jayne Mews MD, Elizabeth         Hypertension    Nephrolithiasis 11/09/2011   CT abdomen 11/2011:  IMPRESSION:   1. Tiny (1-2 mm) non-obstructing calculi in the upper pole calyces   of the left kidney. No urinary tract calculi elsewhere on either   side.            Pancreatitis    PUD (peptic ulcer disease) 06/20/2016   Substance abuse (HCC)    alcohol and tobacco   Ulcer     Past Surgical History:  Procedure Laterality Date    ESOPHAGOGASTRODUODENOSCOPY N/A 06/21/2016   Procedure: ESOPHAGOGASTRODUODENOSCOPY (EGD);  Surgeon: Janel Medford, MD;  Location: [facility] ENDOSCOPY;  Service: Endoscopy;  Laterality: N/A;   NO PAST SURGERIES      Family Psychiatric History:   Family History:  Family History  Problem Relation Age of Onset   Cancer Mother        died of breast cancer   Alcoholism Mother    Breast cancer Mother    Cancer Father    Hypertension Sister    Breast cancer Sister    Cancer - Ovarian Sister    Hypertension Brother    Diabetes Brother    Colon cancer Neg Hx     Social History:   Social History   Socioeconomic History   Marital status: Single    Spouse name: Not on file   Number of children: Not on file   Years of education: Not on file   Highest education level: Not on file  Occupational History   Not on file  Tobacco Use   Smoking status: Every Day    Current packs/day: 0.50    Average packs/day: 0.5 packs/day for 42.0 years (21.0 ttl pk-yrs)    Types: Cigarettes   Smokeless tobacco: Never   Tobacco comments:    1/2ppd   Substance and Sexual Activity  Alcohol use: Yes    Alcohol/week: 2.0 standard drinks of alcohol    Types: 1 Cans of beer, 1 Standard drinks or equivalent per week   Drug use: No    Comment: hx of cocaine use   Sexual activity: Never  Other Topics Concern   Not on file  Social History Narrative   Lives alone   Niece power of attorney   Gets SSI   Single         Social Drivers of Health   Financial Resource Strain: Low Risk  (01/18/2024)   Overall Financial Resource Strain (CARDIA)    Difficulty of Paying Living Expenses: Not hard at all  Food Insecurity: Food Insecurity Present (01/18/2024)   Hunger Vital Sign    Worried About Running Out of Food in the Last Year: Sometimes true    Ran Out of Food in the Last Year: Sometimes true  Transportation Needs: No Transportation Needs (01/18/2024)   PRAPARE - Administrator, Civil Service  (Medical): No    Lack of Transportation (Non-Medical): No  Physical Activity: Sufficiently Active (01/18/2024)   Exercise Vital Sign    Days of Exercise per Week: 7 days    Minutes of Exercise per Session: 60 min  Stress: No Stress Concern Present (01/18/2024)   [employer] of Occupational Health - Occupational Stress Questionnaire    Feeling of Stress : Not at all  Social Connections: Socially Isolated (01/18/2024)   Social Connection and Isolation Panel [NHANES]    Frequency of Communication with Friends and Family: Twice a week    Frequency of Social Gatherings with Friends and Family: Once a week    Attends Religious Services: Never    Database administrator or Organizations: No    Attends Banker Meetings: Never    Marital Status: Never married    Additional Social History:   Allergies:   Allergies  Allergen Reactions   Other Rash    Tomatoes and Chocolate     Metabolic Disorder Labs: Lab Results  Component Value Date   HGBA1C 5.1 11/17/2021   No results found for: "PROLACTIN" Lab Results  Component Value Date   CHOL 188 01/10/2024   TRIG 65 01/10/2024   HDL 70 01/10/2024   CHOLHDL 2.7 01/10/2024   VLDL 14 11/24/2009   LDLCALC 106 (H) 01/10/2024   LDLCALC 103 (H) 11/17/2021   Lab Results  Component Value Date   TSH 2.210 12/24/2019    Therapeutic Level Labs: No results found for: "LITHIUM" Lab Results  Component Value Date   CBMZ 7.1 05/19/2020   Lab Results  Component Value Date   VALPROATE <4 (L) 12/24/2019    Current Medications: Current Outpatient Medications  Medication Sig Dispense Refill   amLODipine  (NORVASC ) 10 MG tablet Take 1 tablet (10 mg total) by mouth daily. 90 tablet 3   aspirin-acetaminophen -caffeine (EXCEDRIN  MIGRAINE) 250-250-65 MG tablet Take 2 tablets by mouth every 6 (six) hours as needed for headache. 30 tablet 0   carbamazepine  (TEGRETOL ) 200 MG tablet 2 bid 120 tablet 6   fluticasone  (FLONASE ) 50 MCG/ACT  nasal spray Place 1 spray into both nostrils daily. 48 g 3   olmesartan  (BENICAR ) 40 MG tablet Take 1 tablet (40 mg total) by mouth daily. 90 tablet 3   rosuvastatin  (CRESTOR ) 10 MG tablet Take 1 tablet (10 mg total) by mouth daily. 90 tablet 3   triamcinolone  cream (KENALOG ) 0.1 % APPLY TO AFFECTED AREA TWICE A DAY 30 g  0   No current facility-administered medications for this visit.    Musculoskeletal: Strength & Muscle Tone: within normal limits Gait & Station: normal Patient leans: N/A  Psychiatric Specialty Exam: Review of Systems  There were no vitals taken for this visit.There is no height or weight on file to calculate BMI.  General Appearance: Casual  Eye Contact:  Good  Speech:  NA  Volume:  Normal  Mood:  Negative  Affect:  Appropriate  Thought Process:  Goal Directed  Orientation:  Full (Time, Place, and Person)  Thought Content:  Logical  Suicidal Thoughts:  No  Homicidal Thoughts:  No  Memory:  NA  Judgement:  Fair  Insight:  Fair  Psychomotor Activity:  Psychomotor Retardation  Concentration:    Recall:  Good  Fund of Knowledge:Good  Language: Fair  Akathisia:  No  Handed:  Right  AIMS (if indicated):  not done  Assets:  Desire for Improvement  ADL's:  Intact  Cognition: WNL  Sleep:  Good   Screenings: PHQ2-9    Flowsheet Row Clinical Support from 01/18/2024 in [facility] - A Dept Of [place]. [facility] Office Visit from 01/10/2024 in [facility] Med [facility] - A Dept Of [place]. [facility] Office Visit from 12/06/2023 in [facility] - A Dept Of [place]. [facility] Office Visit from 07/24/2023 in [facility] Med [facility] - A Dept Of [place]. [facility] Office Visit from 05/16/2023 in [facility] Internal Med [facility]tr - A Dept Of [place]. [facility]  PHQ-2 Total Score 0 0 0 0 0       Assessment and Plan:    This patient's diagnosis is bipolar disorder.  And  most likely more appropriately is due to brain injury and mood condition related to it.  She has been taking Tegretol  for years and done very well.  Her mood is even.  Today she will continue her Tegretol .  She will get some blood work in the next week or 2 measuring her Tegretol  comprehensive metabolic panel and CBC.  She is very stable.  She will return to see me in 4 months. Collaboration of Care:   Patient/Guardian was advised Release of Information must be obtained prior to any record release in order to collaborate their care with an outside provider. Patient/Guardian was advised if they have not already done so to contact the registration department to sign all necessary forms in order for us  to release information regarding their care.   Consent: Patient/Guardian gives verbal consent for treatment and assignment of benefits for services provided during this visit. Patient/Guardian expressed understanding and agreed to proceed.   Delorse Fey, MD 5/21/20254:27 PM

## 2024-04-02 ENCOUNTER — Ambulatory Visit (HOSPITAL_COMMUNITY)

## 2024-04-04 ENCOUNTER — Ambulatory Visit (HOSPITAL_COMMUNITY)

## 2024-04-04 ENCOUNTER — Ambulatory Visit (HOSPITAL_BASED_OUTPATIENT_CLINIC_OR_DEPARTMENT_OTHER)

## 2024-04-04 DIAGNOSIS — F3110 Bipolar disorder, current episode manic without psychotic features, unspecified: Secondary | ICD-10-CM

## 2024-04-04 DIAGNOSIS — Z79899 Other long term (current) drug therapy: Secondary | ICD-10-CM | POA: Diagnosis not present

## 2024-04-04 NOTE — Progress Notes (Signed)
 Patient arrives today for her due labs, she presents well groomed with an appropriate affect. Patient was nervous but had no questions. Patients right arm was prepped for venipuncture and I drew a tiger top, lavender and green with a 22G needle. Patient tolerated well and without complaint.

## 2024-04-05 LAB — COMPREHENSIVE METABOLIC PANEL WITH GFR
ALT: 12 IU/L (ref 0–32)
AST: 19 IU/L (ref 0–40)
Albumin: 4.4 g/dL (ref 3.9–4.9)
Alkaline Phosphatase: 100 IU/L (ref 44–121)
BUN/Creatinine Ratio: 15 (ref 12–28)
BUN: 13 mg/dL (ref 8–27)
Bilirubin Total: <0.2 mg/dL (ref 0.0–1.2)
CO2: 23 mmol/L (ref 20–29)
Calcium: 9.4 mg/dL (ref 8.7–10.3)
Chloride: 105 mmol/L (ref 96–106)
Creatinine, Ser: 0.87 mg/dL (ref 0.57–1.00)
Globulin, Total: 2.6 g/dL (ref 1.5–4.5)
Glucose: 70 mg/dL (ref 70–99)
Potassium: 4.1 mmol/L (ref 3.5–5.2)
Sodium: 142 mmol/L (ref 134–144)
Total Protein: 7 g/dL (ref 6.0–8.5)
eGFR: 74 mL/min/{1.73_m2} (ref >59)

## 2024-04-05 LAB — CBC WITH DIFFERENTIAL/PLATELET
Basophils Absolute: 0 10*3/uL (ref 0.0–0.2)
Basos: 1 %
EOS (ABSOLUTE): 0.1 10*3/uL (ref 0.0–0.4)
Eos: 2 %
Hematocrit: 43.4 % (ref 34.0–46.6)
Hemoglobin: 14.3 g/dL (ref 11.1–15.9)
Immature Grans (Abs): 0 10*3/uL (ref 0.0–0.1)
Immature Granulocytes: 0 %
Lymphocytes Absolute: 2.4 10*3/uL (ref 0.7–3.1)
Lymphs: 33 %
MCH: 30.8 pg (ref 26.6–33.0)
MCHC: 32.9 g/dL (ref 31.5–35.7)
MCV: 93 fL (ref 79–97)
Monocytes Absolute: 0.5 10*3/uL (ref 0.1–0.9)
Monocytes: 7 %
Neutrophils Absolute: 4.3 10*3/uL (ref 1.4–7.0)
Neutrophils: 57 %
Platelets: 211 10*3/uL (ref 150–450)
RBC: 4.65 x10E6/uL (ref 3.77–5.28)
RDW: 13.2 % (ref 11.7–15.4)
WBC: 7.4 10*3/uL (ref 3.4–10.8)

## 2024-04-05 LAB — CARBAMAZEPINE LEVEL, TOTAL: Carbamazepine (Tegretol), S: 6.7 ug/mL (ref 4.0–12.0)

## 2024-04-11 ENCOUNTER — Ambulatory Visit (INDEPENDENT_AMBULATORY_CARE_PROVIDER_SITE_OTHER): Admitting: Internal Medicine

## 2024-04-11 ENCOUNTER — Encounter: Payer: Self-pay | Admitting: Internal Medicine

## 2024-04-11 VITALS — BP 148/96 | HR 69 | Temp 98.2°F

## 2024-04-11 DIAGNOSIS — F17210 Nicotine dependence, cigarettes, uncomplicated: Secondary | ICD-10-CM | POA: Diagnosis not present

## 2024-04-11 DIAGNOSIS — F17200 Nicotine dependence, unspecified, uncomplicated: Secondary | ICD-10-CM

## 2024-04-11 DIAGNOSIS — Z129 Encounter for screening for malignant neoplasm, site unspecified: Secondary | ICD-10-CM

## 2024-04-11 DIAGNOSIS — I10 Essential (primary) hypertension: Secondary | ICD-10-CM | POA: Diagnosis not present

## 2024-04-11 MED ORDER — VARENICLINE TARTRATE 0.5 MG PO TABS
0.5000 mg | ORAL_TABLET | Freq: Two times a day (BID) | ORAL | 1 refills | Status: DC
Start: 2024-04-11 — End: 2024-05-16

## 2024-04-11 MED ORDER — OLMESARTAN-AMLODIPINE-HCTZ 40-10-12.5 MG PO TABS
1.0000 | ORAL_TABLET | Freq: Every day | ORAL | 1 refills | Status: AC
Start: 2024-04-11 — End: ?

## 2024-04-11 NOTE — Assessment & Plan Note (Addendum)
 Patient has never had a colonoscopy performed. She is established with [facility] and has had EGD performed.  - Referral to [facility] placed

## 2024-04-11 NOTE — Assessment & Plan Note (Signed)
 BP today is 142/91, similar on recheck. She has been taking her daily amlodipine  10mg  and olemsartan 40mg  and did take them this morning. Her BP log shows similarly elevated blood pressures.  - Discontinue amlodipine  and olmesartan , start olmesartan -amlodipine -hydrochlorothiazide  40-10-12.5mg  daily - Return in 1 month for BP recheck and adjustment as needed

## 2024-04-11 NOTE — Patient Instructions (Addendum)
 Ms Neville,   It was a pleasure meeting you today.   For your blood pressure, I am starting a new medication that combines your current olmesartan  and amlodipine  with a new blood pressure medicine called hydrochlorothiazide . This medication will make you urinate more. We will plan to see you again in about 1 month recheck your blood pressure and adjust as needed.   I am also prescribing a medication called Chantix. This will help reduce tobacco cravings. The main side effect to be aware of is very vivid dreams.   Thanks,  Dr Esaw Heckler

## 2024-04-11 NOTE — Assessment & Plan Note (Signed)
 Patient reports smoking 1/2 ppd. She is interested in quitting and would like to start varenicline.  - Chantix 0.5mg  daily started. Increase dose at next appointment in 4 weeks if needed

## 2024-04-11 NOTE — Progress Notes (Signed)
    Subjective:  CC: BP f/u  HPI:  [name] is a 68 y.o. female with a past medical history stated below and presents today for above. Please see problem based assessment and plan for additional details.  Past Medical History:  Diagnosis Date   Allergy    Anxiety    Bipolar disorder (HCC)    Fetal alcohol syndrome 1959   GERD (gastroesophageal reflux disease)    HEADACHE, TENSION 08/02/2007   Qualifier: Diagnosis of   By: Jayne Mews MD, Elizabeth         Hypertension    Nephrolithiasis 11/09/2011   CT abdomen 11/2011:  IMPRESSION:   1. Tiny (1-2 mm) non-obstructing calculi in the upper pole calyces   of the left kidney. No urinary tract calculi elsewhere on either   side.            Pancreatitis    PUD (peptic ulcer disease) 06/20/2016   Substance abuse (HCC)    alcohol and tobacco   Ulcer     Current Outpatient Medications on File Prior to Visit  Medication Sig Dispense Refill   aspirin-acetaminophen -caffeine (EXCEDRIN  MIGRAINE) 250-250-65 MG tablet Take 2 tablets by mouth every 6 (six) hours as needed for headache. 30 tablet 0   carbamazepine  (TEGRETOL ) 200 MG tablet 2 bid 120 tablet 6   fluticasone  (FLONASE ) 50 MCG/ACT nasal spray Place 1 spray into both nostrils daily. 48 g 3   rosuvastatin  (CRESTOR ) 10 MG tablet Take 1 tablet (10 mg total) by mouth daily. 90 tablet 3   triamcinolone  cream (KENALOG ) 0.1 % APPLY TO AFFECTED AREA TWICE A DAY 30 g 0   No current facility-administered medications on file prior to visit.   Review of Systems: ROS negative except for as is noted on the assessment and plan.  Objective:   Vitals:   04/11/24 1029 04/11/24 1041  BP: (!) 142/91 (!) 148/96  Pulse: 69 69  Temp: 98.2 F (36.8 C)   TempSrc: Oral   SpO2: 98%    Physical Exam: Constitutional: well-appearing, in no acute distress Cardiovascular: regular rate and rhythm Pulmonary/Chest: normal work of breathing on room air MSK: normal bulk and tone  Assessment & Plan:    Essential hypertension BP today is 142/91, similar on recheck. She has been taking her daily amlodipine  10mg  and olemsartan 40mg  and did take them this morning. Her BP log shows similarly elevated blood pressures.  - Discontinue amlodipine  and olmesartan , start olmesartan -amlodipine -hydrochlorothiazide  40-10-12.5mg  daily - Return in 1 month for BP recheck and adjustment as needed  Tobacco use disorder Patient reports smoking 1/2 ppd. She is interested in quitting and would like to start varenicline.  - Chantix 0.5mg  daily started. Increase dose at next appointment in 4 weeks if needed  Cancer screening Patient has never had a colonoscopy performed. She is established with [facility] and has had EGD performed.  - Referral to [facility] placed    Patient discussed with Dr. Gaylon Kea MD [facility]  PGY-1 Pager: [phone] Date 04/11/2024  Time 11:23 AM

## 2024-04-12 NOTE — Addendum Note (Signed)
 Addended by: Bevelyn Bryant on: 04/12/2024 10:00 AM   Modules accepted: Level of Service

## 2024-04-12 NOTE — Progress Notes (Signed)
 Internal Medicine Clinic Attending  Case discussed with the resident at the time of the visit.  We reviewed the resident's history and exam and pertinent patient test results.  I agree with the assessment, diagnosis, and plan of care documented in the resident's note.  I have asked Dr. Esaw Heckler to call Ms. Seelinger to discuss Chantix titration in order to get to maintenance dose.

## 2024-04-26 ENCOUNTER — Encounter: Payer: Self-pay | Admitting: *Deleted

## 2024-05-16 ENCOUNTER — Ambulatory Visit (INDEPENDENT_AMBULATORY_CARE_PROVIDER_SITE_OTHER): Admitting: Student

## 2024-05-16 VITALS — BP 119/82 | HR 96 | Temp 97.5°F | Ht 64.0 in | Wt 106.6 lb

## 2024-05-16 DIAGNOSIS — F17200 Nicotine dependence, unspecified, uncomplicated: Secondary | ICD-10-CM

## 2024-05-16 DIAGNOSIS — I10 Essential (primary) hypertension: Secondary | ICD-10-CM

## 2024-05-16 DIAGNOSIS — Z129 Encounter for screening for malignant neoplasm, site unspecified: Secondary | ICD-10-CM

## 2024-05-16 DIAGNOSIS — F17210 Nicotine dependence, cigarettes, uncomplicated: Secondary | ICD-10-CM | POA: Diagnosis not present

## 2024-05-16 MED ORDER — VARENICLINE TARTRATE 1 MG PO TABS: 1.0000 mg | ORAL_TABLET | Freq: Two times a day (BID) | ORAL | 1 refills | Status: DC

## 2024-05-16 NOTE — Assessment & Plan Note (Addendum)
 The patient missed her scheduled colonoscopy appointment. I will send in a new referral today. Given her extensive medical history, including a >20 pack-year smoking history and current tobacco use, I believe she would benefit from low-dose CT chest screening for lung cancer. I will place the order today

## 2024-05-16 NOTE — Assessment & Plan Note (Signed)
 Roberta Miller has a documented history of tobacco use disorder. She reports smoking approximately one pack per day since age 68. She was recently started on Chantix  0.5 mg twice daily for smoking cessation. Today, she states that she has successfully reduced her cigarette consumption to about 4-5 per day, which she attributes to the Chantix , as previous quit attempts were unsuccessful. I will increase her Chantix  dose to 1 mg twice daily for 12 weeks and reassess her progress during the next visit. Plan: Encourage continued adherence to Chantix  Increase Chantix  to 1 mg twice daily for 12 weeks Provide motivational counseling

## 2024-05-16 NOTE — Patient Instructions (Signed)
 Thank you, [name], for trusting us  with your care today. We discussed your blood sugar management as well as your efforts toward smoking cessation.  I'm pleased to see your blood pressure has improved significantly. Since you are starting a new medication, I would like to order some labs to monitor your electrolytes. I will contact you to review the results once they are available.  Additionally, I am increasing your Chantix  dosage to 1 mg in the morning and 1 mg at night. If you still have the 0.5 mg tablets at home, please take two tablets in the morning and two at night until your new prescription is filled. Please be sure to follow up with gastroenterology to schedule the colonoscopy as we discussed during your visit. I commend you for your efforts to quit smo  I have ordered the following labs for you:  Lab Orders         BMP8+Anion Gap       Tests ordered today:    Referrals ordered today:   Referral Orders         Ambulatory referral to Gastroenterology       I have ordered the following medication/changed the following medications:   Stop the following medications: Medications Discontinued During This Encounter  Medication Reason   varenicline  (CHANTIX ) 0.5 MG tablet Reorder     Start the following medications: Meds ordered this encounter  Medications   varenicline  (CHANTIX ) 1 MG tablet    Sig: Take 1 tablet (1 mg total) by mouth 2 (two) times daily.    Dispense:  120 tablet    Refill:  1     Follow up: 2-3 months   Remember:   Should you have any questions or concerns please call the internal medicine clinic at [phone].   Drue Lisa Grow MD 05/16/2024, 11:27 AM   [facility]

## 2024-05-16 NOTE — Assessment & Plan Note (Signed)
 BP Readings from Last 3 Encounters:  05/16/24 119/82  04/11/24 (!) 148/96  03/08/24 (!) 183/105  The patient presented to the office for management of her chronic conditions, including hypertension. She was recently switched to olmesartan -amlodipine -HCTZ 40-10-12.5 mg once daily. Her ambulatory blood pressure monitoring showed a mean average of 121/84 mmHg, which is reassuring and suggests her current regimen is effective.We discussed the importance of continued medication adherence and lifestyle modifications. Since she started the thiazide component four weeks ago, it is appropriate to check a basic metabolic panel (BMP) today to monitor electrolyte levels. Plan: Continue olmesartan -amlodipine -HCTZ 40-10-12.5 mg daily Obtain BMP today

## 2024-05-16 NOTE — Progress Notes (Signed)
 CC: Follow-up  HPI:  [name] is a 68 y.o. female living with a history stated below and presents today for routine follow-up. Please see problem based assessment and plan for additional details.  Past Medical History:  Diagnosis Date   Allergy    Anxiety    Bipolar disorder (HCC)    Fetal alcohol syndrome 1959   GERD (gastroesophageal reflux disease)    HEADACHE, TENSION 08/02/2007   Qualifier: Diagnosis of   By: Adella MD, Elizabeth         Hypertension    Nephrolithiasis 11/09/2011   CT abdomen 11/2011:  IMPRESSION:   1. Tiny (1-2 mm) non-obstructing calculi in the upper pole calyces   of the left kidney. No urinary tract calculi elsewhere on either   side.            Pancreatitis    PUD (peptic ulcer disease) 06/20/2016   Substance abuse (HCC)    alcohol and tobacco   Ulcer     Current Outpatient Medications on File Prior to Visit  Medication Sig Dispense Refill   aspirin-acetaminophen -caffeine (EXCEDRIN  MIGRAINE) 250-250-65 MG tablet Take 2 tablets by mouth every 6 (six) hours as needed for headache. 30 tablet 0   carbamazepine  (TEGRETOL ) 200 MG tablet 2 bid 120 tablet 6   fluticasone  (FLONASE ) 50 MCG/ACT nasal spray Place 1 spray into both nostrils daily. 48 g 3   Olmesartan -amLODIPine -HCTZ 40-10-12.5 MG TABS Take 1 tablet by mouth daily. 30 tablet 1   rosuvastatin  (CRESTOR ) 10 MG tablet Take 1 tablet (10 mg total) by mouth daily. 90 tablet 3   triamcinolone  cream (KENALOG ) 0.1 % APPLY TO AFFECTED AREA TWICE A DAY 30 g 0   No current facility-administered medications on file prior to visit.    Family History  Problem Relation Age of Onset   Cancer Mother        died of breast cancer   Alcoholism Mother    Breast cancer Mother    Cancer Father    Hypertension Sister    Breast cancer Sister    Cancer - Ovarian Sister    Hypertension Brother    Diabetes Brother    Colon cancer Neg Hx     Social History   Socioeconomic History   Marital status:  Single    Spouse name: Not on file   Number of children: Not on file   Years of education: Not on file   Highest education level: Not on file  Occupational History   Not on file  Tobacco Use   Smoking status: Every Day    Current packs/day: 0.50    Average packs/day: 0.5 packs/day for 42.0 years (21.0 ttl pk-yrs)    Types: Cigarettes   Smokeless tobacco: Never   Tobacco comments:    1/2ppd   Substance and Sexual Activity   Alcohol use: Yes    Alcohol/week: 2.0 standard drinks of alcohol    Types: 1 Cans of beer, 1 Standard drinks or equivalent per week   Drug use: No    Comment: hx of cocaine use   Sexual activity: Never  Other Topics Concern   Not on file  Social History Narrative   Lives alone   Niece power of attorney   Gets SSI   Single         Social Drivers of Health   Financial Resource Strain: Low Risk  (01/18/2024)   Overall Financial Resource Strain (CARDIA)    Difficulty of Paying Living Expenses: Not  hard at all  Food Insecurity: Food Insecurity Present (01/18/2024)   Hunger Vital Sign    Worried About Running Out of Food in the Last Year: Sometimes true    Ran Out of Food in the Last Year: Sometimes true  Transportation Needs: No Transportation Needs (01/18/2024)   PRAPARE - Administrator, Civil Service (Medical): No    Lack of Transportation (Non-Medical): No  Physical Activity: Sufficiently Active (01/18/2024)   Exercise Vital Sign    Days of Exercise per Week: 7 days    Minutes of Exercise per Session: 60 min  Stress: No Stress Concern Present (01/18/2024)   [employer] of Occupational Health - Occupational Stress Questionnaire    Feeling of Stress : Not at all  Social Connections: Socially Isolated (01/18/2024)   Social Connection and Isolation Panel    Frequency of Communication with Friends and Family: Twice a week    Frequency of Social Gatherings with Friends and Family: Once a week    Attends Religious Services: Never     Database administrator or Organizations: No    Attends Banker Meetings: Never    Marital Status: Never married  Intimate Partner Violence: Not At Risk (01/18/2024)   Humiliation, Afraid, Rape, and Kick questionnaire    Fear of Current or Ex-Partner: No    Emotionally Abused: No    Physically Abused: No    Sexually Abused: No    Review of Systems: ROS negative except for what is noted on the assessment and plan.  Vitals:   05/16/24 1101  BP: 119/82  Pulse: 96  Temp: (!) 97.5 F (36.4 C)  TempSrc: Oral  SpO2: 96%  Weight: 106 lb 9.6 oz (48.4 kg)  Height: 5' 4 (1.626 m)    Physical Exam: Constitutional: well-appearing woman, sitting in chair, with caretaker in the room Cardiovascular: regular rate and rhythm, no m/r/g Pulmonary/Chest: normal work of breathing on room air Skin: warm and dry Psych: normal mood and behavior  Assessment & Plan:   Cancer screening The patient missed her scheduled colonoscopy appointment. I will send in a new referral today. Given her extensive medical history, including a >20 pack-year smoking history and current tobacco use, I believe she would benefit from low-dose CT chest screening for lung cancer. I will place the order today  Essential hypertension BP Readings from Last 3 Encounters:  05/16/24 119/82  04/11/24 (!) 148/96  03/08/24 (!) 183/105  The patient presented to the office for management of her chronic conditions, including hypertension. She was recently switched to olmesartan -amlodipine -HCTZ 40-10-12.5 mg once daily. Her ambulatory blood pressure monitoring showed a mean average of 121/84 mmHg, which is reassuring and suggests her current regimen is effective.We discussed the importance of continued medication adherence and lifestyle modifications. Since she started the thiazide component four weeks ago, it is appropriate to check a basic metabolic panel (BMP) today to monitor electrolyte levels. Plan: Continue  olmesartan -amlodipine -HCTZ 40-10-12.5 mg daily Obtain BMP today  Tobacco use disorder [name] has a documented history of tobacco use disorder. She reports smoking approximately one pack per day since age 65. She was recently started on Chantix  0.5 mg twice daily for smoking cessation. Today, she states that she has successfully reduced her cigarette consumption to about 4-5 per day, which she attributes to the Chantix , as previous quit attempts were unsuccessful. I will increase her Chantix  dose to 1 mg twice daily for 12 weeks and reassess her progress during the next visit.  Plan: Encourage continued adherence to Chantix  Increase Chantix  to 1 mg twice daily for 12 weeks Provide motivational counseling     Patient discussed with Dr. CHARLENA Rosan Drue Renne, M.D [facility] Phone: [phone] Date 05/16/2024 Time 11:38 AM

## 2024-05-17 LAB — BMP8+ANION GAP
Anion Gap: 15 mmol/L (ref 10.0–18.0)
BUN/Creatinine Ratio: 21 (ref 12–28)
BUN: 19 mg/dL (ref 8–27)
CO2: 22 mmol/L (ref 20–29)
Calcium: 9.4 mg/dL (ref 8.7–10.3)
Chloride: 107 mmol/L — ABNORMAL HIGH (ref 96–106)
Creatinine, Ser: 0.89 mg/dL (ref 0.57–1.00)
Glucose: 76 mg/dL (ref 70–99)
Potassium: 4.5 mmol/L (ref 3.5–5.2)
Sodium: 144 mmol/L (ref 134–144)
eGFR: 72 mL/min/1.73 (ref >59)

## 2024-05-24 ENCOUNTER — Encounter: Payer: Self-pay | Admitting: Internal Medicine

## 2024-05-30 NOTE — Progress Notes (Signed)
 Internal Medicine Clinic Attending  Case discussed with the resident at the time of the visit.  We reviewed the resident's history and exam and pertinent patient test results.  I agree with the assessment, diagnosis, and plan of care documented in the resident's note.

## 2024-06-14 ENCOUNTER — Ambulatory Visit (HOSPITAL_COMMUNITY)
Admission: RE | Admit: 2024-06-14 | Discharge: 2024-06-14 | Disposition: A | Discharge status: Home / Self Care | Source: Ambulatory Visit | Attending: Family Medicine | Admitting: Family Medicine

## 2024-06-14 DIAGNOSIS — F17210 Nicotine dependence, cigarettes, uncomplicated: Secondary | ICD-10-CM | POA: Insufficient documentation

## 2024-06-14 DIAGNOSIS — Z122 Encounter for screening for malignant neoplasm of respiratory organs: Secondary | ICD-10-CM | POA: Insufficient documentation

## 2024-06-14 DIAGNOSIS — J439 Emphysema, unspecified: Secondary | ICD-10-CM | POA: Diagnosis not present

## 2024-06-14 DIAGNOSIS — I2510 Atherosclerotic heart disease of native coronary artery without angina pectoris: Secondary | ICD-10-CM | POA: Insufficient documentation

## 2024-06-14 DIAGNOSIS — I700 Atherosclerosis of aorta: Secondary | ICD-10-CM | POA: Insufficient documentation

## 2024-06-14 DIAGNOSIS — N200 Calculus of kidney: Secondary | ICD-10-CM | POA: Diagnosis not present

## 2024-06-14 DIAGNOSIS — F17200 Nicotine dependence, unspecified, uncomplicated: Secondary | ICD-10-CM | POA: Diagnosis present

## 2024-07-16 ENCOUNTER — Ambulatory Visit (HOSPITAL_COMMUNITY): Admitting: Psychiatry

## 2024-07-17 ENCOUNTER — Encounter

## 2024-07-18 ENCOUNTER — Ambulatory Visit (AMBULATORY_SURGERY_CENTER)

## 2024-07-18 VITALS — Ht 64.0 in | Wt 105.0 lb

## 2024-07-18 DIAGNOSIS — Z1211 Encounter for screening for malignant neoplasm of colon: Secondary | ICD-10-CM

## 2024-07-18 MED ORDER — NA SULFATE-K SULFATE-MG SULF 17.5-3.13-1.6 GM/177ML PO SOLN: 1.0000 | Freq: Once | ORAL | 0 refills | Status: AC

## 2024-07-18 NOTE — Progress Notes (Signed)
 Pre visit completed in person; Patient verified name, DOB, and address; No egg or soy allergy known to patient  No issues known to pt with past sedation with any surgeries or procedures; Patient denies ever being told they had issues or difficulty with intubation;  No FH of Malignant Hyperthermia; Pt is not on diet pills; Pt is not on home 02;  Pt is not on blood thinners;  Pt denies issues with constipation;  No A fib or A flutter; Have any cardiac testing pending--NO Insurance verified during PV appt--- Medicare/Medicaid Pt can ambulate without assistance;  Pt denies use of chewing tobacco; Discussed diabetic/weight loss medication holds; Discussed NSAID holds; Checked BMI to be less than 50; Pt instructed to use Singlecare.com or GoodRx for a price reduction on prep  Patient's chart reviewed by Norleen Schillings CNRA prior to previsit and patient appropriate for the [facility].  Pre visit completed and red dot placed by patient's name on their procedure day (on provider's schedule).    Instructions sent to MyChart as well as printed for the patient and her niece per her request;

## 2024-07-30 ENCOUNTER — Ambulatory Visit (HOSPITAL_COMMUNITY): Admitting: Psychiatry

## 2024-07-31 ENCOUNTER — Ambulatory Visit: Admitting: Internal Medicine

## 2024-07-31 ENCOUNTER — Encounter: Payer: Self-pay | Admitting: Internal Medicine

## 2024-07-31 VITALS — BP 137/95 | HR 55 | Temp 98.1°F | Resp 12 | Ht 64.0 in | Wt 105.0 lb

## 2024-07-31 DIAGNOSIS — K635 Polyp of colon: Secondary | ICD-10-CM

## 2024-07-31 DIAGNOSIS — I10 Essential (primary) hypertension: Secondary | ICD-10-CM | POA: Diagnosis not present

## 2024-07-31 DIAGNOSIS — D122 Benign neoplasm of ascending colon: Secondary | ICD-10-CM

## 2024-07-31 DIAGNOSIS — Z1211 Encounter for screening for malignant neoplasm of colon: Secondary | ICD-10-CM

## 2024-07-31 MED ORDER — SODIUM CHLORIDE 0.9 % IV SOLN: 500.0000 mL | Freq: Once | INTRAVENOUS | Status: DC

## 2024-07-31 NOTE — Progress Notes (Signed)
 Called to room to assist during endoscopic procedure.  Patient ID and intended procedure confirmed with present staff. Received instructions for my participation in the procedure from the performing physician.

## 2024-07-31 NOTE — Progress Notes (Signed)
 Vss nad trans to pacu

## 2024-07-31 NOTE — Patient Instructions (Signed)
Handout provided on polyps.  Resume previous diet.  Continue present medications.  Await pathology results.  Repeat colonoscopy in 7-10 years for surveillance.   YOU HAD AN ENDOSCOPIC PROCEDURE TODAY AT THE [facility]:   Refer to the procedure report that was given to you for any specific questions about what was found during the examination.  If the procedure report does not answer your questions, please call your gastroenterologist to clarify.  If you requested that your care partner not be given the details of your procedure findings, then the procedure report has been included in a sealed envelope for you to review at your convenience later.  YOU SHOULD EXPECT: Some feelings of bloating in the abdomen. Passage of more gas than usual.  Walking can help get rid of the air that was put into your GI tract during the procedure and reduce the bloating. If you had a lower endoscopy (such as a colonoscopy or flexible sigmoidoscopy) you may notice spotting of blood in your stool or on the toilet paper. If you underwent a bowel prep for your procedure, you may not have a normal bowel movement for a few days.  Please Note:  You might notice some irritation and congestion in your nose or some drainage.  This is from the oxygen used during your procedure.  There is no need for concern and it should clear up in a day or so.  SYMPTOMS TO REPORT IMMEDIATELY:  Following lower endoscopy (colonoscopy or flexible sigmoidoscopy):  Excessive amounts of blood in the stool  Significant tenderness or worsening of abdominal pains  Swelling of the abdomen that is new, acute  Fever of 100F or higher  For urgent or emergent issues, a gastroenterologist can be reached at any hour by calling [phone]. Do not use MyChart messaging for urgent concerns.    DIET:  We do recommend a small meal at first, but then you may proceed to your regular diet.  Drink plenty of fluids but you should avoid alcoholic  beverages for 24 hours.  ACTIVITY:  You should plan to take it easy for the rest of today and you should NOT DRIVE or use heavy machinery until tomorrow (because of the sedation medicines used during the test).    FOLLOW UP: Our staff will call the number listed on your records the next business day following your procedure.  We will call around 7:15- 8:00 am to check on you and address any questions or concerns that you may have regarding the information given to you following your procedure. If we do not reach you, we will leave a message.     If any biopsies were taken you will be contacted by phone or by letter within the next 1-3 weeks.  Please call us at [phone] if you have not heard about the biopsies in 3 weeks.    SIGNATURES/CONFIDENTIALITY: You and/or your care partner have signed paperwork which will be entered into your electronic medical record.  These signatures attest to the fact that that the information above on your After Visit Summary has been reviewed and is understood.  Full responsibility of the confidentiality of this discharge information lies with you and/or your care-partner.

## 2024-07-31 NOTE — Progress Notes (Signed)
 Pt's states no medical or surgical changes since previsit or office visit.

## 2024-07-31 NOTE — Op Note (Signed)
 [facility] Patient Name: Roberta Miller Procedure Date: 07/31/2024 11:01 AM MRN: [id number] Endoscopist: Norleen SAILOR. Abran , MD, [id number] Age: 68 Referring MD:  Date of Birth: 1956-09-19 Gender: Female Account #: [id number] Procedure:                Colonoscopy with cold snare polypectomy x 1 Indications:              Screening for colorectal malignant neoplasm Medicines:                Monitored Anesthesia Care Procedure:                Pre-Anesthesia Assessment:                           - Prior to the procedure, a History and Physical                            was performed, and patient medications and                            allergies were reviewed. The patient's tolerance of                            previous anesthesia was also reviewed. The risks                            and benefits of the procedure and the sedation                            options and risks were discussed with the patient.                            All questions were answered, and informed consent                            was obtained. Prior Anticoagulants: The patient has                            taken no anticoagulant or antiplatelet agents. ASA                            Grade Assessment: II - A patient with mild systemic                            disease. After reviewing the risks and benefits,                            the patient was deemed in satisfactory condition to                            undergo the procedure.                           After obtaining informed consent, the colonoscope  was passed under direct vision. Throughout the                            procedure, the patient's blood pressure, pulse, and                            oxygen saturations were monitored continuously. The                            Olympus Scope SN: [id number] was introduced through                            the anus and advanced to the the cecum, identified                             by appendiceal orifice and ileocecal valve. The                            ileocecal valve, appendiceal orifice, and rectum                            were photographed. The quality of the bowel                            preparation was excellent. The colonoscopy was                            performed without difficulty. The patient tolerated                            the procedure well. The bowel preparation used was                            SUPREP via split dose instruction. Scope In: 11:15:31 AM Scope Out: 11:29:35 AM Scope Withdrawal Time: 0 hours 11 minutes 0 seconds  Total Procedure Duration: 0 hours 14 minutes 4 seconds  Findings:                 A 5 mm polyp was found in the ascending colon. The                            polyp was removed with a cold snare. Resection and                            retrieval were complete.                           The entire examined colon appeared normal on direct                            and retroflexion views. Complications:            No immediate complications. Estimated blood loss:  None. Estimated Blood Loss:     Estimated blood loss: none. Impression:               - One 5 mm polyp in the ascending colon, removed                            with a cold snare. Resected and retrieved.                           - The entire examined colon is normal on direct and                            retroflexion views. Recommendation:           - Repeat colonoscopy in 7-10 years for surveillance.                           - Patient has a contact number available for                            emergencies. The signs and symptoms of potential                            delayed complications were discussed with the                            patient. Return to normal activities tomorrow.                            Written discharge instructions were provided to the                            patient.                            - Resume previous diet.                           - Continue present medications.                           - Await pathology results. Norleen SAILOR. Abran, MD 07/31/2024 11:32:56 AM This report has been signed electronically.

## 2024-07-31 NOTE — Progress Notes (Signed)
 HISTORY OF PRESENT ILLNESS:  Roberta Miller is a 68 y.o. female sent for screening colonoscopy.  No complaints  REVIEW OF SYSTEMS:  All non-GI ROS negative except for  Past Medical History:  Diagnosis Date   Allergy    Anxiety    Bipolar disorder (HCC)    Fetal alcohol syndrome 1959   GERD (gastroesophageal reflux disease)    HEADACHE, TENSION 08/02/2007   Qualifier: Diagnosis of   By: Adella MD, Roberta         Hypertension    Nephrolithiasis 11/09/2011   CT abdomen 11/2011:  IMPRESSION:   1. Tiny (1-2 mm) non-obstructing calculi in the upper pole calyces   of the left kidney. No urinary tract calculi elsewhere on either   side.            Pancreatitis    PUD (peptic ulcer disease) 06/20/2016   Substance abuse (HCC)    alcohol and tobacco   Ulcer     Past Surgical History:  Procedure Laterality Date   ESOPHAGOGASTRODUODENOSCOPY N/A 06/21/2016   Procedure: ESOPHAGOGASTRODUODENOSCOPY (EGD);  Surgeon: Toribio SHAUNNA Cedar, MD;  Location: [facility] END[facility]COPY;  Service: Endoscopy;  Laterality: N/A;    Social History Roberta Miller  reports that she has been smoking cigarettes. She has a 21 pack-year smoking history. She has never used smokeless tobacco. She reports current alcohol use of about 1.0 standard drink of alcohol per week. She reports that she does not use drugs.  family history includes Alcoholism in her mother; Breast cancer in her sister; Breast cancer (age of onset: 81) in her mother; Cancer - Ovarian in her father; Diabetes in her brother; Heart failure in her brother; Hypertension in her brother and sister; Kidney disease in her brother; Ovarian cancer (age of onset: 41) in her sister; Stomach cancer (age of onset: 79) in her brother.  Allergies  Allergen Reactions   Other Rash    Tomatoes and Chocolate        PHYSICAL EXAMINATION: Vital signs: BP 131/86   Pulse (!) 58   Temp 98.1 F (36.7 C)   Ht 5' 4 (1.626 m)   Wt 105 lb (47.6 kg)   SpO2 98%   BMI 18.02 kg/m   General: Well-developed, well-nourished, no acute distress HEENT: Sclerae are anicteric, conjunctiva pink. Oral mucosa intact Lungs: Clear Heart: Regular Abdomen: soft, nontender, nondistended, no obvious ascites, no peritoneal signs, normal bowel sounds. No organomegaly. Extremities: No edema Psychiatric: alert and oriented x3. Cooperative     ASSESSMENT:  Colon cancer screening   PLAN:   Screening colonoscopy

## 2024-08-01 ENCOUNTER — Telehealth: Payer: Self-pay

## 2024-08-01 NOTE — Telephone Encounter (Signed)
  Follow up Call-     07/31/2024   10:34 AM  Call back number  Post procedure Call Back phone  # [phone]  Permission to leave phone message Yes     Patient questions:  Do you have a fever, pain , or abdominal swelling? No. Pain Score  0 *  Have you tolerated food without any problems? Yes.    Have you been able to return to your normal activities? Yes.    Do you have any questions about your discharge instructions: Diet   No. Medications  No. Follow up visit  No.  Do you have questions or concerns about your Care? No.  Actions: * If pain score is 4 or above: No action needed, pain <4.

## 2024-08-05 LAB — SURGICAL PATHOLOGY

## 2024-08-06 ENCOUNTER — Ambulatory Visit: Payer: Self-pay | Admitting: Internal Medicine

## 2024-09-03 ENCOUNTER — Ambulatory Visit (HOSPITAL_BASED_OUTPATIENT_CLINIC_OR_DEPARTMENT_OTHER): Admitting: Psychiatry

## 2024-09-03 ENCOUNTER — Encounter (HOSPITAL_COMMUNITY): Payer: Self-pay | Admitting: Psychiatry

## 2024-09-03 ENCOUNTER — Encounter: Payer: Self-pay | Admitting: Dermatology

## 2024-09-03 ENCOUNTER — Ambulatory Visit (HOSPITAL_COMMUNITY): Admitting: Psychiatry

## 2024-09-03 ENCOUNTER — Ambulatory Visit: Admitting: Dermatology

## 2024-09-03 ENCOUNTER — Other Ambulatory Visit: Payer: Self-pay

## 2024-09-03 VITALS — BP 139/90 | HR 62 | Ht 64.0 in | Wt 102.6 lb

## 2024-09-03 VITALS — BP 120/63

## 2024-09-03 DIAGNOSIS — F3110 Bipolar disorder, current episode manic without psychotic features, unspecified: Secondary | ICD-10-CM | POA: Diagnosis not present

## 2024-09-03 DIAGNOSIS — L280 Lichen simplex chronicus: Secondary | ICD-10-CM | POA: Diagnosis not present

## 2024-09-03 DIAGNOSIS — L810 Postinflammatory hyperpigmentation: Secondary | ICD-10-CM | POA: Diagnosis not present

## 2024-09-03 MED ORDER — TRIAMCINOLONE ACETONIDE 0.1 % EX OINT: 1.0000 | TOPICAL_OINTMENT | Freq: Two times a day (BID) | CUTANEOUS | 3 refills | Status: AC | PRN

## 2024-09-03 MED ORDER — CARBAMAZEPINE 200 MG PO TABS: ORAL_TABLET | ORAL | 6 refills | Status: AC

## 2024-09-03 NOTE — Progress Notes (Signed)
   New Patient Visit   Subjective  Roberta Miller is a 68 y.o. female who presents for the following: Dark spots  Roberta Miller has two dark itchy spots on L arm. They have been present for years now. Primary care prescribed Triamcinolone  0.1% cream which did help but she ran out. When her skin gets dry then the spots are itchy. She has not had the Triamcinolone  cream for about a month.   The following portions of the chart were reviewed this encounter and updated as appropriate: medications, allergies, medical history  Review of Systems:  No other skin or systemic complaints except as noted in HPI or Assessment and Plan.  Objective  Well appearing patient in no apparent distress; mood and affect are within normal limits.  A focused examination was performed of the following areas: L arm  Relevant exam findings are noted in the Assessment and Plan.    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 Previous treatment with triamcinolone  cream was effective, but she has been without it for a month or two. Improvement is expected with consistent treatment and behavioral modification to reduce picking. Dark spots will fade with time, and skin hydration is crucial to prevent itching and picking.  - Prescribe triamcinolone  0.1% ointment to be applied twice daily for two weeks, followed by a two-week break. - Instruct to alternate between triamcinolone  ointment and Aquaphor every two weeks for six months. - Provide a sample of Aquaphor for use during the two-week breaks from triamcinolone . - Advise to massage the ointment into the skin for ten minutes during application. - Educate on the importance of avoiding picking and instead rubbing the affected area with medication or Aquaphor. - Provide a variety  of lotion samples to maintain skin hydration and advise to purchase a preferred lotion for regular use.     Return in about 6 months (around 03/04/2025) for lichen simplex.  I, Gordan Beams, CMA, am acting as scribe for [employer], DO.   Documentation: I have reviewed the above documentation for accuracy and completeness, and I agree with the above.  Delon Lenis, DO

## 2024-09-03 NOTE — Progress Notes (Signed)
 Psychiatric Initial Adult Assessment   Patient Identification: Roberta Miller MRN:  [id number] Date of Evaluation:  09/03/2024 Referral Source: self Chief Complaint:   Visit Diagnosis: Substance use disorder/bipolar  History of Present Illness:        Today the patient is seen with her niece whose name is [name].  The patient is doing very well.  She has not been agitated at all.  She has had no seizures.  She lives alone.  She functions on her own.  He drinks no alcohol at all.  She watches TV.  She cooks on her own.  Her niece says she is doing very well.  She is functioning great.  She takes her medicine as prescribed and as evidenced by the fact that she has a therapeutic Tegretol  blood level.  All her blood work was reviewed and was good.. Associated Signs/Symptoms: Depression Symptoms:  fatigue, (Hypo) Manic Symptoms:  Distractibility, Anxiety Symptoms:   Psychotic Symptoms:   PTSD Symptoms: NA  Past Psychiatric History:   Previous Psychotropic Medications: Yes   Substance Abuse History in the last 12 months:  No.  Consequences of Substance Abuse: NA  Past Medical History:  Past Medical History:  Diagnosis Date   Allergy    Anxiety    Bipolar disorder (HCC)    Fetal alcohol syndrome 1959   GERD (gastroesophageal reflux disease)    HEADACHE, TENSION 08/02/2007   Qualifier: Diagnosis of   By: Adella MD, Elizabeth         Hypertension    Nephrolithiasis 11/09/2011   CT abdomen 11/2011:  IMPRESSION:   1. Tiny (1-2 mm) non-obstructing calculi in the upper pole calyces   of the left kidney. No urinary tract calculi elsewhere on either   side.            Pancreatitis    PUD (peptic ulcer disease) 06/20/2016   Substance abuse (HCC)    alcohol and tobacco   Ulcer     Past Surgical History:  Procedure Laterality Date   ESOPHAGOGASTRODUODENOSCOPY N/A 06/21/2016   Procedure: ESOPHAGOGASTRODUODENOSCOPY (EGD);  Surgeon: Toribio SHAUNNA Cedar, MD;  Location: [facility] ENDOSCOPY;   Service: Endoscopy;  Laterality: N/A;    Family Psychiatric History:   Family History:  Family History  Problem Relation Age of Onset   Breast cancer Mother 39   Alcoholism Mother    Cancer - Ovarian Father    Hypertension Sister    Breast cancer Sister    Ovarian cancer Sister 71   Kidney disease Brother    Stomach cancer Brother 28   Diabetes Brother    Hypertension Brother    Heart failure Brother    Colon cancer Neg Hx    Colon polyps Neg Hx    Esophageal cancer Neg Hx    Prostate cancer Neg Hx    Rectal cancer Neg Hx     Social History:   Social History   Socioeconomic History   Marital status: Single    Spouse name: Not on file   Number of children: Not on file   Years of education: Not on file   Highest education level: Not on file  Occupational History   Not on file  Tobacco Use   Smoking status: Every Day    Current packs/day: 0.50    Average packs/day: 0.5 packs/day for 42.0 years (21.0 ttl pk-yrs)    Types: Cigarettes   Smokeless tobacco: Never   Tobacco comments:    1/2ppd   Vaping Use  Vaping status: Not on file  Substance and Sexual Activity   Alcohol use: Yes    Alcohol/week: 1.0 standard drink of alcohol    Types: 1 Cans of beer per week   Drug use: No    Comment: hx of cocaine use   Sexual activity: Never  Other Topics Concern   Not on file  Social History Narrative   Lives alone   Niece power of attorney   Gets SSI   Single         Social Drivers of Health   Financial Resource Strain: Low Risk  (01/18/2024)   Overall Financial Resource Strain (CARDIA)    Difficulty of Paying Living Expenses: Not hard at all  Food Insecurity: Food Insecurity Present (01/18/2024)   Hunger Vital Sign    Worried About Running Out of Food in the Last Year: Sometimes true    Ran Out of Food in the Last Year: Sometimes true  Transportation Needs: No Transportation Needs (01/18/2024)   PRAPARE - Administrator, Civil Service (Medical): No     Lack of Transportation (Non-Medical): No  Physical Activity: Sufficiently Active (01/18/2024)   Exercise Vital Sign    Days of Exercise per Week: 7 days    Minutes of Exercise per Session: 60 min  Stress: No Stress Concern Present (01/18/2024)   [employer] of Occupational Health - Occupational Stress Questionnaire    Feeling of Stress : Not at all  Social Connections: Socially Isolated (01/18/2024)   Social Connection and Isolation Panel    Frequency of Communication with Friends and Family: Twice a week    Frequency of Social Gatherings with Friends and Family: Once a week    Attends Religious Services: Never    Database Administrator or Organizations: No    Attends Banker Meetings: Never    Marital Status: Never married    Additional Social History:   Allergies:   Allergies  Allergen Reactions   Other Rash    Tomatoes and Chocolate     Metabolic Disorder Labs: Lab Results  Component Value Date   HGBA1C 5.1 11/17/2021   No results found for: PROLACTIN Lab Results  Component Value Date   CHOL 188 01/10/2024   TRIG 65 01/10/2024   HDL 70 01/10/2024   CHOLHDL 2.7 01/10/2024   VLDL 14 11/24/2009   LDLCALC 106 (H) 01/10/2024   LDLCALC 103 (H) 11/17/2021   Lab Results  Component Value Date   TSH 2.210 12/24/2019    Therapeutic Level Labs: No results found for: LITHIUM Lab Results  Component Value Date   CBMZ 6.7 04/04/2024   Lab Results  Component Value Date   VALPROATE <4 (L) 12/24/2019    Current Medications: Current Outpatient Medications  Medication Sig Dispense Refill   aspirin-acetaminophen -caffeine (EXCEDRIN  MIGRAINE) 250-250-65 MG tablet Take 2 tablets by mouth every 6 (six) hours as needed for headache. 30 tablet 0   fluticasone  (FLONASE ) 50 MCG/ACT nasal spray Place 1 spray into both nostrils daily. (Patient taking differently: Place 1 spray into both nostrils daily as needed.) 48 g 3   Olmesartan -amLODIPine -HCTZ  40-10-12.5 MG TABS Take 1 tablet by mouth daily. 30 tablet 1   rosuvastatin  (CRESTOR ) 10 MG tablet Take 1 tablet (10 mg total) by mouth daily. 90 tablet 3   triamcinolone  cream (KENALOG ) 0.1 % APPLY TO AFFECTED AREA TWICE A DAY 30 g 0   triamcinolone  ointment (KENALOG ) 0.1 % Apply 1 Application topically 2 (two) times daily as  needed. 180 g 3   varenicline  (CHANTIX ) 1 MG tablet Take 1 tablet (1 mg total) by mouth 2 (two) times daily. 120 tablet 1   carbamazepine  (TEGRETOL ) 200 MG tablet 2 bid 120 tablet 6   No current facility-administered medications for this visit.    Musculoskeletal: Strength & Muscle Tone: within normal limits Gait & Station: normal Patient leans: N/A  Psychiatric Specialty Exam: Review of Systems  Blood pressure (!) 139/90, pulse 62, height 5' 4 (1.626 m), weight 102 lb 9.6 oz (46.5 kg).Body mass index is 17.61 kg/m.  General Appearance: Casual  Eye Contact:  Good  Speech:  NA  Volume:  Normal  Mood:  Negative  Affect:  Appropriate  Thought Process:  Goal Directed  Orientation:  Full (Time, Place, and Person)  Thought Content:  Logical  Suicidal Thoughts:  No  Homicidal Thoughts:  No  Memory:  NA  Judgement:  Fair  Insight:  Fair  Psychomotor Activity:  Psychomotor Retardation  Concentration:    Recall:  Good  Fund of Knowledge:Good  Language: Fair  Akathisia:  No  Handed:  Right  AIMS (if indicated):  not done  Assets:  Desire for Improvement  ADL's:  Intact  Cognition: WNL  Sleep:  Good   Screenings: PHQ2-9    Flowsheet Row Office Visit from 05/16/2024 in [facility] - A Dept Of [place]. [facility] Clinical Support from 01/18/2024 in [facility] - A Dept Of [place]. [facility] Office Visit from 01[facility]10/2024 in [facility] - A Dept Of [place]. [facility] Office Visit from 12/06/2023 in [facility] - A Dept Of [place]. [facility] Office  Visit from 07/24/2023 in [facility] - A Dept Of [place]. 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  She has been taking Tegretol  for many years and done extremely well.  Will manage she does not drink she is not impulsive and she is not bothered her aggressive.  She lives on her own independently.  She takes care of all her basic ADLs as well as many of her instrumental ADLs.  Patient continue taking Tegretol  as prescribed.  She will return to see me in 6 months. Collaboration of Care:   Patient/Guardian was advised Release of Information must be obtained prior to any record release in order to collaborate their care with an outside provider. Patient/Guardian was advised if they have not already done so to contact the registration department to sign all necessary forms in order for us  to release information regarding their care.   Consent: Patient/Guardian gives verbal consent for treatment and assignment of benefits for services provided during this visit. Patient/Guardian expressed understanding and agreed to proceed.   Elna LILLETTE Lo, MD 10/28/20253:59 PM

## 2024-09-03 NOTE — Patient Instructions (Addendum)
 VISIT SUMMARY:  During today's visit, we discussed the itchy dark spots on your right arm that have been bothering you for several years. You mentioned that you have been out of your triamcinolone  cream for a month or two, which has led to increased discomfort. We reviewed your symptoms and developed a plan to help manage and improve your condition.  YOUR PLAN:  -LICHEN SIMPLEX CHRONICUS OF RIGHT ARM:  Lichen simplex chronicus is a skin condition caused by habitual picking or rubbing, leading to thickened and darkened skin with intermittent itching.  To manage this, you will use triamcinolone  0.1% ointment twice daily for two weeks, followed by a two-week break. You will alternate between triamcinolone  and Aquaphor every two weeks for six months.  Massage the ointment into your skin for ten minutes during application. Avoid picking at the spots and instead rub the affected area with medication or Aquaphor. Keep your skin hydrated with lotion, and we provided samples for you to try.  INSTRUCTIONS:  Please follow the treatment plan as discussed. Use triamcinolone  0.1% ointment twice daily for two weeks, then take a two-week break and use Aquaphor instead.  Continue this cycle for six months. Massage the ointment into your skin for ten minutes during each application.  Avoid picking at the spots and keep your skin hydrated with lotion. We provided samples for you to try and recommend purchasing a preferred lotion for regular use.     Important Information  Due to recent changes in healthcare laws, you may see results of your pathology and/or laboratory studies on MyChart before the doctors have had a chance to review them. We understand that in some cases there may be results that are confusing or concerning to you. Please understand that not all results are received at the same time and often the doctors may need to interpret multiple results in order to provide you with the best plan of care  or course of treatment. Therefore, we ask that you please give us  2 business days to thoroughly review all your results before contacting the office for clarification. Should we see a critical lab result, you will be contacted sooner.   If You Need Anything After Your Visit  If you have any questions or concerns for your doctor, please call our main line at [phone] If no one answers, please leave a voicemail as directed and we will return your call as soon as possible. Messages left after 4 pm will be answered the following business day.   You may also send us  a message via MyChart. We typically respond to MyChart messages within 1-2 business days.  For prescription refills, please ask your pharmacy to contact our office. Our fax number is [phone].  If you have an urgent issue when the clinic is closed that cannot wait until the next business day, you can page your doctor at the number below.    Please note that while we do our best to be available for urgent issues outside of office hours, we are not available 24/7.   If you have an urgent issue and are unable to reach us , you may choose to seek medical care at your doctor's office, retail clinic, urgent care center, or emergency room.  If you have a medical emergency, please immediately call 911 or go to the emergency department. In the event of inclement weather, please call our main line at [phone] for an update on the status of any delays or closures.  Dermatology Medication Tips:  Please keep the boxes that topical medications come in in order to help keep track of the instructions about where and how to use these. Pharmacies typically print the medication instructions only on the boxes and not directly on the medication tubes.   If your medication is too expensive, please contact our office at [phone] or send us  a message through MyChart.   We are unable to tell what your co-pay for medications will be in advance as  this is different depending on your insurance coverage. However, we may be able to find a substitute medication at lower cost or fill out paperwork to get insurance to cover a needed medication.   If a prior authorization is required to get your medication covered by your insurance company, please allow us  1-2 business days to complete this process.  Drug prices often vary depending on where the prescription is filled and some pharmacies may offer cheaper prices.  The website www.goodrx.com contains coupons for medications through different pharmacies. The prices here do not account for what the cost may be with help from insurance (it may be cheaper with your insurance), but the website can give you the price if you did not use any insurance.  - You can print the associated coupon and take it with your prescription to the pharmacy.  - You may also stop by our office during regular business hours and pick up a GoodRx coupon card.  - If you need your prescription sent electronically to a different pharmacy, notify our office through [facility] or by phone at [phone]

## 2024-09-06 ENCOUNTER — Other Ambulatory Visit: Payer: Self-pay | Admitting: Student

## 2024-09-06 DIAGNOSIS — F17200 Nicotine dependence, unspecified, uncomplicated: Secondary | ICD-10-CM

## 2024-10-01 ENCOUNTER — Other Ambulatory Visit

## 2024-10-16 ENCOUNTER — Ambulatory Visit (HOSPITAL_COMMUNITY): Admitting: Psychiatry

## 2025-03-04 ENCOUNTER — Ambulatory Visit (HOSPITAL_COMMUNITY): Admitting: Psychiatry

## 2025-03-04 ENCOUNTER — Ambulatory Visit: Admitting: Dermatology
